# Patient Record
Sex: Female | Born: 1990
Health system: Southern US, Community
[De-identification: ages and names within clinical notes are randomized; demographics above are authoritative.]

## PROBLEM LIST (undated history)

## (undated) DIAGNOSIS — I300 Acute nonspecific idiopathic pericarditis: Secondary | ICD-10-CM

## (undated) DIAGNOSIS — I38 Endocarditis, valve unspecified: Secondary | ICD-10-CM

## (undated) DIAGNOSIS — M329 Systemic lupus erythematosus, unspecified: Secondary | ICD-10-CM

## (undated) DIAGNOSIS — J90 Pleural effusion, not elsewhere classified: Secondary | ICD-10-CM

## (undated) DIAGNOSIS — I3139 Other pericardial effusion (noninflammatory): Secondary | ICD-10-CM

## (undated) HISTORY — DX: Pleural effusion, not elsewhere classified: J90

## (undated) HISTORY — DX: Other pericardial effusion (noninflammatory): I31.39

## (undated) HISTORY — DX: Acute nonspecific idiopathic pericarditis: I30.0

## (undated) HISTORY — DX: Systemic lupus erythematosus, unspecified: M32.9

## (undated) HISTORY — DX: Endocarditis, valve unspecified: I38

---
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
## 2009-05-22 ENCOUNTER — Inpatient Hospital Stay (HOSPITAL_COMMUNITY): Admission: AD | Admit: 2009-05-22 | Discharge: 2009-05-23 | Payer: Self-pay | Admitting: Obstetrics and Gynecology

## 2009-06-21 ENCOUNTER — Inpatient Hospital Stay (HOSPITAL_COMMUNITY): Admission: AD | Admit: 2009-06-21 | Discharge: 2009-06-23 | Payer: Self-pay | Admitting: Obstetrics and Gynecology

## 2010-05-28 LAB — CBC
HCT: 34.8 % — ABNORMAL LOW (ref 36.0–46.0)
Hemoglobin: 11.8 g/dL — ABNORMAL LOW (ref 12.0–15.0)
MCHC: 33.9 g/dL (ref 30.0–36.0)
MCV: 82.4 fL (ref 78.0–100.0)
RBC: 4.22 MIL/uL (ref 3.87–5.11)

## 2010-05-29 LAB — CBC
MCHC: 32.9 g/dL (ref 30.0–36.0)
MCV: 81.8 fL (ref 78.0–100.0)
Platelets: 215 10*3/uL (ref 150–400)
RBC: 4.84 MIL/uL (ref 3.87–5.11)

## 2010-05-29 LAB — RPR: RPR Ser Ql: NONREACTIVE

## 2010-06-03 LAB — URINALYSIS, ROUTINE W REFLEX MICROSCOPIC
Bilirubin Urine: NEGATIVE
Ketones, ur: NEGATIVE mg/dL
Nitrite: NEGATIVE
pH: 7 (ref 5.0–8.0)

## 2010-06-03 LAB — URINE CULTURE: Colony Count: 8000

## 2010-06-03 LAB — URINE MICROSCOPIC-ADD ON

## 2010-06-14 LAB — POCT PREGNANCY, URINE: Preg Test, Ur: POSITIVE

## 2011-02-12 ENCOUNTER — Emergency Department (HOSPITAL_COMMUNITY)
Admission: EM | Admit: 2011-02-12 | Discharge: 2011-02-12 | Discharge status: Against Medical Advice | Payer: Medicaid Other | Attending: Emergency Medicine | Admitting: Emergency Medicine

## 2011-02-12 ENCOUNTER — Encounter: Payer: Self-pay | Admitting: Emergency Medicine

## 2011-02-12 DIAGNOSIS — M25559 Pain in unspecified hip: Secondary | ICD-10-CM | POA: Insufficient documentation

## 2011-02-12 NOTE — ED Notes (Addendum)
Pt reports that she purposefully sat on left foot yesterday and heard her left hip make a "pop" noise. Pt reports she has a limp today and 8/10 pain in left anterior inguinal area. Pt is ambulatory WNL. No apparent distress.

## 2011-02-14 NOTE — ED Provider Notes (Signed)
History     CSN: [id number] Arrival date & time: 02/12/2011  3:50 PM   First MD Initiated Contact with Patient 02/12/11 1823      Chief Complaint  Patient presents with  . Hip Pain    (Consider location/radiation/quality/duration/timing/severity/associated sxs/prior treatment) HPI  History reviewed. No pertinent past medical history.  History reviewed. No pertinent past surgical history.  No family history on file.  History  Substance Use Topics  . Smoking status: Never Smoker   . Smokeless tobacco: Not on file  . Alcohol Use: No    OB History    Grav Para Term Preterm Abortions TAB SAB Ect Mult Living                  Review of Systems  Allergies  Review of patient's allergies indicates no known allergies.  Home Medications   Current Outpatient Rx  Name Route Sig Dispense Refill  . ACETAMINOPHEN 325 MG PO TABS Oral Take 650 mg by mouth every 6 (six) hours as needed. For pain.     . NORELGESTROMIN-ETH ESTRADIOL 150-20 MCG/24HR TD PTWK Transdermal Place 1 patch onto the skin once a week.        BP 111/65  Pulse 81  Temp(Src) 98.3 F (36.8 C) (Oral)  Resp 17  SpO2 99%  LMP 01/25/2011  Physical Exam  ED Course  Procedures (including critical care time)  Labs Reviewed - No data to display No results found.   1. Hip pain       MDM  Left before seen         Arman Filter, NP 02/14/11 4098  Arman Filter, NP 02/14/11 [phone]

## 2011-02-17 NOTE — ED Provider Notes (Signed)
Medical screening examination/treatment/procedure(s) were performed by non-physician practitioner and as supervising physician I was immediately available for consultation/collaboration.  Olivia Mackie, MD 02/17/11 [phone]

## 2012-07-22 ENCOUNTER — Encounter (HOSPITAL_BASED_OUTPATIENT_CLINIC_OR_DEPARTMENT_OTHER): Payer: Self-pay

## 2012-07-22 ENCOUNTER — Emergency Department (HOSPITAL_BASED_OUTPATIENT_CLINIC_OR_DEPARTMENT_OTHER)
Admission: EM | Admit: 2012-07-22 | Discharge: 2012-07-22 | Disposition: A | Discharge status: Home / Self Care | Payer: Medicaid Other | Attending: Emergency Medicine | Admitting: Emergency Medicine

## 2012-07-22 DIAGNOSIS — Z3202 Encounter for pregnancy test, result negative: Secondary | ICD-10-CM | POA: Insufficient documentation

## 2012-07-22 DIAGNOSIS — N390 Urinary tract infection, site not specified: Secondary | ICD-10-CM | POA: Insufficient documentation

## 2012-07-22 LAB — URINE MICROSCOPIC-ADD ON

## 2012-07-22 LAB — URINALYSIS, ROUTINE W REFLEX MICROSCOPIC
Bilirubin Urine: NEGATIVE
Glucose, UA: NEGATIVE mg/dL
Hgb urine dipstick: NEGATIVE
Ketones, ur: NEGATIVE mg/dL
pH: 6 (ref 5.0–8.0)

## 2012-07-22 MED ORDER — CEPHALEXIN 500 MG PO CAPS: 500.0000 mg | ORAL_CAPSULE | Freq: Four times a day (QID) | ORAL | Status: DC

## 2012-07-22 NOTE — ED Provider Notes (Signed)
Medical screening examination/treatment/procedure(s) were performed by non-physician practitioner and as supervising physician I was immediately available for consultation/collaboration.   Gwyneth Sprout, MD 07/22/12 1929

## 2012-07-22 NOTE — ED Provider Notes (Signed)
History     CSN: [id number]  Arrival date & time 07/22/12  1634   First MD Initiated Contact with Patient 07/22/12 1638      Chief Complaint  Patient presents with  . Abdominal Pain    (Consider location/radiation/quality/duration/timing/severity/associated sxs/prior treatment) HPI Comments: Urinary frequency  Patient is a 22 y.o. female presenting with abdominal pain. The history is provided by the patient. No language interpreter was used.  Abdominal Pain Pain location:  Suprapubic Pain quality: aching   Pain radiates to:  Does not radiate Pain severity:  Mild Onset quality:  Gradual Duration:  2 days Timing:  Constant Progression:  Unchanged Chronicity:  New Relieved by:  Nothing Worsened by:  Nothing tried Associated symptoms: no hematuria, no vaginal bleeding and no vaginal discharge     History reviewed. No pertinent past medical history.  History reviewed. No pertinent past surgical history.  No family history on file.  History  Substance Use Topics  . Smoking status: Never Smoker   . Smokeless tobacco: Not on file  . Alcohol Use: No    OB History   Grav Para Term Preterm Abortions TAB SAB Ect Mult Living                  Review of Systems  Constitutional: Negative.   Respiratory: Negative.   Cardiovascular: Negative.   Gastrointestinal: Positive for abdominal pain.  Genitourinary: Negative for hematuria, vaginal bleeding and vaginal discharge.    Allergies  Review of patient's allergies indicates no known allergies.  Home Medications   Current Outpatient Rx  Name  Route  Sig  Dispense  Refill  . acetaminophen (TYLENOL) 325 MG tablet   Oral   Take 650 mg by mouth every 6 (six) hours as needed. For pain.          Marland Kitchen norelgestromin-ethinyl estradiol (ORTHO EVRA) 150-20 MCG/24HR transdermal patch   Transdermal   Place 1 patch onto the skin once a week.             BP 131/70  Pulse 74  Temp(Src) 99 F (37.2 C) (Oral)  Resp 16  Ht  5\' 6"  (1.676 m)  Wt 155 lb (70.308 kg)  BMI 25.03 kg/m2  SpO2 100%  LMP 07/04/2012  Physical Exam  Nursing note and vitals reviewed. Constitutional: She is oriented to person, place, and time. She appears well-developed and well-nourished.  Cardiovascular: Normal rate and regular rhythm.   Pulmonary/Chest: Effort normal and breath sounds normal.  Abdominal: Soft. Bowel sounds are normal. There is tenderness in the suprapubic area.  Musculoskeletal: Normal range of motion.  Neurological: She is alert and oriented to person, place, and time.  Skin: Skin is dry.    ED Course  Procedures (including critical care time)  Labs Reviewed  URINALYSIS, ROUTINE W REFLEX MICROSCOPIC - Abnormal; Notable for the following:    APPearance CLOUDY (*)    Leukocytes, UA SMALL (*)    All other components within normal limits  URINE MICROSCOPIC-ADD ON - Abnormal; Notable for the following:    Squamous Epithelial / LPF MANY (*)    Bacteria, UA MANY (*)    All other components within normal limits  PREGNANCY, URINE   No results found.   1. UTI (lower urinary tract infection)       MDM  Will treat for a simple uti        Teressa Lower, NP 07/22/12 1722

## 2012-07-22 NOTE — ED Notes (Signed)
C/o lower abd pain x 2 days-urinary freq

## 2012-07-23 LAB — URINE CULTURE

## 2013-08-21 ENCOUNTER — Encounter (HOSPITAL_BASED_OUTPATIENT_CLINIC_OR_DEPARTMENT_OTHER): Payer: Self-pay | Admitting: Emergency Medicine

## 2013-08-21 ENCOUNTER — Emergency Department (HOSPITAL_BASED_OUTPATIENT_CLINIC_OR_DEPARTMENT_OTHER)
Admission: EM | Admit: 2013-08-21 | Discharge: 2013-08-21 | Disposition: A | Discharge status: Home / Self Care | Payer: Medicaid Other | Attending: Emergency Medicine | Admitting: Emergency Medicine

## 2013-08-21 DIAGNOSIS — Z792 Long term (current) use of antibiotics: Secondary | ICD-10-CM | POA: Insufficient documentation

## 2013-08-21 DIAGNOSIS — M545 Low back pain, unspecified: Secondary | ICD-10-CM | POA: Insufficient documentation

## 2013-08-21 MED ORDER — MELOXICAM 7.5 MG PO TABS: 7.5000 mg | ORAL_TABLET | Freq: Every day | ORAL | Status: DC

## 2013-08-21 MED ORDER — IBUPROFEN 200 MG PO TABS
600.0000 mg | ORAL_TABLET | Freq: Once | ORAL | Status: AC
  Administered 2013-08-21: 600 mg via ORAL
  Filled 2013-08-21 (×2): qty 1

## 2013-08-21 MED ORDER — CYCLOBENZAPRINE HCL 5 MG PO TABS: 5.0000 mg | ORAL_TABLET | Freq: Three times a day (TID) | ORAL | Status: DC | PRN

## 2013-08-21 NOTE — ED Notes (Signed)
Pt awoke this AM with back pain denies event or injury

## 2013-08-21 NOTE — Discharge Instructions (Signed)
Take the medication as directed. Do not take the muscle relaxant if you are driving or working as it will make you sleepy.

## 2013-08-21 NOTE — ED Provider Notes (Signed)
CSN: [id number]     Arrival date & time 08/21/13  2034 History   First MD Initiated Contact with Patient 08/21/13 2219     Chief Complaint  Patient presents with  . Back Pain     (Consider location/radiation/quality/duration/timing/severity/associated sxs/prior Treatment) Patient is a 23 y.o. female presenting with back pain. The history is provided by the patient.  Back Pain Location:  Lumbar spine Quality:  Aching Pain severity:  Moderate Pain is:  Same all the time Onset quality:  Gradual Duration:  1 day Timing:  Constant Progression:  Worsening Chronicity:  New Relieved by:  Being still and OTC medications Worsened by:  Ambulation, movement and bending Associated symptoms: no bladder incontinence and no bowel incontinence    Tiffany Herrera is a 23 y.o. female who presents to the ED with low back pain that she first noticed last night. Today the pain has increased. She states that since she was pregnant and had an epidural she has had some pain off and on. She usually just takes tylenol and the pain goes away. This time she took tylenol and the pain got better for a while but then came back.   History reviewed. No pertinent past medical history. History reviewed. No pertinent past surgical history. History reviewed. No pertinent family history. History  Substance Use Topics  . Smoking status: Never Smoker   . Smokeless tobacco: Not on file  . Alcohol Use: No   OB History   Grav Para Term Preterm Abortions TAB SAB Ect Mult Living                 Review of Systems  Gastrointestinal: Negative for bowel incontinence.  Genitourinary: Negative for bladder incontinence.  Musculoskeletal: Positive for back pain.  All other systems negative    Allergies  Review of patient's allergies indicates no known allergies.  Home Medications   Prior to Admission medications   Medication Sig Start Date End Date Taking? Authorizing Provider  acetaminophen (TYLENOL) 325 MG  tablet Take 650 mg by mouth every 6 (six) hours as needed. For pain.     Historical Provider, MD  cephALEXin (KEFLEX) 500 MG capsule Take 1 capsule (500 mg total) by mouth 4 (four) times daily. 07/22/12   Teressa LowerVrinda Pickering, NP  norelgestromin-ethinyl estradiol (ORTHO EVRA) 150-20 MCG/24HR transdermal patch Place 1 patch onto the skin once a week.      Historical Provider, MD   BP 104/69  Pulse 62  Temp(Src) 98 F (36.7 C) (Oral)  Resp 18  SpO2 95%  LMP 08/18/2013 Physical Exam  Nursing note and vitals reviewed. Constitutional: She is oriented to person, place, and time. She appears well-developed and well-nourished. No distress.  HENT:  Head: Normocephalic and atraumatic.  Right Ear: Tympanic membrane normal.  Left Ear: Tympanic membrane normal.  Nose: Nose normal.  Mouth/Throat: Uvula is midline, oropharynx is clear and moist and mucous membranes are normal.  Eyes: EOM are normal.  Neck: Normal range of motion. Neck supple.  Cardiovascular: Normal rate and regular rhythm.   Pulmonary/Chest: Effort normal. She has no wheezes. She has no rales.  Abdominal: Soft. Bowel sounds are normal. There is no tenderness.  Musculoskeletal: Normal range of motion.       Lumbar back: She exhibits tenderness (mild) and spasm. She exhibits normal range of motion and normal pulse.  Pedal pulses strong and equal bilateral. Full range of motion of back without difficulty. Ambulatory with steady gait, no foot drag.   Neurological: She is  alert and oriented to person, place, and time. She has normal strength. No cranial nerve deficit or sensory deficit. Gait normal.  Reflex Scores:      Bicep reflexes are 2+ on the right side and 2+ on the left side.      Brachioradialis reflexes are 2+ on the right side and 2+ on the left side.      Patellar reflexes are 2+ on the right side and 2+ on the left side.      Achilles reflexes are 2+ on the right side and 2+ on the left side. Skin: Skin is warm and dry.   Psychiatric: She has a normal mood and affect. Her behavior is normal.    ED Course  Procedures   MDM  23 y.o. female with low back pain that started last night. Stable for discharge without neuro deficits. Work note given for work missed today. She will follow up with ortho is symptoms worsen.  Discussed with the patient and all questioned fully answered. She voices understanding.    Medication List    TAKE these medications       cyclobenzaprine 5 MG tablet  Commonly known as:  FLEXERIL  Take 1 tablet (5 mg total) by mouth 3 (three) times daily as needed for muscle spasms.     meloxicam 7.5 MG tablet  Commonly known as:  MOBIC  Take 1 tablet (7.5 mg total) by mouth daily.      ASK your doctor about these medications       acetaminophen 325 MG tablet  Commonly known as:  TYLENOL  Take 650 mg by mouth every 6 (six) hours as needed. For pain.     cephALEXin 500 MG capsule  Commonly known as:  KEFLEX  Take 1 capsule (500 mg total) by mouth 4 (four) times daily.     norelgestromin-ethinyl estradiol 150-35 MCG/24HR transdermal patch  Commonly known as:  ORTHO EVRA  Place 1 patch onto the skin once a week.         Janne NapoleonHope M Rhetta Cleek, [place]NP 08/21/13 2241

## 2013-08-22 NOTE — ED Provider Notes (Signed)
Medical screening examination/treatment/procedure(s) were performed by non-physician practitioner and as supervising physician I was immediately available for consultation/collaboration.   EKG Interpretation None        Tiffany Herrera S Sidhant Helderman, MD 08/22/13 1522 

## 2014-07-12 ENCOUNTER — Encounter (HOSPITAL_BASED_OUTPATIENT_CLINIC_OR_DEPARTMENT_OTHER): Payer: Self-pay | Admitting: *Deleted

## 2014-07-12 ENCOUNTER — Emergency Department (HOSPITAL_BASED_OUTPATIENT_CLINIC_OR_DEPARTMENT_OTHER)
Admission: EM | Admit: 2014-07-12 | Discharge: 2014-07-13 | Disposition: A | Discharge status: Home / Self Care | Payer: Medicaid Other | Attending: Emergency Medicine | Admitting: Emergency Medicine

## 2014-07-12 DIAGNOSIS — M545 Low back pain, unspecified: Secondary | ICD-10-CM

## 2014-07-12 DIAGNOSIS — Y92410 Unspecified street and highway as the place of occurrence of the external cause: Secondary | ICD-10-CM | POA: Insufficient documentation

## 2014-07-12 DIAGNOSIS — Y9389 Activity, other specified: Secondary | ICD-10-CM | POA: Insufficient documentation

## 2014-07-12 DIAGNOSIS — Y998 Other external cause status: Secondary | ICD-10-CM | POA: Insufficient documentation

## 2014-07-12 DIAGNOSIS — S3992XA Unspecified injury of lower back, initial encounter: Secondary | ICD-10-CM | POA: Insufficient documentation
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
## 2014-07-13 MED ORDER — IBUPROFEN 600 MG PO TABS: 600.0000 mg | ORAL_TABLET | Freq: Four times a day (QID) | ORAL | Status: DC | PRN

## 2014-07-13 MED ORDER — HYDROCODONE-ACETAMINOPHEN 5-325 MG PO TABS
1.0000 | ORAL_TABLET | Freq: Once | ORAL | Status: AC
Start: 2014-07-13 — End: 2014-07-13
  Administered 2014-07-13: 1 via ORAL
  Filled 2014-07-13: qty 1

## 2014-07-13 MED ORDER — HYDROCODONE-ACETAMINOPHEN 5-325 MG PO TABS: 1.0000 | ORAL_TABLET | Freq: Four times a day (QID) | ORAL | Status: DC | PRN

## 2014-07-13 NOTE — Discharge Instructions (Signed)

## 2014-07-26 ENCOUNTER — Encounter (HOSPITAL_BASED_OUTPATIENT_CLINIC_OR_DEPARTMENT_OTHER): Payer: Self-pay

## 2014-07-26 ENCOUNTER — Emergency Department (HOSPITAL_BASED_OUTPATIENT_CLINIC_OR_DEPARTMENT_OTHER)
Admission: EM | Admit: 2014-07-26 | Discharge: 2014-07-26 | Disposition: A | Discharge status: Home / Self Care | Payer: Medicaid Other | Attending: Emergency Medicine | Admitting: Emergency Medicine

## 2014-07-26 DIAGNOSIS — L089 Local infection of the skin and subcutaneous tissue, unspecified: Secondary | ICD-10-CM | POA: Insufficient documentation

## 2014-07-26 MED ORDER — CEPHALEXIN 500 MG PO CAPS: 500.0000 mg | ORAL_CAPSULE | Freq: Four times a day (QID) | ORAL | Status: DC

## 2014-07-26 NOTE — Discharge Instructions (Signed)
Rash A rash is a change in the color or feel of your skin. There are many different types of rashes. You may have other problems along with your rash. HOME CARE  Avoid the thing that caused your rash.  Do not scratch your rash.  You may take cools baths to help stop itching.  Only take medicines as told by your doctor.  Keep all doctor visits as told. GET HELP RIGHT AWAY IF:   Your pain, puffiness (swelling), or redness gets worse.  You have a fever.  You have new or severe problems.  You have body aches, watery poop (diarrhea), or you throw up (vomit).  Your rash is not better after 3 days. MAKE SURE YOU:   Understand these instructions.  Will watch your condition.  Will get help right away if you are not doing well or get worse. Document Released: 08/13/2007 Document Revised: 05/19/2011 Document Reviewed: 12/09/2010 Exit[employer] Patient Information 2015 [employer]. This information is not intended to replace advice given to you by your health care provider. Make sure you discuss any questions you have with your health care provider.  

## 2014-07-26 NOTE — ED Provider Notes (Signed)
CSN: [id number]     Arrival date & time 07/26/14  1717 History   First MD Initiated Contact with Patient 07/26/14 1721     Chief Complaint  Patient presents with  . Rash     (Consider location/radiation/quality/duration/timing/severity/associated sxs/prior Treatment) HPI Comments: Pt complaining of rash to the back of legs bilaterally. Started several day ago and the area on the left leg is starting to get red a hot although the left leg seems to be getting better. No drainage. No fever.   The history is provided by the patient. No language interpreter was used.    History reviewed. No pertinent past medical history. History reviewed. No pertinent past surgical history. No family history on file. History  Substance Use Topics  . Smoking status: Never Smoker   . Smokeless tobacco: Not on file  . Alcohol Use: No   OB History    No data available     Review of Systems  All other systems reviewed and are negative.     Allergies  Review of patient's allergies indicates no known allergies.  Home Medications   Prior to Admission medications   Medication Sig Start Date End Date Taking? Authorizing Provider  acetaminophen (TYLENOL) 325 MG tablet Take 650 mg by mouth every 6 (six) hours as needed. For pain.     Historical Provider, MD  cephALEXin (KEFLEX) 500 MG capsule Take 1 capsule (500 mg total) by mouth 4 (four) times daily. 07/26/14   Teressa LowerVrinda Nikko Goldwire, NP  HYDROcodone-acetaminophen (NORCO/VICODIN) 5-325 MG per tablet Take 1 tablet by mouth every 6 (six) hours as needed for moderate pain. 07/13/14   [name] Horton, MD  ibuprofen (ADVIL,MOTRIN) 600 MG tablet Take 1 tablet (600 mg total) by mouth every 6 (six) hours as needed. 07/13/14   [name] Horton, MD   BP 113/70 mmHg  Pulse 63  Temp(Src) 98.2 F (36.8 C) (Oral)  Resp 14  Ht 5\' 6"  (1.676 m)  Wt 186 lb (84.369 kg)  BMI 30.04 kg/m2  SpO2 100%  LMP 07/19/2014 Physical Exam  Constitutional: She is oriented to  person, place, and time. She appears well-developed and well-nourished.  Cardiovascular: Normal rate and regular rhythm.   Pulmonary/Chest: Effort normal and breath sounds normal.  Abdominal: Soft. Bowel sounds are normal. There is no tenderness.  Musculoskeletal: Normal range of motion.  Neurological: She is alert and oriented to person, place, and time.  Skin:  Area of redness and warmth noted to the right upper lower leg. One spot noted on the left leg. No fluctuance of firmness.  Psychiatric: She has a normal mood and affect.  Nursing note and vitals reviewed.   ED Course  Procedures (including critical care time) Labs Review Labs Reviewed - No data to display  Imaging Review No results found.   EKG Interpretation None      MDM   Final diagnoses:  Skin infection    Pt instructed on use of benadryl. Given keflex for possible early infection. No definite abscess noted    Teressa LowerVrinda Loui [place], NP 07/26/14 1749  Richardean Canalavid H Yao, MD 07/26/14 [phone]

## 2014-07-26 NOTE — ED Notes (Signed)
Reports rash to back of bilateral legs

## 2014-09-19 ENCOUNTER — Encounter (HOSPITAL_BASED_OUTPATIENT_CLINIC_OR_DEPARTMENT_OTHER): Payer: Self-pay | Admitting: Emergency Medicine

## 2014-09-19 ENCOUNTER — Emergency Department (HOSPITAL_BASED_OUTPATIENT_CLINIC_OR_DEPARTMENT_OTHER)
Admission: EM | Admit: 2014-09-19 | Discharge: 2014-09-19 | Disposition: A | Discharge status: Home / Self Care | Payer: Medicaid Other | Attending: Emergency Medicine | Admitting: Emergency Medicine

## 2014-09-19 DIAGNOSIS — Z792 Long term (current) use of antibiotics: Secondary | ICD-10-CM | POA: Insufficient documentation

## 2014-09-19 DIAGNOSIS — L509 Urticaria, unspecified: Secondary | ICD-10-CM | POA: Insufficient documentation

## 2014-09-19 MED ORDER — PREDNISONE 10 MG PO TABS: 20.0000 mg | ORAL_TABLET | Freq: Two times a day (BID) | ORAL | Status: DC

## 2014-09-19 NOTE — ED Notes (Signed)
Patient states that she has had an outbreak of a rash in different areas since Thursday. Reports that it goes away intermittently and then pops up again in another spot on her body. Today she is concerned because it is on her lip

## 2014-09-19 NOTE — ED Provider Notes (Signed)
CSN: [id number]     Arrival date & time 09/19/14  1823 History  This chart was scribed for Geoffery Lyons, MD by Abel Presto, ED Scribe. This patient was seen in room MH12/MH12 and the patient's care was started at 6:56 PM.    Chief Complaint  Patient presents with  . Rash     The history is provided by the patient. No language interpreter was used.   HPI Comments: Tiffany Herrera is a 24 y.o. female who presents to the Emergency Department complaining of intermittent pruritic urticaria-like rash with first onset 5 days ago. She states rash occurs in different places each time. She reports mild globus sensation with PO intake. Pt has tried Benadryl with brief relief. She denies suspicious food intake, changes in detergents or soaps, and new medication. Pt denies SOB and difficulty swallowing.   History reviewed. No pertinent past medical history. History reviewed. No pertinent past surgical history. History reviewed. No pertinent family history. History  Substance Use Topics  . Smoking status: Never Smoker   . Smokeless tobacco: Not on file  . Alcohol Use: No   OB History    No data available     Review of Systems  Skin: Positive for rash.  A complete 10 system review of systems was obtained and all systems are negative except as noted in the HPI and PMH.      Allergies  Review of patient's allergies indicates no known allergies.  Home Medications   Prior to Admission medications   Medication Sig Start Date End Date Taking? Authorizing Provider  acetaminophen (TYLENOL) 325 MG tablet Take 650 mg by mouth every 6 (six) hours as needed. For pain.     Historical Provider, MD  cephALEXin (KEFLEX) 500 MG capsule Take 1 capsule (500 mg total) by mouth 4 (four) times daily. 07/26/14   Teressa Lower, NP  HYDROcodone-acetaminophen (NORCO/VICODIN) 5-325 MG per tablet Take 1 tablet by mouth every 6 (six) hours as needed for moderate pain. 07/13/14   Shon Baton, MD  ibuprofen  (ADVIL,MOTRIN) 600 MG tablet Take 1 tablet (600 mg total) by mouth every 6 (six) hours as needed. 07/13/14   Shon Baton, MD   BP 124/77 mmHg  Pulse 78  Temp(Src) 98.9 F (37.2 C) (Oral)  Resp 16  Ht  (1.676 m)  Wt 188 lb (85.276 kg)  BMI 30.36 kg/m2  SpO2 100%  LMP 09/10/2014 Physical Exam  Constitutional: She is oriented to person, place, and time. She appears well-developed and well-nourished. No distress.  HENT:  Head: Normocephalic and atraumatic.  Eyes: EOM are normal.  Neck: Normal range of motion.  Cardiovascular: Normal rate, regular rhythm and normal heart sounds.   Pulmonary/Chest: Effort normal and breath sounds normal. No respiratory distress. She has no wheezes.  Abdominal: Soft. She exhibits no distension. There is no tenderness.  Musculoskeletal: Normal range of motion.  Neurological: She is alert and oriented to person, place, and time.  Skin: Skin is warm and dry.  There are multiple urticarial lesions to the arms, legs, and torso  Psychiatric: She has a normal mood and affect. Judgment normal.  Nursing note and vitals reviewed.   ED Course  Procedures (including critical care time) DIAGNOSTIC STUDIES: Oxygen Saturation is 100% on room air, normal by my interpretation.    COORDINATION OF CARE: 7:03 PM Discussed treatment plan with patient at beside, the patient agrees with the plan and has no further questions at this time.   Labs Review  Labs Reviewed - No data to display  Imaging Review No results found.   EKG Interpretation None      MDM   Final diagnoses:  Urticaria    Urticaria. We'll treat with prednisone and Benadryl. To return as needed.   I personally performed the services described in this documentation, which was scribed in my presence. The recorded information has been reviewed and is accurate.      Geoffery Lyonsouglas Alizee Maple, MD 09/19/14 [phone]

## 2014-09-19 NOTE — Discharge Instructions (Signed)
°  Prednisone as prescribed.  Benadryl 25 mg every 6 hours for the next 3 days.  Return to the emergency department if he developed difficulty breathing, swallowing, or other new and concerning symptoms.   Hives Hives are itchy, red, swollen areas of the skin. They can vary in size and location on your body. Hives can come and go for hours or several days (acute hives) or for several weeks (chronic hives). Hives do not spread from person to person (noncontagious). They may get worse with scratching, exercise, and emotional stress. CAUSES   Allergic reaction to food, additives, or drugs.  Infections, including the common cold.  Illness, such as vasculitis, lupus, or thyroid disease.  Exposure to sunlight, heat, or cold.  Exercise.  Stress.  Contact with chemicals. SYMPTOMS   Red or white swollen patches on the skin. The patches may change size, shape, and location quickly and repeatedly.  Itching.  Swelling of the hands, feet, and face. This may occur if hives develop deeper in the skin. DIAGNOSIS  Your caregiver can usually tell what is wrong by performing a physical exam. Skin or blood tests may also be done to determine the cause of your hives. In some cases, the cause cannot be determined. TREATMENT  Mild cases usually get better with medicines such as antihistamines. Severe cases may require an emergency epinephrine injection. If the cause of your hives is known, treatment includes avoiding that trigger.  HOME CARE INSTRUCTIONS   Avoid causes that trigger your hives.  Take antihistamines as directed by your caregiver to reduce the severity of your hives. Non-sedating or low-sedating antihistamines are usually recommended. Do not drive while taking an antihistamine.  Take any other medicines prescribed for itching as directed by your caregiver.  Wear loose-fitting clothing.  Keep all follow-up appointments as directed by your caregiver. SEEK MEDICAL CARE IF:   You  have persistent or severe itching that is not relieved with medicine.  You have painful or swollen joints. SEEK IMMEDIATE MEDICAL CARE IF:   You have a fever.  Your tongue or lips are swollen.  You have trouble breathing or swallowing.  You feel tightness in the throat or chest.  You have abdominal pain. These problems may be the first sign of a life-threatening allergic reaction. Call your local emergency services (911 in [place] MAKE SURE YOU:   Understand these instructions.  Will watch your condition.  Will get help right away if you are not doing well or get worse. Document Released: 02/24/2005 Document Revised: 03/01/2013 Document Reviewed: 05/20/2011 [facility] Patient Information 2015 [place]Care, [place]. This information is not intended to replace advice given to you by your health care provider. Make sure you discuss any questions you have with your health care provider.

## 2014-10-02 ENCOUNTER — Emergency Department (HOSPITAL_COMMUNITY)
Admission: EM | Admit: 2014-10-02 | Discharge: 2014-10-02 | Disposition: A | Discharge status: Home / Self Care | Payer: Medicaid Other | Attending: Emergency Medicine | Admitting: Emergency Medicine

## 2014-10-02 ENCOUNTER — Encounter (HOSPITAL_COMMUNITY): Payer: Self-pay | Admitting: Emergency Medicine

## 2014-10-02 DIAGNOSIS — Z7952 Long term (current) use of systemic steroids: Secondary | ICD-10-CM | POA: Insufficient documentation

## 2014-10-02 DIAGNOSIS — R21 Rash and other nonspecific skin eruption: Secondary | ICD-10-CM

## 2014-10-02 DIAGNOSIS — Z79899 Other long term (current) drug therapy: Secondary | ICD-10-CM | POA: Insufficient documentation

## 2014-10-02 DIAGNOSIS — Z792 Long term (current) use of antibiotics: Secondary | ICD-10-CM | POA: Insufficient documentation

## 2014-10-02 MED ORDER — CETIRIZINE HCL 10 MG PO TABS: 10.0000 mg | ORAL_TABLET | Freq: Every day | ORAL | Status: DC

## 2014-10-02 NOTE — ED Provider Notes (Signed)
CSN: [id number]     Arrival date & time 10/02/14  1149 History   This chart was scribed for non-physician practitioner, Everlene Farrier, PA-C, working with Linwood Dibbles, MD by Charline Bills, ED Scribe. This patient was seen in room WTR9/WTR9 and the patient's care was started at 1:28 PM.  Chief Complaint  Patient presents with  . Rash   The history is provided by the patient. No language interpreter was used.   HPI Comments: Tiffany Herrera is a 24 y.o. female who presents to the Emergency Department complaining of intermittent pruritic rash for the past 3 weeks. Pt states that urticaria returned last night to bilateral hands and abdomen that resolved with Benadryl around 330 am.  She denies diaphoresis and feeling hot when she develops the rash. Pt was seen in the ED 13 days ago for same; discharged with Prednisone and Benadryl. Pt denies current rash, itching, SOB, coughing, wheezing, lip swelling, tongue swelling, difficulty swallowing, sore throat, abdominal pain, nausea, vomiting, rhinorrhea, nasal congestion, fevers, chills, wounds. She also denies new soaps, lotions, perfumes, detergents, bedding, medication changes, exposure to new foods, exposure to plants or animals.   History reviewed. No pertinent past medical history. History reviewed. No pertinent past surgical history. No family history on file. History  Substance Use Topics  . Smoking status: Never Smoker   . Smokeless tobacco: Not on file  . Alcohol Use: No   OB History    No data available     Review of Systems  Constitutional: Negative for fever and chills.  HENT: Negative for ear discharge, facial swelling, rhinorrhea, sneezing, sore throat and trouble swallowing.   Eyes: Negative for pain.  Respiratory: Negative for cough, shortness of breath and wheezing.   Gastrointestinal: Negative for nausea, vomiting and abdominal pain.  Musculoskeletal: Negative for myalgias.  Skin: Positive for rash. Negative for wound.    Allergies  Review of patient's allergies indicates no known allergies.  Home Medications   Prior to Admission medications   Medication Sig Start Date End Date Taking? Authorizing Provider  acetaminophen (TYLENOL) 325 MG tablet Take 650 mg by mouth every 6 (six) hours as needed. For pain.     Historical Provider, MD  cephALEXin (KEFLEX) 500 MG capsule Take 1 capsule (500 mg total) by mouth 4 (four) times daily. 07/26/14   Teressa Lower, NP  cetirizine (ZYRTEC ALLERGY) 10 MG tablet Take 1 tablet (10 mg total) by mouth daily. 10/02/14   Everlene Farrier, PA-C  HYDROcodone-acetaminophen (NORCO/VICODIN) 5-325 MG per tablet Take 1 tablet by mouth every 6 (six) hours as needed for moderate pain. 07/13/14   Shon Baton, MD  ibuprofen (ADVIL,MOTRIN) 600 MG tablet Take 1 tablet (600 mg total) by mouth every 6 (six) hours as needed. 07/13/14   Shon Baton, MD  predniSONE (DELTASONE) 10 MG tablet Take 2 tablets (20 mg total) by mouth 2 (two) times daily. 09/19/14   Geoffery Lyons, MD   BP 127/78 mmHg  Pulse 97  Temp(Src) 98.9 F (37.2 C) (Oral)  Resp 18  SpO2 100%  LMP 09/10/2014 Physical Exam  Constitutional: She appears well-developed and well-nourished. No distress.  nontoxic appearing.  HENT:  Head: Normocephalic and atraumatic.  Right Ear: External ear normal.  Left Ear: External ear normal.  Mouth/Throat: Oropharynx is clear and moist. No oropharyngeal exudate.  No posterior oropharyngeal erythema or edema. No tonsillar hypertrophy or exudates. No lip swelling.  Eyes: Conjunctivae are normal. Pupils are equal, round, and reactive to light. Right eye  exhibits no discharge. Left eye exhibits no discharge.  Neck: Neck supple.  Cardiovascular: Normal rate, regular rhythm, normal heart sounds and intact distal pulses.   Pulmonary/Chest: Effort normal and breath sounds normal. No respiratory distress. She has no wheezes. She has no rales.  Lungs are clear to auscultation bilaterally.   Abdominal: Soft. There is no tenderness. There is no guarding.  Musculoskeletal: She exhibits no edema or tenderness.  No lower extremity edema or tenderness.  Lymphadenopathy:    She has no cervical adenopathy.  Neurological: She is alert. Coordination normal.  Skin: Skin is warm and dry. No rash noted. She is not diaphoretic. No erythema. No pallor.  No evidence of any rash,wounds or hives on the patient's body.   Psychiatric: She has a normal mood and affect. Her behavior is normal.  Nursing note and vitals reviewed.  ED Course  Procedures (including critical care time) DIAGNOSTIC STUDIES: Oxygen Saturation is 100% on RA, normal by my interpretation.    COORDINATION OF CARE: 1:35 PM-Discussed treatment plan which includes Zyrtec with pt at bedside and pt agreed to plan.   Labs Review Labs Reviewed - No data to display  Imaging Review No results found.   EKG Interpretation None      Filed Vitals:   10/02/14 1154  BP: 127/78  Pulse: 97  Temp: 98.9 F (37.2 C)  TempSrc: Oral  Resp: 18  SpO2: 100%     MDM   Meds given in ED:  Medications - No data to display  Discharge Medication List as of 10/02/2014  1:36 PM    START taking these medications   Details  cetirizine (ZYRTEC ALLERGY) 10 MG tablet Take 1 tablet (10 mg total) by mouth daily., Starting 10/02/2014, Until Discontinued, Print        Final diagnoses:  Rash and nonspecific skin eruption   This is a 24 year old female who presents to the emergency department complaining of an intermittent generalized rash with hives ongoing for the past 3 weeks. Patient denies any current rash or itching. Patient reports she last had symptoms at 3:30 this morning. She reports taking Benadryl which relieved her symptoms. On exam patient has no current rash or hives.Patient denies any tongue swelling, lip swelling, difficulty breathing, shortness of breath, cough, wheezing, abdominal pain, nausea or vomiting. Patient is  unable to identify any aggravating factors. I advised the patient to keep a journal of when she has rash symptoms. We'll start her on Zyrtec and have her follow-up with her primary care provider. I advised the patient to follow-up with their primary care provider this week. I advised the patient to return to the emergency department with new or worsening symptoms or new concerns. The patient verbalized understanding and agreement with plan.     I personally performed the services described in this documentation, which was scribed in my presence. The recorded information has been reviewed and is accurate.     Everlene Farrier, PA-C 10/02/14 1355  Linwood Dibbles, MD 10/02/14 [phone]

## 2014-10-02 NOTE — ED Notes (Signed)
Pt c/o generalized rash last night at 0330 and took benadryl. At triage no rash seen, no point of entry. Denies SOB or encounters with insects.

## 2014-10-02 NOTE — Discharge Instructions (Signed)
Rash A rash is a change in the color or texture of your skin. There are many different types of rashes. You may have other problems that accompany your rash. CAUSES   Infections.  Allergic reactions. This can include allergies to pets or foods.  Certain medicines.  Exposure to certain chemicals, soaps, or cosmetics.  Heat.  Exposure to poisonous plants.  Tumors, both cancerous and noncancerous. SYMPTOMS   Redness.  Scaly skin.  Itchy skin.  Dry or cracked skin.  Bumps.  Blisters.  Pain. DIAGNOSIS  Your caregiver may do a physical exam to determine what type of rash you have. A skin sample (biopsy) may be taken and examined under a microscope. TREATMENT  Treatment depends on the type of rash you have. Your caregiver may prescribe certain medicines. For serious conditions, you may need to see a skin doctor (dermatologist). HOME CARE INSTRUCTIONS   Avoid the substance that caused your rash.  Do not scratch your rash. This can cause infection.  You may take cool baths to help stop itching.  Only take over-the-counter or prescription medicines as directed by your caregiver.  Keep all follow-up appointments as directed by your caregiver. SEEK IMMEDIATE MEDICAL CARE IF:  You have increasing pain, swelling, or redness.  You have a fever.  You have new or severe symptoms.  You have body aches, diarrhea, or vomiting.  Your rash is not better after 3 days. MAKE SURE YOU:  Understand these instructions.  Will watch your condition.  Will get help right away if you are not doing well or get worse. Document Released: 02/14/2002 Document Revised: 05/19/2011 Document Reviewed: 12/09/2010 [employer] Patient Information 2015 [employer]. This information is not intended to replace advice given to you by your health care provider. Make sure you discuss any questions you have with your health care provider.  Hives Hives are itchy, red, swollen areas of the skin. They  can vary in size and location on your body. Hives can come and go for hours or several days (acute hives) or for several weeks (chronic hives). Hives do not spread from person to person (noncontagious). They may get worse with scratching, exercise, and emotional stress. CAUSES   Allergic reaction to food, additives, or drugs.  Infections, including the common cold.  Illness, such as vasculitis, lupus, or thyroid disease.  Exposure to sunlight, heat, or cold.  Exercise.  Stress.  Contact with chemicals. SYMPTOMS   Red or white swollen patches on the skin. The patches may change size, shape, and location quickly and repeatedly.  Itching.  Swelling of the hands, feet, and face. This may occur if hives develop deeper in the skin. DIAGNOSIS  Your caregiver can usually tell what is wrong by performing a physical exam. Skin or blood tests may also be done to determine the cause of your hives. In some cases, the cause cannot be determined. TREATMENT  Mild cases usually get better with medicines such as antihistamines. Severe cases may require an emergency epinephrine injection. If the cause of your hives is known, treatment includes avoiding that trigger.  HOME CARE INSTRUCTIONS   Avoid causes that trigger your hives.  Take antihistamines as directed by your caregiver to reduce the severity of your hives. Non-sedating or low-sedating antihistamines are usually recommended. Do not drive while taking an antihistamine.  Take any other medicines prescribed for itching as directed by your caregiver.  Wear loose-fitting clothing.  Keep all follow-up appointments as directed by your caregiver. SEEK MEDICAL CARE IF:     You have persistent or severe itching that is not relieved with medicine.  You have painful or swollen joints. SEEK IMMEDIATE MEDICAL CARE IF:   You have a fever.  Your tongue or lips are swollen.  You have trouble breathing or swallowing.  You feel tightness in the  throat or chest.  You have abdominal pain. These problems may be the first sign of a life-threatening allergic reaction. Call your local emergency services (911 in [place] MAKE SURE YOU:   Understand these instructions.  Will watch your condition.  Will get help right away if you are not doing well or get worse. Document Released: 02/24/2005 Document Revised: 03/01/2013 Document Reviewed: 05/20/2011 Exit[employer] Patient Information 2015 [employer]. This information is not intended to replace advice given to you by your health care provider. Make sure you discuss any questions you have with your health care provider.  

## 2014-11-15 ENCOUNTER — Emergency Department (HOSPITAL_BASED_OUTPATIENT_CLINIC_OR_DEPARTMENT_OTHER)
Admission: EM | Admit: 2014-11-15 | Discharge: 2014-11-15 | Disposition: A | Discharge status: Home / Self Care | Payer: Medicaid Other | Attending: Emergency Medicine | Admitting: Emergency Medicine

## 2014-11-15 ENCOUNTER — Encounter (HOSPITAL_BASED_OUTPATIENT_CLINIC_OR_DEPARTMENT_OTHER): Payer: Self-pay | Admitting: *Deleted

## 2014-11-15 DIAGNOSIS — T7840XA Allergy, unspecified, initial encounter: Secondary | ICD-10-CM | POA: Insufficient documentation

## 2014-11-15 DIAGNOSIS — Y9389 Activity, other specified: Secondary | ICD-10-CM | POA: Insufficient documentation

## 2014-11-15 DIAGNOSIS — Z79899 Other long term (current) drug therapy: Secondary | ICD-10-CM | POA: Insufficient documentation

## 2014-11-15 DIAGNOSIS — Y9289 Other specified places as the place of occurrence of the external cause: Secondary | ICD-10-CM | POA: Insufficient documentation

## 2014-11-15 DIAGNOSIS — X58XXXA Exposure to other specified factors, initial encounter: Secondary | ICD-10-CM | POA: Insufficient documentation

## 2014-11-15 DIAGNOSIS — Y998 Other external cause status: Secondary | ICD-10-CM | POA: Insufficient documentation

## 2014-11-15 MED ORDER — FAMOTIDINE 20 MG PO TABS: 20.0000 mg | ORAL_TABLET | Freq: Two times a day (BID) | ORAL | Status: DC

## 2014-11-15 MED ORDER — FAMOTIDINE 20 MG PO TABS
20.0000 mg | ORAL_TABLET | Freq: Once | ORAL | Status: AC
  Administered 2014-11-15: 20 mg via ORAL
  Filled 2014-11-15: qty 1

## 2014-11-15 NOTE — ED Notes (Signed)
C/o rash and swelling to lower lip. Onset yesterday am. Took benadryl and got better and now is back. States she ate veggie chips which she had not eaten before. No other sx.

## 2014-11-15 NOTE — ED Provider Notes (Addendum)
CSN: [id number]     Arrival date & time 11/15/14  0911 History   First MD Initiated Contact with Patient 11/15/14 [phone]     No chief complaint on file.    (Consider location/radiation/quality/duration/timing/severity/associated sxs/prior Treatment) The history is provided by the patient.   patient with onset of lower lip swelling yesterday morning after eating some veggie chips. Thinks she may have had a reaction. Patient took some Benadryl with improvement. Patient denies any tongue swelling upper lip swelling trouble swallowing no wheezing no trouble breathing no skin rash. The lower lip is not sore and has no skin lesions.  History reviewed. No pertinent past medical history. History reviewed. No pertinent past surgical history. No family history on file. Social History  Substance Use Topics  . Smoking status: Never Smoker   . Smokeless tobacco: None  . Alcohol Use: No   OB History    No data available     Review of Systems  Constitutional: Negative for fever.  HENT: Positive for facial swelling. Negative for congestion, mouth sores, sore throat and trouble swallowing.   Respiratory: Negative for shortness of breath and wheezing.   Cardiovascular: Negative for chest pain.  Gastrointestinal: Negative for abdominal pain.  Musculoskeletal: Negative for joint swelling.  Skin: Negative for rash.  Neurological: Negative for headaches.  Hematological: Does not bruise/bleed easily.  Psychiatric/Behavioral: Negative for confusion.      Allergies  Review of patient's allergies indicates no known allergies.  Home Medications   Prior to Admission medications   Medication Sig Start Date End Date Taking? Authorizing Provider  cetirizine (ZYRTEC ALLERGY) 10 MG tablet Take 1 tablet (10 mg total) by mouth daily. 10/02/14  Yes Everlene Farrier, PA-C  famotidine (PEPCID) 20 MG tablet Take 1 tablet (20 mg total) by mouth 2 (two) times daily. 11/15/14   Vanetta Mulders, MD   BP 102/65 mmHg   Pulse 62  Temp(Src) 98.4 F (36.9 C) (Oral)  Resp 18  Ht 5\' 6"  (1.676 m)  Wt 190 lb (86.183 kg)  BMI 30.68 kg/m2  SpO2 87%  LMP 11/01/2014 Physical Exam  Constitutional: She is oriented to person, place, and time. She appears well-developed and well-nourished. No distress.  HENT:  Head: Normocephalic and atraumatic.  Mouth/Throat: Oropharynx is clear and moist.  Lower lip swelling. No swelling of the tongue no swelling of the soft palate uvula midline.  Eyes: Conjunctivae and EOM are normal. Pupils are equal, round, and reactive to light.  Neck: Normal range of motion. Neck supple. No tracheal deviation present.  Cardiovascular: Normal rate, regular rhythm and normal heart sounds.   No murmur heard. Pulmonary/Chest: Effort normal and breath sounds normal. No respiratory distress.  Abdominal: Soft. Bowel sounds are normal. There is no tenderness.  Musculoskeletal: Normal range of motion.  Neurological: She is alert and oriented to person, place, and time. No cranial nerve deficit. She exhibits normal muscle tone. Coordination normal.  Skin: Skin is warm.  Nursing note and vitals reviewed.   ED Course  Procedures (including critical care time) Labs Review Labs Reviewed - No data to display  Imaging Review No results found. I have personally reviewed and evaluated these images and lab results as part of my medical decision-making.   EKG Interpretation None      MDM   Final diagnoses:  Allergic reaction, initial encounter    Patient with lower lip swelling no swelling to the tongue or lip swelling no wheezing no respiratory compromise. No rash. May very well be  a food allergy. Patient already taking Zyrtec will have patient add Pepcid to the regimen.  She will return for any new or worse symptoms. Patient nontoxic no acute distress.    Vanetta Mulders, MD 11/15/14 1610  Vanetta Mulders, MD 12/07/14 [phone]

## 2014-11-15 NOTE — Discharge Instructions (Signed)
Continue the anti-histamines Zytec, take the Pepcid twice a day for the next 7 days. Return for any tongue swelling trouble breathing or worsening of the lip swelling.

## 2015-06-04 ENCOUNTER — Encounter: Payer: Self-pay | Admitting: Obstetrics and Gynecology

## 2015-10-17 ENCOUNTER — Emergency Department (HOSPITAL_BASED_OUTPATIENT_CLINIC_OR_DEPARTMENT_OTHER): Payer: Medicaid Other

## 2015-10-17 ENCOUNTER — Emergency Department (HOSPITAL_BASED_OUTPATIENT_CLINIC_OR_DEPARTMENT_OTHER)
Admission: EM | Admit: 2015-10-17 | Discharge: 2015-10-17 | Disposition: A | Discharge status: Home / Self Care | Payer: Medicaid Other | Attending: Emergency Medicine | Admitting: Emergency Medicine

## 2015-10-17 ENCOUNTER — Encounter (HOSPITAL_BASED_OUTPATIENT_CLINIC_OR_DEPARTMENT_OTHER): Payer: Self-pay

## 2015-10-17 DIAGNOSIS — R05 Cough: Secondary | ICD-10-CM | POA: Diagnosis present

## 2015-10-17 DIAGNOSIS — J069 Acute upper respiratory infection, unspecified: Secondary | ICD-10-CM | POA: Diagnosis not present

## 2015-10-17 LAB — CBC WITH DIFFERENTIAL/PLATELET
BASOS ABS: 0 10*3/uL (ref 0.0–0.1)
Basophils Relative: 0 %
EOS PCT: 0 %
Eosinophils Absolute: 0 10*3/uL (ref 0.0–0.7)
HEMATOCRIT: 37.1 % (ref 36.0–46.0)
HEMOGLOBIN: 12.7 g/dL (ref 12.0–15.0)
LYMPHS PCT: 35 %
Lymphs Abs: 1.5 10*3/uL (ref 0.7–4.0)
MCH: 27 pg (ref 26.0–34.0)
MCHC: 34.2 g/dL (ref 30.0–36.0)
MCV: 78.8 fL (ref 78.0–100.0)
Monocytes Absolute: 0.8 10*3/uL (ref 0.1–1.0)
Monocytes Relative: 19 %
NEUTROS ABS: 2 10*3/uL (ref 1.7–7.7)
NEUTROS PCT: 46 %
Platelets: 234 10*3/uL (ref 150–400)
RBC: 4.71 MIL/uL (ref 3.87–5.11)
RDW: 12.7 % (ref 11.5–15.5)
WBC: 4.3 10*3/uL (ref 4.0–10.5)

## 2015-10-17 LAB — URINALYSIS, ROUTINE W REFLEX MICROSCOPIC
BILIRUBIN URINE: NEGATIVE
GLUCOSE, UA: NEGATIVE mg/dL
Ketones, ur: NEGATIVE mg/dL
Nitrite: NEGATIVE
PH: 6.5 (ref 5.0–8.0)
Protein, ur: NEGATIVE mg/dL
SPECIFIC GRAVITY, URINE: 1.004 — AB (ref 1.005–1.030)

## 2015-10-17 LAB — PREGNANCY, URINE: Preg Test, Ur: NEGATIVE

## 2015-10-17 LAB — COMPREHENSIVE METABOLIC PANEL
ALK PHOS: 42 U/L (ref 38–126)
ALT: 15 U/L (ref 14–54)
AST: 21 U/L (ref 15–41)
Albumin: 3.2 g/dL — ABNORMAL LOW (ref 3.5–5.0)
Anion gap: 9 (ref 5–15)
BILIRUBIN TOTAL: 0.7 mg/dL (ref 0.3–1.2)
BUN: 8 mg/dL (ref 6–20)
CALCIUM: 8.8 mg/dL — AB (ref 8.9–10.3)
CO2: 24 mmol/L (ref 22–32)
CREATININE: 0.89 mg/dL (ref 0.44–1.00)
Chloride: 100 mmol/L — ABNORMAL LOW (ref 101–111)
GFR calc Af Amer: >60 mL/min (ref >60)
Glucose, Bld: 107 mg/dL — ABNORMAL HIGH (ref 65–99)
POTASSIUM: 3.6 mmol/L (ref 3.5–5.1)
Sodium: 133 mmol/L — ABNORMAL LOW (ref 135–145)
TOTAL PROTEIN: 7.8 g/dL (ref 6.5–8.1)

## 2015-10-17 LAB — URINE MICROSCOPIC-ADD ON

## 2015-10-17 LAB — LIPASE, BLOOD: LIPASE: 46 U/L (ref 11–51)

## 2015-10-17 MED ORDER — SODIUM CHLORIDE 0.9 % IV BOLUS (SEPSIS)
1000.0000 mL | Freq: Once | INTRAVENOUS | Status: AC
  Administered 2015-10-17: 1000 mL via INTRAVENOUS

## 2015-10-17 MED ORDER — HYDROCODONE-HOMATROPINE 5-1.5 MG/5ML PO SYRP: 5.0000 mL | ORAL_SOLUTION | Freq: Four times a day (QID) | ORAL | 0 refills | Status: DC | PRN

## 2015-10-17 MED ORDER — ACETAMINOPHEN 325 MG PO TABS
650.0000 mg | ORAL_TABLET | Freq: Once | ORAL | Status: AC
  Administered 2015-10-17: 650 mg via ORAL
  Filled 2015-10-17: qty 2

## 2015-10-17 NOTE — ED Provider Notes (Signed)
[facility]   CSN: [id number] Arrival date & time: 10/17/15  2048  First Provider Contact:  None    By signing my name below, I, Emmanuella Mensah, attest that this documentation has been prepared under the direction and in the presence of Roxy Horseman, PA-C. Electronically Signed: Angelene Giovanni, ED Scribe. 10/17/15. 9:41 PM.    History   Chief Complaint Chief Complaint  Patient presents with  . Abdominal Pain    HPI Comments: Tiffany Herrera is a 25 y.o. female who presents to the Emergency Department complaining of ongoing moderate right-sided chest pain onset 5 days ago. She reports associated persistent non-productive cough and decreased appetite. She adds that she has associated generalized abdominal tightness s/p coughing. No alleviating factors noted. Pt has not tried any medications PTA. No sick contacts noted. She denies any recent long car/plane trips. She denies any n/v, shortness of breath, leg swelling, vaginal bleeding, vaginal discharge, or dysuria. Pt is currently febrile but reports that she was unaware of fever PTA.   The history is provided by the patient. No language interpreter was used.    History reviewed. No pertinent past medical history.  There are no active problems to display for this patient.   History reviewed. No pertinent surgical history.  OB History    No data available       Home Medications    Prior to Admission medications   Not on File    Family History No family history on file.  Social History Social History  Substance Use Topics  . Smoking status: Never Smoker  . Smokeless tobacco: Never Used  . Alcohol use No     Allergies   Review of patient's allergies indicates no known allergies.   Review of Systems Review of Systems  Respiratory: Positive for cough. Negative for shortness of breath.   Cardiovascular: Positive for chest pain. Negative for leg swelling.  Genitourinary:  Negative for vaginal bleeding and vaginal discharge.     Physical Exam Updated Vital Signs BP 112/73 (BP Location: Left Arm)   Pulse 102   Temp 102 F (38.9 C) (Oral)   Resp 18   Ht  (1.676 m)   Wt 189 lb (85.7 kg)   LMP 10/10/2015   SpO2 98%   BMI 30.51 kg/m   Physical Exam Physical Exam  Constitutional: Pt  is oriented to person, place, and time. Appears well-developed and well-nourished. No distress.  HENT:  Head: Normocephalic and atraumatic.  Right Ear: Tympanic membrane, external ear and ear canal normal.  Left Ear: Tympanic membrane, external ear and ear canal normal.  Nose: Mucosal edema and mild rhinorrhea present. No epistaxis. Right sinus exhibits no maxillary sinus tenderness and no frontal sinus tenderness. Left sinus exhibits no maxillary sinus tenderness and no frontal sinus tenderness.  Mouth/Throat: Uvula is midline and mucous membranes are normal. Mucous membranes are not pale and not cyanotic. No oropharyngeal exudate, posterior oropharyngeal edema, posterior oropharyngeal erythema or tonsillar abscesses.  Eyes: Conjunctivae are normal. Pupils are equal, round, and reactive to light.  Neck: Normal range of motion and full passive range of motion without pain.  Cardiovascular: Normal rate and intact distal pulses.   Pulmonary/Chest: Effort normal and breath sounds normal. No stridor.  Clear and equal breath sounds without focal wheezes, rhonchi, rales  Abdominal: Soft. Bowel sounds are normal. No focal abdominal tenderness, no RLQ tenderness or pain at McBurney's point, no RUQ tenderness or Murphy's sign, no left-sided abdominal tenderness, no  fluid wave, or signs of peritonitis  Musculoskeletal: Normal range of motion.  Lymphadenopathy:    Pthas no cervical adenopathy.  Neurological: Pt is alert and oriented to person, place, and time.  Skin: Skin is warm and dry. No rash noted. Pt is not diaphoretic.  Psychiatric: Normal mood and affect.  Nursing note  and vitals reviewed.    ED Treatments / Results  DIAGNOSTIC STUDIES: Oxygen Saturation is 98% on RA, normal by my interpretation.    COORDINATION OF CARE: 9:39 PM- Pt advised of plan for treatment and pt agrees. She will receive lab work and chest x-ray for further evaluation.    Labs (all labs ordered are listed, but only abnormal results are displayed) Labs Reviewed  URINALYSIS, ROUTINE W REFLEX MICROSCOPIC (NOT [facility]) - Abnormal; Notable for the following:       Result Value   Specific Gravity, Urine 1.004 (*)    Hgb urine dipstick SMALL (*)    Leukocytes, UA TRACE (*)    All other components within normal limits  URINE MICROSCOPIC-ADD ON - Abnormal; Notable for the following:    Squamous Epithelial / LPF 0-5 (*)    Bacteria, UA FEW (*)    All other components within normal limits  PREGNANCY, URINE    EKG  EKG Interpretation None       Radiology Dg Chest 2 View  Result Date: 10/17/2015 CLINICAL DATA:  Right anterior chest pain for 5 days.  Fever, cough. EXAM: CHEST  2 VIEW COMPARISON:  None. FINDINGS: The heart size and mediastinal contours are within normal limits. Both lungs are clear. The visualized skeletal structures are unremarkable. IMPRESSION: No active cardiopulmonary disease. Electronically Signed   By: Norva PavlovElizabeth  Brown M.D.   On: 10/17/2015 22:23    Procedures Procedures (including critical care time)  Medications Ordered in ED Medications  acetaminophen (TYLENOL) tablet 650 mg (650 mg Oral Given 10/17/15 2100)     Initial Impression / Assessment and Plan / ED Course  Roxy Horsemanobert Urho Rio, PA-C has reviewed the triage vital signs and the nursing notes.  Pertinent labs & imaging results that were available during my care of the patient were reviewed by me and considered in my medical decision making (see chart for details).  Clinical Course   Patient presents to the emergency department with chief complaint of cough. She states that the cough causes her  to have chest pain and abdominal pain. She denies any nausea, vomiting, or diarrhea. Denies any dysuria or vaginal discharge. She denies sore throat. She states that she has had a cough for the past few days.  Chest x-ray is clear. Laboratory workup is unremarkable. Patient is feeling better after Tylenol and fluids. She has no focal tenderness on abdominal exam. Her lungs are clear to auscultation. She is well-appearing. Suspect viral syndrome, will discharge to home with primary care follow-up. Return precautions given. Patient understands and agrees the plan. She is stable and ready for discharge.  Final Clinical Impressions(s) / ED Diagnoses   Final diagnoses:  URI (upper respiratory infection)   I personally performed the services described in this documentation, which was scribed in my presence. The recorded information has been reviewed and is accurate.     New Prescriptions New Prescriptions   No medications on file     Roxy HorsemanRobert Valree Feild, PA-C 10/17/15 2246    Arby BarretteMarcy Pfeiffer, MD 10/17/15 [phone]

## 2015-10-17 NOTE — ED Triage Notes (Signed)
C/o abd pain, loss of appetite, weakness since last Friday-NAD-steady gait

## 2016-01-14 ENCOUNTER — Ambulatory Visit (HOSPITAL_COMMUNITY)
Admission: EM | Admit: 2016-01-14 | Discharge: 2016-01-14 | Disposition: A | Discharge status: Home / Self Care | Payer: Medicaid Other | Attending: Family Medicine | Admitting: Family Medicine

## 2016-01-14 ENCOUNTER — Encounter (HOSPITAL_COMMUNITY): Payer: Self-pay | Admitting: Emergency Medicine

## 2016-01-14 DIAGNOSIS — R0789 Other chest pain: Secondary | ICD-10-CM

## 2016-01-14 MED ORDER — NAPROXEN 500 MG PO TABS: 500.0000 mg | ORAL_TABLET | Freq: Two times a day (BID) | ORAL | 0 refills | Status: DC

## 2016-01-14 MED ORDER — KETOROLAC TROMETHAMINE 60 MG/2ML IM SOLN
60.0000 mg | Freq: Once | INTRAMUSCULAR | Status: AC
  Administered 2016-01-14: 60 mg via INTRAMUSCULAR

## 2016-01-14 MED ORDER — KETOROLAC TROMETHAMINE 60 MG/2ML IM SOLN
INTRAMUSCULAR | Status: AC
  Filled 2016-01-14: qty 2

## 2016-01-14 NOTE — ED Provider Notes (Signed)
CSN: [id number]     Arrival date & time 01/14/16  1811 History   None    Chief Complaint  Patient presents with  . Shortness of Breath   (Consider location/radiation/quality/duration/timing/severity/associated sxs/prior Treatment) Patient has left anterior chest wall pain and is having difficulty taking deep breath.     The history is provided by the patient.  Shortness of Breath  Severity:  Moderate Onset quality:  Sudden Duration:  1 day Timing:  Constant Progression:  Worsening Chronicity:  New Context: activity   Relieved by:  Nothing Worsened by:  Nothing Ineffective treatments:  None tried Associated symptoms: chest pain     History reviewed. No pertinent past medical history. History reviewed. No pertinent surgical history. History reviewed. No pertinent family history. Social History  Substance Use Topics  . Smoking status: Never Smoker  . Smokeless tobacco: Never Used  . Alcohol use No   OB History    No data available     Review of Systems  Constitutional: Negative.   HENT: Negative.   Eyes: Negative.   Respiratory: Positive for shortness of breath.   Cardiovascular: Positive for chest pain.  Gastrointestinal: Negative.   Endocrine: Negative.   Genitourinary: Negative.   Musculoskeletal: Negative.   Skin: Negative.   Allergic/Immunologic: Negative.   Neurological: Negative.   Hematological: Negative.   Psychiatric/Behavioral: Negative.     Allergies  Patient has no known allergies.  Home Medications   Prior to Admission medications   Medication Sig Start Date End Date Taking? Authorizing Provider  HYDROcodone-homatropine (HYCODAN) 5-1.5 MG/5ML syrup Take 5 mLs by mouth every 6 (six) hours as needed for cough. Patient not taking: Reported on 01/14/2016 10/17/15   Roxy Horsemanobert Browning, PA-C  naproxen (NAPROSYN) 500 MG tablet Take 1 tablet (500 mg total) by mouth 2 (two) times daily with a meal. 01/14/16   Deatra CanterWilliam J Brydan Downard, FNP   Meds Ordered and  Administered this Visit   Medications  ketorolac (TORADOL) injection 60 mg (60 mg Intramuscular Given 01/14/16 1907)    BP 117/72 (BP Location: Left Arm)   Pulse 65   Temp 98.4 F (36.9 C) (Oral)   Resp 14   SpO2 98%  No data found.   Physical Exam  Constitutional: She appears well-developed and well-nourished.  HENT:  Head: Normocephalic and atraumatic.  Right Ear: External ear normal.  Left Ear: External ear normal.  Mouth/Throat: Oropharynx is clear and moist.  Eyes: Conjunctivae and EOM are normal. Pupils are equal, round, and reactive to light.  Neck: Normal range of motion. Neck supple.  Cardiovascular: Normal rate, regular rhythm and normal heart sounds.   Pulmonary/Chest: Effort normal and breath sounds normal.  Musculoskeletal: She exhibits tenderness.  Left anterior costal and intercostal tenderness.  Nursing note and vitals reviewed.   Urgent Care Course   Clinical Course     Procedures (including critical care time)  Labs Review Labs Reviewed - No data to display  Imaging Review No results found.   Visual Acuity Review  Right Eye Distance:   Left Eye Distance:   Bilateral Distance:    Right Eye Near:   Left Eye Near:    Bilateral Near:         MDM   1. Chest wall pain    Toradol 60mg  IM Naprosyn 500mg  one po bid x 7 days #14    Deatra CanterWilliam J Deisha Stull, FNP 01/14/16 1926

## 2016-01-14 NOTE — ED Triage Notes (Signed)
Onset of symptoms one week ago.  Left chest pain has been intermittent, now pain is increasing and getting sharper.  Movement hurts, laughing makes it "hurt real bad".  Denies chest congestion.  Patient had a runny nose on Saturday.  Patient reports new workout in gym, working with weights approximately one week prior to this pain .  BSC, decreased, limited effort by patient

## 2016-01-27 ENCOUNTER — Emergency Department (HOSPITAL_BASED_OUTPATIENT_CLINIC_OR_DEPARTMENT_OTHER)
Admission: EM | Admit: 2016-01-27 | Discharge: 2016-01-27 | Disposition: A | Discharge status: Home / Self Care | Payer: Medicaid Other | Attending: Emergency Medicine | Admitting: Emergency Medicine

## 2016-01-27 ENCOUNTER — Encounter (HOSPITAL_BASED_OUTPATIENT_CLINIC_OR_DEPARTMENT_OTHER): Payer: Self-pay | Admitting: Emergency Medicine

## 2016-01-27 DIAGNOSIS — M545 Low back pain: Secondary | ICD-10-CM | POA: Diagnosis not present

## 2016-01-27 DIAGNOSIS — Y9389 Activity, other specified: Secondary | ICD-10-CM | POA: Insufficient documentation

## 2016-01-27 DIAGNOSIS — R0789 Other chest pain: Secondary | ICD-10-CM | POA: Insufficient documentation

## 2016-01-27 DIAGNOSIS — Y999 Unspecified external cause status: Secondary | ICD-10-CM | POA: Diagnosis not present

## 2016-01-27 DIAGNOSIS — M25512 Pain in left shoulder: Secondary | ICD-10-CM | POA: Insufficient documentation

## 2016-01-27 DIAGNOSIS — Y92410 Unspecified street and highway as the place of occurrence of the external cause: Secondary | ICD-10-CM | POA: Diagnosis not present

## 2016-01-27 DIAGNOSIS — S299XXA Unspecified injury of thorax, initial encounter: Secondary | ICD-10-CM | POA: Diagnosis present

## 2016-01-27 MED ORDER — CYCLOBENZAPRINE HCL 10 MG PO TABS: 10.0000 mg | ORAL_TABLET | Freq: Two times a day (BID) | ORAL | 0 refills | Status: DC | PRN

## 2016-01-27 MED ORDER — NAPROXEN 500 MG PO TABS: 500.0000 mg | ORAL_TABLET | Freq: Two times a day (BID) | ORAL | 0 refills | Status: DC

## 2016-01-27 NOTE — ED Notes (Signed)
DISREGARD HEENT assessment; charted in error on this pt
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
## 2018-05-14 ENCOUNTER — Emergency Department (HOSPITAL_BASED_OUTPATIENT_CLINIC_OR_DEPARTMENT_OTHER)
Admission: EM | Admit: 2018-05-14 | Discharge: 2018-05-14 | Disposition: A | Discharge status: Home / Self Care | Payer: Medicaid Other | Attending: Emergency Medicine | Admitting: Emergency Medicine

## 2018-05-14 ENCOUNTER — Encounter (HOSPITAL_BASED_OUTPATIENT_CLINIC_OR_DEPARTMENT_OTHER): Payer: Self-pay | Admitting: *Deleted

## 2018-05-14 ENCOUNTER — Other Ambulatory Visit: Payer: Self-pay

## 2018-05-14 DIAGNOSIS — R05 Cough: Secondary | ICD-10-CM | POA: Diagnosis present

## 2018-05-14 DIAGNOSIS — B9789 Other viral agents as the cause of diseases classified elsewhere: Secondary | ICD-10-CM | POA: Insufficient documentation

## 2018-05-14 DIAGNOSIS — J069 Acute upper respiratory infection, unspecified: Secondary | ICD-10-CM | POA: Insufficient documentation

## 2018-05-14 DIAGNOSIS — Z79899 Other long term (current) drug therapy: Secondary | ICD-10-CM | POA: Diagnosis not present

## 2018-05-14 MED ORDER — BENZONATATE 100 MG PO CAPS: 100.0000 mg | ORAL_CAPSULE | Freq: Three times a day (TID) | ORAL | 0 refills | Status: DC

## 2018-05-14 MED ORDER — ALBUTEROL SULFATE HFA 108 (90 BASE) MCG/ACT IN AERS: 2.0000 | INHALATION_SPRAY | RESPIRATORY_TRACT | 0 refills | Status: DC | PRN

## 2018-05-14 MED FILL — BENZONATATE 100 MG CAP: 100 | 7 days supply | Qty: 21 | Fill #0

## 2018-05-14 MED FILL — PROVENTIL HFA 108 (90 BASE): 108 (90 BAS | 17 days supply | Qty: 7 | Fill #0

## 2018-05-14 NOTE — ED Triage Notes (Signed)
Pt reports non-productive cough since Monday. Denies fever

## 2018-05-14 NOTE — ED Provider Notes (Signed)
[facility] EMERGENCY DEPARTMENT Provider Note   CSN: [id number] Arrival date & time: 05/14/18  1240    History   Chief Complaint Chief Complaint  Patient presents with  . Cough    HPI Tiffany Herrera is a 28 y.o. female.     HPI Patient is a 28 year old female presents to the emergency department with complaints of cough without shortness of breath.  Recent sick contact with cough.  Patient denies fever or shortness of breath.  No orthopnea.  No unilateral leg swelling.  Cough is nonproductive.  Symptoms are persistent.   History reviewed. No pertinent past medical history.  There are no active problems to display for this patient.   History reviewed. No pertinent surgical history.   OB History   No obstetric history on file.      Home Medications    Prior to Admission medications   Medication Sig Start Date End Date Taking? Authorizing Provider  albuterol (PROVENTIL HFA;VENTOLIN HFA) 108 (90 Base) MCG/ACT inhaler Inhale 2 puffs into the lungs every 4 (four) hours as needed for shortness of breath (cough). 05/14/18   Azalia Bilis, MD  benzonatate (TESSALON) 100 MG capsule Take 1 capsule (100 mg total) by mouth every 8 (eight) hours. 05/14/18   Azalia Bilis, MD  cyclobenzaprine (FLEXERIL) 10 MG tablet Take 1 tablet (10 mg total) by mouth 2 (two) times daily as needed for muscle spasms. 01/27/16   Fayrene Helper, PA-C  HYDROcodone-homatropine [facility]) 5-1.5 MG/5ML syrup Take 5 mLs by mouth every 6 (six) hours as needed for cough. Patient not taking: Reported on 01/14/2016 10/17/15   Roxy Horseman, PA-C  naproxen (NAPROSYN) 500 MG tablet Take 1 tablet (500 mg total) by mouth 2 (two) times daily with a meal. 01/27/16   Fayrene Helper, PA-C    Family History No family history on file.  Social History Social History   Tobacco Use  . Smoking status: Never Smoker  . Smokeless tobacco: Never Used  Substance Use Topics  . Alcohol use: No  . Drug use: No      Allergies   Patient has no known allergies.   Review of Systems Review of Systems  All other systems reviewed and are negative.    Physical Exam Updated Vital Signs BP 112/72 (BP Location: Right Arm)   Pulse 82   Temp 98.3 F (36.8 C) (Oral)   Resp 16   Ht 5\' 6"  (1.676 m)   Wt 84.4 kg   LMP 05/14/2018   SpO2 99%   BMI 30.02 kg/m   Physical Exam Vitals signs and nursing note reviewed.  Constitutional:      General: She is not in acute distress.    Appearance: She is well-developed.  HENT:     Head: Normocephalic and atraumatic.  Neck:     Musculoskeletal: Normal range of motion.  Cardiovascular:     Rate and Rhythm: Normal rate and regular rhythm.     Heart sounds: Normal heart sounds.  Pulmonary:     Effort: Pulmonary effort is normal.     Breath sounds: Normal breath sounds.  Abdominal:     General: There is no distension.     Palpations: Abdomen is soft.     Tenderness: There is no abdominal tenderness.  Musculoskeletal: Normal range of motion.  Skin:    General: Skin is warm and dry.  Neurological:     Mental Status: She is alert and oriented to person, place, and time.  Psychiatric:  Judgment: Judgment normal.      ED Treatments / Results  Labs (all labs ordered are listed, but only abnormal results are displayed) Labs Reviewed - No data to display  EKG None  Radiology No results found.  Procedures Procedures (including critical care time)  Medications Ordered in ED Medications - No data to display   Initial Impression / Assessment and Plan / ED Course  I have reviewed the triage vital signs and the nursing notes.  Pertinent labs & imaging results that were available during my care of the patient were reviewed by me and considered in my medical decision making (see chart for details).        Likely viral upper respiratory tract infections.  The patient is well-appearing.  She is nontoxic.  No hypoxia on exam.  Lung  exam is clear.  Normal work of breathing.  No indication for chest x-ray.  Close followup with PCP   Final Clinical Impressions(s) / ED Diagnoses   Final diagnoses:  Viral URI with cough    ED Discharge Orders         Ordered    benzonatate (TESSALON) 100 MG capsule  Every 8 hours     05/14/18 1259    albuterol (PROVENTIL HFA;VENTOLIN HFA) 108 (90 Base) MCG/ACT inhaler  Every 4 hours PRN     05/14/18 1259           Azalia Bilis, MD 05/14/18 1301

## 2018-05-28 ENCOUNTER — Other Ambulatory Visit: Payer: Self-pay

## 2018-05-28 ENCOUNTER — Encounter (HOSPITAL_COMMUNITY): Payer: Self-pay | Admitting: Emergency Medicine

## 2018-05-28 ENCOUNTER — Ambulatory Visit (HOSPITAL_COMMUNITY)
Admission: EM | Admit: 2018-05-28 | Discharge: 2018-05-28 | Disposition: A | Discharge status: Home / Self Care | Payer: Medicaid Other | Attending: Family Medicine | Admitting: Family Medicine

## 2018-05-28 DIAGNOSIS — J22 Unspecified acute lower respiratory infection: Secondary | ICD-10-CM | POA: Diagnosis not present

## 2018-05-28 MED ORDER — AMOXICILLIN 875 MG PO TABS: 875.0000 mg | ORAL_TABLET | Freq: Two times a day (BID) | ORAL | 0 refills | Status: DC

## 2018-05-28 MED ORDER — HYDROCODONE-HOMATROPINE 5-1.5 MG/5ML PO SYRP: 5.0000 mL | ORAL_SOLUTION | Freq: Four times a day (QID) | ORAL | 0 refills | Status: DC | PRN

## 2018-05-28 NOTE — ED Provider Notes (Signed)
[facility]    CSN: [id number] Arrival date & time: 05/28/18  7829     History   Chief Complaint Chief Complaint  Patient presents with  . Cough    HPI Tiffany Herrera is a 28 y.o. female.   HPI  Patient was seen in the emergency department on 05/14/2018 for upper respiratory infection with cough.  She was diagnosed with a virus.  She was given an albuterol inhaler for wheezing and Tessalon for the cough.  Given advice on symptomatic relief.  She states she is here with continued symptoms.  She still has some head congestion.  Minor.  No fever or chills.  Continues to have cough.  Has coughing spells that make her chest hurt.  Not producing very much sputum but feels like there is congestion in her chest.  Feels more tired.  No sweats no chills no fever.  No travel.  No known exposure to coronavirus  History reviewed. No pertinent past medical history.  There are no active problems to display for this patient.   History reviewed. No pertinent surgical history.  OB History   No obstetric history on file.      Home Medications    Prior to Admission medications   Medication Sig Start Date End Date Taking? Authorizing Provider  albuterol (PROVENTIL HFA;VENTOLIN HFA) 108 (90 Base) MCG/ACT inhaler Inhale 2 puffs into the lungs every 4 (four) hours as needed for shortness of breath (cough). 05/14/18  Yes Azalia Bilis, MD  amoxicillin (AMOXIL) 875 MG tablet Take 1 tablet (875 mg total) by mouth 2 (two) times daily. 05/28/18   Eustace Moore, MD  HYDROcodone-homatropine [facility]) 5-1.5 MG/5ML syrup Take 5 mLs by mouth every 6 (six) hours as needed for cough. 05/28/18   Eustace Moore, MD    Family History Family History  Problem Relation Age of Onset  . Healthy Mother   . Healthy Father     Social History Social History   Tobacco Use  . Smoking status: Never Smoker  . Smokeless tobacco: Never Used  Substance Use Topics  . Alcohol use: No  . Drug use:  No     Allergies   Patient has no known allergies.   Review of Systems Review of Systems  Constitutional: Negative for chills and fever.  HENT: Positive for congestion and rhinorrhea. Negative for ear pain and sore throat.   Eyes: Negative for pain and visual disturbance.  Respiratory: Positive for cough and chest tightness. Negative for shortness of breath.   Cardiovascular: Negative for chest pain and palpitations.  Gastrointestinal: Negative for abdominal pain and vomiting.  Genitourinary: Negative for dysuria and hematuria.  Musculoskeletal: Negative for arthralgias and back pain.  Skin: Negative for color change and rash.  Neurological: Negative for seizures and syncope.  All other systems reviewed and are negative.    Physical Exam Triage Vital Signs ED Triage Vitals  Enc Vitals Group     BP 05/28/18 0958 (!) 101/44     Pulse Rate 05/28/18 0958 78     Resp 05/28/18 0958 16     Temp 05/28/18 0958 98.3 F (36.8 C)     Temp Source 05/28/18 0958 Oral     SpO2 05/28/18 0958 100 %     Weight --      Height --      Head Circumference --      Peak Flow --      Pain Score 05/28/18 0959 6  Pain Loc --      Pain Edu? --      Excl. in GC? --    No data found.  Updated Vital Signs BP (!) 101/44 (BP Location: Left Arm)   Pulse 78   Temp 98.3 F (36.8 C) (Oral)   Resp 16   LMP 05/14/2018   SpO2 100%       Physical Exam Constitutional:      General: She is not in acute distress.    Appearance: She is well-developed.  HENT:     Head: Normocephalic and atraumatic.     Right Ear: Tympanic membrane, ear canal and external ear normal.     Left Ear: Tympanic membrane, ear canal and external ear normal.     Nose: Congestion present.     Mouth/Throat:     Pharynx: Posterior oropharyngeal erythema present.  Eyes:     Conjunctiva/sclera: Conjunctivae normal.     Pupils: Pupils are equal, round, and reactive to light.  Neck:     Musculoskeletal: Normal range of  motion.  Cardiovascular:     Rate and Rhythm: Normal rate and regular rhythm.     Heart sounds: Normal heart sounds.  Pulmonary:     Effort: Pulmonary effort is normal. No respiratory distress.     Breath sounds: Rhonchi present.  Abdominal:     General: There is no distension.     Palpations: Abdomen is soft.  Musculoskeletal: Normal range of motion.  Lymphadenopathy:     Cervical: No cervical adenopathy.  Skin:    General: Skin is warm and dry.  Neurological:     General: No focal deficit present.     Mental Status: She is alert.  Psychiatric:        Mood and Affect: Mood normal.        Behavior: Behavior normal.      UC Treatments / Results  Labs (all labs ordered are listed, but only abnormal results are displayed) Labs Reviewed - No data to display  EKG None  Radiology No results found.  Procedures Procedures (including critical care time)  Medications Ordered in UC Medications - No data to display  Initial Impression / Assessment and Plan / UC Course  I have reviewed the triage vital signs and the nursing notes.  Pertinent labs & imaging results that were available during my care of the patient were reviewed by me and considered in my medical decision making (see chart for details).    Patient has had 2-1/2-week of symptoms.  She feels like she has more chest congestion coughing than previously.  I am going to cover her with an antibiotic at this time.  Push fluids.  Rest.  Return if not better in a week Final Clinical Impressions(s) / UC Diagnoses   Final diagnoses:  LRTI (lower respiratory tract infection)     Discharge Instructions     Drink plenty of fluids Use a humidifier if you have one Take cough medicine as needed.  The strong cough medicine is useful at night Take antibiotic 2 times a day until prescription is completed Return if not better in a week   ED Prescriptions    Medication Sig Dispense Auth. Provider    HYDROcodone-homatropine (HYCODAN) 5-1.5 MG/5ML syrup Take 5 mLs by mouth every 6 (six) hours as needed for cough. 120 mL Eustace Moore, MD   amoxicillin (AMOXIL) 875 MG tablet Take 1 tablet (875 mg total) by mouth 2 (two) times daily. 14 tablet Delton See,  Letta Pate, MD     Controlled Substance Prescriptions [place] Controlled Substance Registry consulted? Not Applicable   Eustace Moore, MD 05/28/18 [phone]

## 2018-05-28 NOTE — ED Notes (Signed)
Patient able to ambulate independently  

## 2018-05-28 NOTE — Discharge Instructions (Signed)
Drink plenty of fluids Use a humidifier if you have one Take cough medicine as needed.  The strong cough medicine is useful at night Take antibiotic 2 times a day until prescription is completed Return if not better in a week

## 2018-05-28 NOTE — ED Triage Notes (Signed)
Pt presents to [facility] for assessment of non-productive cough x 2 weeks.  Seen in the ER and given an inhaler and tessalon pearls for her cough which are not helping.  C/o some throat soreness now.

## 2018-05-29 ENCOUNTER — Telehealth (HOSPITAL_COMMUNITY): Payer: Self-pay

## 2018-05-29 MED ORDER — HYDROCODONE-HOMATROPINE 5-1.5 MG/5ML PO SYRP: 5.0000 mL | ORAL_SOLUTION | Freq: Four times a day (QID) | ORAL | 0 refills | Status: DC | PRN

## 2018-05-29 NOTE — Telephone Encounter (Signed)
Sending cough medication to different pharmacy.

## 2018-09-21 ENCOUNTER — Other Ambulatory Visit: Payer: Self-pay

## 2018-09-21 ENCOUNTER — Emergency Department (HOSPITAL_BASED_OUTPATIENT_CLINIC_OR_DEPARTMENT_OTHER)
Admission: EM | Admit: 2018-09-21 | Discharge: 2018-09-21 | Disposition: A | Discharge status: Home / Self Care | Payer: Medicaid Other | Attending: Emergency Medicine | Admitting: Emergency Medicine

## 2018-09-21 ENCOUNTER — Encounter (HOSPITAL_BASED_OUTPATIENT_CLINIC_OR_DEPARTMENT_OTHER): Payer: Self-pay | Admitting: Emergency Medicine

## 2018-09-21 DIAGNOSIS — N390 Urinary tract infection, site not specified: Secondary | ICD-10-CM | POA: Insufficient documentation

## 2018-09-21 DIAGNOSIS — Z79899 Other long term (current) drug therapy: Secondary | ICD-10-CM | POA: Insufficient documentation

## 2018-09-21 DIAGNOSIS — R300 Dysuria: Secondary | ICD-10-CM | POA: Diagnosis present

## 2018-09-21 LAB — URINALYSIS, ROUTINE W REFLEX MICROSCOPIC
Bilirubin Urine: NEGATIVE
Glucose, UA: NEGATIVE mg/dL
Ketones, ur: NEGATIVE mg/dL
Nitrite: POSITIVE — AB
Protein, ur: 100 mg/dL — AB
Specific Gravity, Urine: 1.025 (ref 1.005–1.030)
pH: 7 (ref 5.0–8.0)

## 2018-09-21 LAB — URINALYSIS, MICROSCOPIC (REFLEX)
RBC / HPF: >50 RBC/hpf (ref 0–5)
WBC, UA: >50 WBC/hpf (ref 0–5)

## 2018-09-21 LAB — PREGNANCY, URINE: Preg Test, Ur: NEGATIVE

## 2018-09-21 MED ORDER — CEPHALEXIN 250 MG PO CAPS
500.0000 mg | ORAL_CAPSULE | Freq: Once | ORAL | Status: AC
  Administered 2018-09-21: 500 mg via ORAL
  Filled 2018-09-21: qty 2

## 2018-09-21 MED ORDER — PHENAZOPYRIDINE HCL 100 MG PO TABS
95.0000 mg | ORAL_TABLET | Freq: Once | ORAL | Status: AC
  Administered 2018-09-21: 100 mg via ORAL
  Filled 2018-09-21: qty 1

## 2018-09-21 MED ORDER — CEPHALEXIN 500 MG PO CAPS: 500.0000 mg | ORAL_CAPSULE | Freq: Two times a day (BID) | ORAL | 0 refills | Status: DC

## 2018-09-21 MED ORDER — FLUCONAZOLE 150 MG PO TABS: ORAL_TABLET | ORAL | 0 refills | Status: DC

## 2018-09-21 MED ORDER — PHENAZOPYRIDINE HCL 200 MG PO TABS: 200.0000 mg | ORAL_TABLET | Freq: Three times a day (TID) | ORAL | 0 refills | Status: DC

## 2018-09-21 NOTE — ED Triage Notes (Signed)
Patient complains of urinary frequency and urgency and dysuria; onset yesterday.

## 2018-09-21 NOTE — ED Provider Notes (Signed)
[facility] Provider Note: Lowella DellJ. Lane Kinnick Maus, MD, FACEP  CSN: [id number] MRN: [id number] ARRIVAL: 09/21/18 at 0452 ROOM: MH09/MH09   CHIEF COMPLAINT  Dysuria   HISTORY OF PRESENT ILLNESS  09/21/18 5:11 AM Copper Laural BenesJohnson is a 28 y.o. female with urinary frequency, urgency, and voiding small amounts since yesterday.  She also has bladder pressure which she describes as an 8 out of 10.  She denies fever, chills, nausea, vomiting, back pain or vaginal discharge.  She is currently on her menses.   History reviewed. No pertinent past medical history.  History reviewed. No pertinent surgical history.  Family History  Problem Relation Age of Onset  . Healthy Mother   . Healthy Father     Social History   Tobacco Use  . Smoking status: Never Smoker  . Smokeless tobacco: Never Used  Substance Use Topics  . Alcohol use: No  . Drug use: No    Prior to Admission medications   Medication Sig Start Date End Date Taking? Authorizing Provider  albuterol (PROVENTIL HFA;VENTOLIN HFA) 108 (90 Base) MCG/ACT inhaler Inhale 2 puffs into the lungs every 4 (four) hours as needed for shortness of breath (cough). 05/14/18   Azalia Bilisampos, Kevin, MD  amoxicillin (AMOXIL) 875 MG tablet Take 1 tablet (875 mg total) by mouth 2 (two) times daily. 05/28/18   Eustace MooreNelson, Yvonne Sue, MD  HYDROcodone-homatropine [facility](HYCODAN) 5-1.5 MG/5ML syrup Take 5 mLs by mouth every 6 (six) hours as needed for cough. 05/29/18   Mardella LaymanHagler, Brian, MD  [facility]r MedicoXULANE 150-35 MCG/24HR transdermal patch APPLY 1 PATCH TOPICALLY ONCE A WEEK 08/31/18   [provider]    Allergies Patient has no known allergies.   REVIEW OF SYSTEMS  Negative except as noted here or in the History of Present Illness.   PHYSICAL EXAMINATION  Initial Vital Signs Blood pressure 106/68, pulse 60, temperature 98.5 F (36.9 C), temperature source Oral, resp. rate 18, height 5\' 6"  (1.676 m), weight 85 kg, last menstrual period 09/21/2018, SpO2 99 %.   Examination General: Well-developed, well-nourished female in no acute distress; appearance consistent with age of record HENT: normocephalic; atraumatic Eyes: pupils equal, round and reactive to light; extraocular muscles intact Neck: supple Heart: regular rate and rhythm Lungs: clear to auscultation bilaterally Abdomen: soft; nondistended; suprapubic tenderness; bowel sounds present Extremities: No deformity; full range of motion; pulses normal Neurologic: Awake, alert and oriented; motor function intact in all extremities and symmetric; no facial droop Skin: Warm and dry Psychiatric: Normal mood and affect   RESULTS  Summary of this visit's results, reviewed by myself:   EKG Interpretation  Date/Time:    Ventricular Rate:    PR Interval:    QRS Duration:   QT Interval:    QTC Calculation:   R Axis:     Text Interpretation:        Laboratory Studies: Results for orders placed or performed during the hospital encounter of 09/21/18 (from the past 24 hour(s))  Urinalysis, Routine w reflex microscopic     Status: Abnormal   Collection Time: 09/21/18  5:04 AM  Result Value Ref Range   Color, Urine RED (A) YELLOW   APPearance CLOUDY (A) CLEAR   Specific Gravity, Urine 1.025 1.005 - 1.030   pH 7.0 5.0 - 8.0   Glucose, UA NEGATIVE NEGATIVE mg/dL   Hgb urine dipstick LARGE (A) NEGATIVE   Bilirubin Urine NEGATIVE NEGATIVE   Ketones, ur NEGATIVE NEGATIVE mg/dL   Protein, ur 782100 (A) NEGATIVE mg/dL  Nitrite POSITIVE (A) NEGATIVE   Leukocytes,Ua SMALL (A) NEGATIVE  Pregnancy, urine     Status: None   Collection Time: 09/21/18  5:04 AM  Result Value Ref Range   Preg Test, Ur NEGATIVE NEGATIVE  Urinalysis, Microscopic (reflex)     Status: Abnormal   Collection Time: 09/21/18  5:04 AM  Result Value Ref Range   RBC / HPF >50 0 - 5 RBC/hpf   WBC, UA >50 0 - 5 WBC/hpf   Bacteria, UA MANY (A) NONE SEEN   Squamous Epithelial / LPF 6-10 0 - 5   Imaging Studies: No results  found.  ED COURSE and MDM  Nursing notes and initial vitals signs, including pulse oximetry, reviewed.  Vitals:   09/21/18 0503 09/21/18 0509  BP:  106/68  Pulse:  60  Resp:  18  Temp:  98.5 F (36.9 C)  TempSrc:  Oral  SpO2:  99%  Weight: 85 kg   Height: 5\' 6"  (1.676 m)    Clinical presentation and urinalysis consistent with bladder infection.  PROCEDURES    ED DIAGNOSES     ICD-10-CM   1. Lower urinary tract infectious disease  N39.0        Samhita Kretsch, MD 09/21/18 [phone]

## 2018-09-23 LAB — URINE CULTURE: Culture: >=100000 — AB

## 2018-09-24 ENCOUNTER — Telehealth: Payer: Self-pay | Admitting: Emergency Medicine

## 2018-09-24 NOTE — Telephone Encounter (Signed)
Post ED Visit - Positive Culture Follow-up  Culture report reviewed by antimicrobial stewardship pharmacist: [place] Team []  Elenor Quinones, Pharm.D. []  Heide Guile, Pharm.D., BCPS AQ-ID []  Parks Neptune, Pharm.D., BCPS []  Alycia Rossetti, Pharm.D., BCPS []  [place], [place].D., BCPS, AAHIVP []  Legrand Como, Pharm.D., BCPS, AAHIVP [x]  Salome Arnt, PharmD, BCPS []  Johnnette Gourd, PharmD, BCPS []  Hughes Better, PharmD, BCPS []  Leeroy Cha, PharmD []  Laqueta Linden, PharmD, BCPS []  Albertina Parr, PharmD  [place] Team []  Leodis Sias, PharmD []  Lindell Spar, PharmD []  Royetta Asal, PharmD []  Graylin Shiver, Rph []  Rema Fendt) Glennon Mac, PharmD []  Arlyn Dunning, PharmD []  Netta Cedars, PharmD []  Dia Sitter, PharmD []  Leone Haven, PharmD []  Gretta Arab, PharmD []  Theodis Shove, PharmD []  Peggyann Juba, PharmD []  Reuel Boom, PharmD   Positive urine culture Treated with Cephalexin, organism sensitive to the same and no further patient follow-up is required at this time.  Sandi Raveling Dametrius Sanjuan 09/24/2018, 9:59 AM

## 2019-04-20 ENCOUNTER — Encounter (HOSPITAL_BASED_OUTPATIENT_CLINIC_OR_DEPARTMENT_OTHER): Payer: Self-pay | Admitting: Emergency Medicine

## 2019-04-20 ENCOUNTER — Other Ambulatory Visit: Payer: Self-pay

## 2019-04-20 ENCOUNTER — Emergency Department (HOSPITAL_BASED_OUTPATIENT_CLINIC_OR_DEPARTMENT_OTHER)
Admission: EM | Admit: 2019-04-20 | Discharge: 2019-04-20 | Disposition: A | Discharge status: Home / Self Care | Payer: Medicaid Other | Attending: Emergency Medicine | Admitting: Emergency Medicine

## 2019-04-20 DIAGNOSIS — R350 Frequency of micturition: Secondary | ICD-10-CM | POA: Diagnosis present

## 2019-04-20 DIAGNOSIS — N390 Urinary tract infection, site not specified: Secondary | ICD-10-CM | POA: Insufficient documentation

## 2019-04-20 LAB — URINALYSIS, ROUTINE W REFLEX MICROSCOPIC
Bilirubin Urine: NEGATIVE
Glucose, UA: NEGATIVE mg/dL
Ketones, ur: NEGATIVE mg/dL
Nitrite: NEGATIVE
Protein, ur: 30 mg/dL — AB
Specific Gravity, Urine: 1.025 (ref 1.005–1.030)
pH: 6 (ref 5.0–8.0)

## 2019-04-20 LAB — PREGNANCY, URINE: Preg Test, Ur: NEGATIVE

## 2019-04-20 LAB — URINALYSIS, MICROSCOPIC (REFLEX): WBC, UA: >50 WBC/hpf (ref 0–5)

## 2019-04-20 MED ORDER — CEPHALEXIN 250 MG PO CAPS
500.0000 mg | ORAL_CAPSULE | Freq: Once | ORAL | Status: AC
  Administered 2019-04-20: 500 mg via ORAL
  Filled 2019-04-20: qty 2

## 2019-04-20 MED ORDER — CEPHALEXIN 500 MG PO CAPS: 500.0000 mg | ORAL_CAPSULE | Freq: Two times a day (BID) | ORAL | 0 refills | Status: AC

## 2019-04-20 MED FILL — CEPHALEXIN 500 MG CAPSULE: 500 | 5 days supply | Qty: 10 | Fill #0

## 2019-04-20 NOTE — Discharge Instructions (Signed)
Please read instructions below. Take your antibiotic, cephalexin/keflex, as directed until it is gone. Drink plenty of water. Schedule an appointment with your primary care provider to follow up in 1 week. Return to the ER if you develop a fever, vomiting, or new or concerning symptoms.

## 2019-04-20 NOTE — ED Provider Notes (Signed)
[facility] Provider Note   CSN: [id number] Arrival date & time: 04/20/19  6063     History Chief Complaint  Patient presents with  . Urinary Frequency    Tiffany Herrera is a 29 y.o. female without significant past medical history, presenting to the emergency department with complaint of urinary urgency and frequency for about 4 to 5 days.  She states she has been using the restroom more frequently, has significant urgency though little urine output when she does use the restroom.  She reports a pressure sensation in the suprapubic region when she urinates though no significant dysuria.  Denies hematuria or malodorous urine.  No abdominal pain, flank pain, nausea, vomiting, fevers or chills.  She does not have frequent UTIs.  No pelvic complaints.  The history is provided by the patient.       History reviewed. No pertinent past medical history.  There are no problems to display for this patient.   History reviewed. No pertinent surgical history.   OB History   No obstetric history on file.     Family History  Problem Relation Age of Onset  . Healthy Mother   . Healthy Father     Social History   Tobacco Use  . Smoking status: Never Smoker  . Smokeless tobacco: Never Used  Substance Use Topics  . Alcohol use: No  . Drug use: No    Home Medications Prior to Admission medications   Medication Sig Start Date End Date Taking? Authorizing Provider  cephALEXin (KEFLEX) 500 MG capsule Take 1 capsule (500 mg total) by mouth 2 (two) times daily for 5 days. 04/20/19 04/25/19  Ethanjames Fontenot, [place] N, PA-C  fluconazole (DIFLUCAN) 150 MG tablet Take 1 tablet if needed for vaginal yeast infection.  May repeat in 3 days if symptoms persist. 09/21/18   Molpus, Jenny Reichmann, MD  phenazopyridine (PYRIDIUM) 200 MG tablet Take 1 tablet (200 mg total) by mouth 3 (three) times daily. 09/21/18   Molpus, Jenny Reichmann, MD  Marilu Favre 150-35 MCG/24HR transdermal patch APPLY 1 PATCH  TOPICALLY ONCE A WEEK 08/31/18   [provider]  albuterol (PROVENTIL HFA;VENTOLIN HFA) 108 (90 Base) MCG/ACT inhaler Inhale 2 puffs into the lungs every 4 (four) hours as needed for shortness of breath (cough). 05/14/18 09/21/18  Jola Schmidt, MD    Allergies    Patient has no known allergies.  Review of Systems   Review of Systems  All other systems reviewed and are negative.   Physical Exam Updated Vital Signs BP 122/66 (BP Location: Right Arm)   Pulse 65   Temp 98.6 F (37 C) (Oral)   Resp 16   Ht 5\' 6"  (1.676 m)   Wt 89 kg   LMP 04/06/2019   SpO2 99%   BMI 31.68 kg/m   Physical Exam Vitals and nursing note reviewed.  Constitutional:      General: She is not in acute distress.    Appearance: She is well-developed.  HENT:     Head: Normocephalic and atraumatic.  Eyes:     Conjunctiva/sclera: Conjunctivae normal.  Cardiovascular:     Rate and Rhythm: Normal rate.  Pulmonary:     Effort: Pulmonary effort is normal.  Abdominal:     General: Bowel sounds are normal.     Palpations: Abdomen is soft.     Tenderness: There is no abdominal tenderness. There is no guarding or rebound.     Comments: "pressure" sensation with palpation of suprapubic region  Skin:  General: Skin is warm.  Neurological:     Mental Status: She is alert.  Psychiatric:        Behavior: Behavior normal.     ED Results / Procedures / Treatments   Labs (all labs ordered are listed, but only abnormal results are displayed) Labs Reviewed  URINALYSIS, ROUTINE W REFLEX MICROSCOPIC - Abnormal; Notable for the following components:      Result Value   APPearance CLOUDY (*)    Hgb urine dipstick MODERATE (*)    Protein, ur 30 (*)    Leukocytes,Ua LARGE (*)    All other components within normal limits  URINALYSIS, MICROSCOPIC (REFLEX) - Abnormal; Notable for the following components:   Bacteria, UA FEW (*)    All other components within normal limits  URINE CULTURE  PREGNANCY,  URINE    EKG None  Radiology No results found.  Procedures Procedures (including critical care time)  Medications Ordered in ED Medications  cephALEXin (KEFLEX) capsule 500 mg (500 mg Oral Given 04/20/19 1023)    ED Course  I have reviewed the triage vital signs and the nursing notes.  Pertinent labs & imaging results that were available during my care of the patient were reviewed by me and considered in my medical decision making (see chart for details).    MDM Rules/Calculators/A&P                      Pt has been diagnosed with a UTI. Presents with urinary urgency/frequency as well as suprapubic pressure.  Pt is afebrile, no CVA tenderness, normotensive, and denies N/V. Pt to be dc home with antibiotics and instructions to follow up with PCP if symptoms persist.  Discussed results, findings, treatment and follow up. Patient advised of return precautions. Patient verbalized understanding and agreed with plan. Final Clinical Impression(s) / ED Diagnoses Final diagnoses:  Acute lower UTI    Rx / DC Orders ED Discharge Orders         Ordered    cephALEXin (KEFLEX) 500 MG capsule  2 times daily     04/20/19 1017           Dnyla Antonetti, [place] N, [place] 04/20/19 1023    Pricilla Loveless, MD 04/20/19 1435

## 2019-04-20 NOTE — ED Triage Notes (Signed)
Urinary frequency and urgency x1 week.

## 2019-04-22 LAB — URINE CULTURE: Culture: 50000 — AB

## 2019-04-24 ENCOUNTER — Telehealth: Payer: Self-pay | Admitting: Emergency Medicine

## 2019-04-24 NOTE — Telephone Encounter (Signed)
Post ED Visit - Positive Culture Follow-up  Culture report reviewed by antimicrobial stewardship pharmacist: [facility] []  , Pharm.D. []  Enzo Bi, Pharm.D., BCPS AQ-ID []  , Pharm.D., BCPS []  Celedonio Miyamoto, Pharm.D., BCPS []  [place], Garvin Fila.D., BCPS, AAHIVP []  , Pharm.D., BCPS, AAHIVP []  Georgina Pillion, PharmD, BCPS []  , PharmD, BCPS []  [place], PharmD, BCPS [x]  [address], PharmD []  , PharmD, BCPS []  Estella Husk, PharmD  Pharmacy Team []  Lysle Pearl, PharmD []  , PharmD []  Phillips Climes, PharmD []  , Rph []  Agapito Games) , PharmD []  Joaquim Lai, PharmD []  , PharmD []  Mervyn Gay, PharmD []  , PharmD []  Vinnie Level, PharmD []  [facility], PharmD []  , PharmD []  Len Childs, PharmD   Positive urine culture Treated with Cephalexin, organism sensitive to the same and no further patient follow-up is required at this time.  Brinna Divelbiss 04/24/2019, 10:24 AM

## 2019-05-15 ENCOUNTER — Encounter (HOSPITAL_BASED_OUTPATIENT_CLINIC_OR_DEPARTMENT_OTHER): Payer: Self-pay | Admitting: Emergency Medicine

## 2019-05-15 ENCOUNTER — Other Ambulatory Visit: Payer: Self-pay

## 2019-05-15 ENCOUNTER — Emergency Department (HOSPITAL_BASED_OUTPATIENT_CLINIC_OR_DEPARTMENT_OTHER)
Admission: EM | Admit: 2019-05-15 | Discharge: 2019-05-15 | Disposition: A | Discharge status: Home / Self Care | Payer: No Typology Code available for payment source | Attending: Emergency Medicine | Admitting: Emergency Medicine

## 2019-05-15 ENCOUNTER — Emergency Department (HOSPITAL_BASED_OUTPATIENT_CLINIC_OR_DEPARTMENT_OTHER): Payer: No Typology Code available for payment source

## 2019-05-15 DIAGNOSIS — R519 Headache, unspecified: Secondary | ICD-10-CM

## 2019-05-15 DIAGNOSIS — M7918 Myalgia, other site: Secondary | ICD-10-CM

## 2019-05-15 DIAGNOSIS — G44209 Tension-type headache, unspecified, not intractable: Secondary | ICD-10-CM | POA: Insufficient documentation

## 2019-05-15 DIAGNOSIS — R1030 Lower abdominal pain, unspecified: Secondary | ICD-10-CM | POA: Insufficient documentation

## 2019-05-15 LAB — URINALYSIS, MICROSCOPIC (REFLEX)

## 2019-05-15 LAB — BASIC METABOLIC PANEL
Anion gap: 7 (ref 5–15)
BUN: 13 mg/dL (ref 6–20)
CO2: 25 mmol/L (ref 22–32)
Calcium: 9 mg/dL (ref 8.9–10.3)
Chloride: 103 mmol/L (ref 98–111)
Creatinine, Ser: 0.96 mg/dL (ref 0.44–1.00)
GFR calc Af Amer: >60 mL/min (ref >60)
GFR calc non Af Amer: >60 mL/min (ref >60)
Glucose, Bld: 104 mg/dL — ABNORMAL HIGH (ref 70–99)
Potassium: 3.9 mmol/L (ref 3.5–5.1)
Sodium: 135 mmol/L (ref 135–145)

## 2019-05-15 LAB — CBC
HCT: 41.1 % (ref 36.0–46.0)
Hemoglobin: 13.5 g/dL (ref 12.0–15.0)
MCH: 27.6 pg (ref 26.0–34.0)
MCHC: 32.8 g/dL (ref 30.0–36.0)
MCV: 84 fL (ref 80.0–100.0)
Platelets: 244 10*3/uL (ref 150–400)
RBC: 4.89 MIL/uL (ref 3.87–5.11)
RDW: 13.1 % (ref 11.5–15.5)
WBC: 4.9 10*3/uL (ref 4.0–10.5)
nRBC: 0 % (ref 0.0–0.2)

## 2019-05-15 LAB — URINALYSIS, ROUTINE W REFLEX MICROSCOPIC
Bilirubin Urine: NEGATIVE
Glucose, UA: NEGATIVE mg/dL
Hgb urine dipstick: NEGATIVE
Ketones, ur: NEGATIVE mg/dL
Nitrite: NEGATIVE
Protein, ur: NEGATIVE mg/dL
Specific Gravity, Urine: 1.025 (ref 1.005–1.030)
pH: 7 (ref 5.0–8.0)

## 2019-05-15 LAB — PREGNANCY, URINE: Preg Test, Ur: NEGATIVE

## 2019-05-15 MED ORDER — IOHEXOL 300 MG/ML  SOLN
100.0000 mL | Freq: Once | INTRAMUSCULAR | Status: AC | PRN
  Administered 2019-05-15: 100 mL via INTRAVENOUS

## 2019-05-15 MED ORDER — METHOCARBAMOL 500 MG PO TABS: 1000.0000 mg | ORAL_TABLET | Freq: Four times a day (QID) | ORAL | 0 refills | Status: DC

## 2019-05-15 MED ORDER — NAPROXEN 500 MG PO TABS: 500.0000 mg | ORAL_TABLET | Freq: Two times a day (BID) | ORAL | 0 refills | Status: DC
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
## 2019-06-08 ENCOUNTER — Other Ambulatory Visit: Payer: Self-pay

## 2019-06-08 ENCOUNTER — Encounter (HOSPITAL_BASED_OUTPATIENT_CLINIC_OR_DEPARTMENT_OTHER): Payer: Self-pay | Admitting: *Deleted

## 2019-06-08 ENCOUNTER — Emergency Department (HOSPITAL_BASED_OUTPATIENT_CLINIC_OR_DEPARTMENT_OTHER)
Admission: EM | Admit: 2019-06-08 | Discharge: 2019-06-08 | Disposition: A | Discharge status: Home / Self Care | Payer: Medicaid Other | Attending: Emergency Medicine | Admitting: Emergency Medicine

## 2019-06-08 DIAGNOSIS — R3915 Urgency of urination: Secondary | ICD-10-CM | POA: Insufficient documentation

## 2019-06-08 DIAGNOSIS — N3001 Acute cystitis with hematuria: Secondary | ICD-10-CM

## 2019-06-08 DIAGNOSIS — R300 Dysuria: Secondary | ICD-10-CM | POA: Diagnosis present

## 2019-06-08 DIAGNOSIS — R350 Frequency of micturition: Secondary | ICD-10-CM | POA: Insufficient documentation

## 2019-06-08 LAB — URINALYSIS, MICROSCOPIC (REFLEX)
RBC / HPF: >50 RBC/hpf (ref 0–5)
WBC, UA: >50 WBC/hpf (ref 0–5)

## 2019-06-08 LAB — URINALYSIS, ROUTINE W REFLEX MICROSCOPIC
Bilirubin Urine: NEGATIVE
Glucose, UA: NEGATIVE mg/dL
Ketones, ur: NEGATIVE mg/dL
Nitrite: POSITIVE — AB
Protein, ur: 100 mg/dL — AB
Specific Gravity, Urine: >1.03 — ABNORMAL HIGH (ref 1.005–1.030)
pH: 6 (ref 5.0–8.0)

## 2019-06-08 LAB — PREGNANCY, URINE: Preg Test, Ur: NEGATIVE

## 2019-06-08 MED ORDER — CEPHALEXIN 500 MG PO CAPS: 500.0000 mg | ORAL_CAPSULE | Freq: Three times a day (TID) | ORAL | 0 refills | Status: DC

## 2019-06-08 MED ORDER — PHENAZOPYRIDINE HCL 200 MG PO TABS: 200.0000 mg | ORAL_TABLET | Freq: Three times a day (TID) | ORAL | 0 refills | Status: DC | PRN

## 2019-06-08 NOTE — ED Triage Notes (Signed)
Dysuria x several days. Hematuria tonight.

## 2019-06-08 NOTE — ED Provider Notes (Signed)
[facility] EMERGENCY DEPARTMENT Provider Note   CSN: [id number] Arrival date & time: 06/08/19  2149     History Chief Complaint  Patient presents with  . Dysuria    Tiffany Herrera is a 29 y.o. female without significant past medical hx who presents to the ED with complaints of urinary sxs that began this AM. Patient reports dysuria, urgency, frequency, and 1 episode of questionable hematuria with associated suprapubic abdominal discomfort that goes into the lower back at times discomfort described as pressure that becomes severe with urination, no flank pain. No other alleviating/aggravating factors. No intervention PTA. Feels similar to prior UTI. Denies fever, chills, nausea, vomiting, unilateral flank pain, vaginal bleeding, or vaginal discharge. Currently sexually active in a monogamous relationship without concern for STI.   HPI     History reviewed. No pertinent past medical history.  There are no problems to display for this patient.   History reviewed. No pertinent surgical history.   OB History   No obstetric history on file.     Family History  Problem Relation Age of Onset  . Healthy Mother   . Healthy Father     Social History   Tobacco Use  . Smoking status: Never Smoker  . Smokeless tobacco: Never Used  Substance Use Topics  . Alcohol use: No  . Drug use: No    Home Medications Prior to Admission medications   Medication Sig Start Date End Date Taking? Authorizing Provider  fluconazole (DIFLUCAN) 150 MG tablet Take 1 tablet if needed for vaginal yeast infection.  May repeat in 3 days if symptoms persist. 09/21/18   Molpus, Jonny Ruiz, MD  methocarbamol (ROBAXIN) 500 MG tablet Take 2 tablets (1,000 mg total) by mouth 4 (four) times daily. 05/15/19   Renne Crigler, PA-C  naproxen (NAPROSYN) 500 MG tablet Take 1 tablet (500 mg total) by mouth 2 (two) times daily. 05/15/19   Renne Crigler, PA-C  phenazopyridine (PYRIDIUM) 200 MG tablet Take 1 tablet  (200 mg total) by mouth 3 (three) times daily. 09/21/18   Molpus, Jonny Ruiz, MD  Burr Medico 150-35 MCG/24HR transdermal patch APPLY 1 PATCH TOPICALLY ONCE A WEEK 08/31/18   [provider]  albuterol (PROVENTIL HFA;VENTOLIN HFA) 108 (90 Base) MCG/ACT inhaler Inhale 2 puffs into the lungs every 4 (four) hours as needed for shortness of breath (cough). 05/14/18 09/21/18  Azalia Bilis, MD    Allergies    Patient has no known allergies.  Review of Systems   Review of Systems  Constitutional: Negative for chills and fever.  Respiratory: Negative for shortness of breath.   Cardiovascular: Negative for chest pain.  Gastrointestinal: Positive for abdominal pain. Negative for blood in stool, constipation, diarrhea, nausea and vomiting.  Genitourinary: Positive for dysuria, frequency, hematuria and urgency. Negative for flank pain, vaginal bleeding and vaginal discharge.  Musculoskeletal: Positive for back pain.  Neurological: Negative for syncope.  All other systems reviewed and are negative.   Physical Exam Updated Vital Signs BP 111/65 (BP Location: Left Arm)   Pulse 63   Temp 98.4 F (36.9 C) (Oral)   Resp 18   Ht 5\' 6"  (1.676 m)   Wt 83.9 kg   LMP 05/25/2019   BMI 29.86 kg/m   Physical Exam Vitals and nursing note reviewed.  Constitutional:      General: She is not in acute distress.    Appearance: She is well-developed. She is not toxic-appearing.  HENT:     Head: Normocephalic and atraumatic.  Eyes:  General:        Right eye: No discharge.        Left eye: No discharge.     Conjunctiva/sclera: Conjunctivae normal.  Cardiovascular:     Rate and Rhythm: Normal rate and regular rhythm.  Pulmonary:     Effort: Pulmonary effort is normal. No respiratory distress.     Breath sounds: Normal breath sounds. No wheezing, rhonchi or rales.  Abdominal:     General: There is no distension.     Palpations: Abdomen is soft.     Tenderness: There is abdominal tenderness (very mild  suprapubic, patient states feels more like pressure). There is no right CVA tenderness, left CVA tenderness, guarding or rebound.  Musculoskeletal:     Cervical back: Neck supple.  Skin:    General: Skin is warm and dry.     Findings: No rash.  Neurological:     Mental Status: She is alert.     Comments: Clear speech.   Psychiatric:        Behavior: Behavior normal.     ED Results / Procedures / Treatments   Labs (all labs ordered are listed, but only abnormal results are displayed) Labs Reviewed  URINALYSIS, ROUTINE W REFLEX MICROSCOPIC - Abnormal; Notable for the following components:      Result Value   APPearance CLOUDY (*)    Specific Gravity, Urine >1.030 (*)    Hgb urine dipstick LARGE (*)    Protein, ur 100 (*)    Nitrite POSITIVE (*)    Leukocytes,Ua MODERATE (*)    All other components within normal limits  URINALYSIS, MICROSCOPIC (REFLEX) - Abnormal; Notable for the following components:   Bacteria, UA MANY (*)    All other components within normal limits  PREGNANCY, URINE    EKG None  Radiology No results found.  Procedures Procedures (including critical care time)  Medications Ordered in ED Medications - No data to display  ED Course  I have reviewed the triage vital signs and the nursing notes.  Pertinent labs & imaging results that were available during my care of the patient were reviewed by me and considered in my medical decision making (see chart for details).    MDM Rules/Calculators/A&P                      Patient presents to the ED with complaints of urinary sxs with suprapubic abdominal discomfort. Nontoxic, resting comfortably, vitals WNL. Exam with very minimal suprapubic tenderness, no peritoneal signs. Offered pelvic exam- shared decision making with patient- ultimately deferred. UA consistent with infection. Preg test negative. Afebrile, no N/V, some lower back pressure but no flank pain, no CVA tenderness, does not appear consistent  w/ pyeloneprhitis. Patient does not appear septic. Appears appropriate for outpatient abx and pyridium. I discussed results, treatment plan, need for follow-up, and return precautions with the patient. Provided opportunity for questions, patient confirmed understanding and is in agreement with plan.   Final Clinical Impression(s) / ED Diagnoses Final diagnoses:  Acute cystitis with hematuria    Rx / DC Orders ED Discharge Orders         Ordered    cephALEXin (KEFLEX) 500 MG capsule  3 times daily     06/08/19 2233    phenazopyridine (PYRIDIUM) 200 MG tablet  3 times daily PRN     06/08/19 2233           Amaryllis Dyke, PA-C 06/08/19 2234    Kohut,  Jeannett Senior, MD 06/09/19 9532

## 2019-06-08 NOTE — Discharge Instructions (Addendum)
You were seen in the ER today for urinary symptoms and diagnosed with a UTI. Your pregnancy test was negative.   We are sending you home with keflex, an antibiotic, to treat this infection as well as pyridium- a medicine to help with your discomfort. Please take each of these medications as prescribed.   We have prescribed you new medication(s) today. Discuss the medications prescribed today with your pharmacist as they can have adverse effects and interactions with your other medicines including over the counter and prescribed medications. Seek medical evaluation if you start to experience new or abnormal symptoms after taking one of these medicines, seek care immediately if you start to experience difficulty breathing, feeling of your throat closing, facial swelling, or rash as these could be indications of a more serious allergic reaction  Please follow up with your primary care provider within 1 week for re-evaluation. Return to the ED for new or worsening symptoms including but not limited to increased pain, fever, chills, vomiting, pain to one side of your back, or any other concerns.

## 2020-03-15 ENCOUNTER — Emergency Department (HOSPITAL_BASED_OUTPATIENT_CLINIC_OR_DEPARTMENT_OTHER)
Admission: EM | Admit: 2020-03-15 | Discharge: 2020-03-15 | Disposition: A | Discharge status: Home / Self Care | Payer: BLUE CROSS/BLUE SHIELD | Attending: Emergency Medicine | Admitting: Emergency Medicine

## 2020-03-15 ENCOUNTER — Encounter (HOSPITAL_BASED_OUTPATIENT_CLINIC_OR_DEPARTMENT_OTHER): Payer: Self-pay | Admitting: *Deleted

## 2020-03-15 ENCOUNTER — Other Ambulatory Visit: Payer: Self-pay

## 2020-03-15 DIAGNOSIS — R3915 Urgency of urination: Secondary | ICD-10-CM | POA: Insufficient documentation

## 2020-03-15 DIAGNOSIS — M545 Low back pain, unspecified: Secondary | ICD-10-CM | POA: Insufficient documentation

## 2020-03-15 LAB — URINALYSIS, ROUTINE W REFLEX MICROSCOPIC
Bilirubin Urine: NEGATIVE
Glucose, UA: NEGATIVE mg/dL
Hgb urine dipstick: NEGATIVE
Ketones, ur: NEGATIVE mg/dL
Leukocytes,Ua: NEGATIVE
Nitrite: NEGATIVE
Protein, ur: NEGATIVE mg/dL
Specific Gravity, Urine: 1.02 (ref 1.005–1.030)
pH: 6 (ref 5.0–8.0)

## 2020-03-15 LAB — PREGNANCY, URINE: Preg Test, Ur: NEGATIVE

## 2020-03-15 MED ORDER — KETOROLAC TROMETHAMINE 60 MG/2ML IM SOLN
30.0000 mg | Freq: Once | INTRAMUSCULAR | Status: DC
  Filled 2020-03-15: qty 2

## 2020-03-15 MED ORDER — PHENAZOPYRIDINE HCL 100 MG PO TABS
95.0000 mg | ORAL_TABLET | Freq: Once | ORAL | Status: AC
  Administered 2020-03-15: 100 mg via ORAL
  Filled 2020-03-15: qty 1

## 2020-03-15 MED ORDER — PHENAZOPYRIDINE HCL 200 MG PO TABS: 200.0000 mg | ORAL_TABLET | Freq: Three times a day (TID) | ORAL | 0 refills | Status: DC | PRN

## 2020-03-15 NOTE — ED Triage Notes (Signed)
Pt dx and treated for UTI last week. Complete macrobid today, continues to have urinary urgency and back pain. Denies vaginal discharge. Denies fever. Denies dysuria

## 2020-03-15 NOTE — ED Provider Notes (Signed)
[facility] EMERGENCY DEPARTMENT Provider Note   CSN: [id number] Arrival date & time: 03/15/20  0108     History Chief Complaint  Patient presents with  . Urinary Tract Infection    Tiffany Herrera is a 30 y.o. female.  Patient has a history of urinary tract infections.  She recently finished treatment for 1.  She is on Macrobid for a week.  She states that she still has urinary urgency and some mild lower back pain.  She also has decreased urine output.  She does not have any fevers, nausea, vomiting, diarrhea or abdominal pain.  Patient does not have any vaginal discharge.  No other associated symptoms.    Urinary Tract Infection      History reviewed. No pertinent past medical history.  There are no problems to display for this patient.   History reviewed. No pertinent surgical history.   OB History   No obstetric history on file.     Family History  Problem Relation Age of Onset  . Healthy Mother   . Healthy Father     Social History   Tobacco Use  . Smoking status: Never Smoker  . Smokeless tobacco: Never Used  Vaping Use  . Vaping Use: Never used  Substance Use Topics  . Alcohol use: No  . Drug use: No    Home Medications Prior to Admission medications   Medication Sig Start Date End Date Taking? Authorizing Provider  cephALEXin (KEFLEX) 500 MG capsule Take 1 capsule (500 mg total) by mouth 3 (three) times daily. 06/08/19   Petrucelli, Samantha R, PA-C  fluconazole (DIFLUCAN) 150 MG tablet Take 1 tablet if needed for vaginal yeast infection.  May repeat in 3 days if symptoms persist. 09/21/18   Molpus, Jonny Ruiz, MD  methocarbamol (ROBAXIN) 500 MG tablet Take 2 tablets (1,000 mg total) by mouth 4 (four) times daily. 05/15/19   Renne Crigler, PA-C  naproxen (NAPROSYN) 500 MG tablet Take 1 tablet (500 mg total) by mouth 2 (two) times daily. 05/15/19   Renne Crigler, PA-C  phenazopyridine (PYRIDIUM) 200 MG tablet Take 1 tablet (200 mg total) by mouth 3  (three) times daily as needed for pain. 06/08/19   Petrucelli, Pleas Koch, PA-C  XULANE 150-35 MCG/24HR transdermal patch APPLY 1 PATCH TOPICALLY ONCE A WEEK 08/31/18   [provider]  albuterol (PROVENTIL HFA;VENTOLIN HFA) 108 (90 Base) MCG/ACT inhaler Inhale 2 puffs into the lungs every 4 (four) hours as needed for shortness of breath (cough). 05/14/18 09/21/18  Azalia Bilis, MD    Allergies    Patient has no known allergies.  Review of Systems   Review of Systems  All other systems reviewed and are negative.   Physical Exam Updated Vital Signs BP 106/73 (BP Location: Left Arm)   Pulse 68   Temp 98.1 F (36.7 C) (Oral)   Resp 18   Ht 5\' 6"  (1.676 m)   Wt 86.2 kg   LMP 03/01/2020   SpO2 100%   BMI 30.67 kg/m   Physical Exam Vitals and nursing note reviewed.  Constitutional:      Appearance: She is well-developed and well-nourished.  HENT:     Head: Normocephalic and atraumatic.     Nose: No congestion or rhinorrhea.     Mouth/Throat:     Mouth: Mucous membranes are moist.     Pharynx: Oropharynx is clear.  Eyes:     Pupils: Pupils are equal, round, and reactive to light.  Cardiovascular:  Rate and Rhythm: Normal rate and regular rhythm.  Pulmonary:     Effort: No respiratory distress.     Breath sounds: No stridor.  Abdominal:     General: Abdomen is flat. There is no distension.  Musculoskeletal:        General: No swelling or tenderness (including no back/cva). Normal range of motion.     Cervical back: Normal range of motion.  Skin:    General: Skin is warm and dry.  Neurological:     General: No focal deficit present.     Mental Status: She is alert.     ED Results / Procedures / Treatments   Labs (all labs ordered are listed, but only abnormal results are displayed) Labs Reviewed  URINALYSIS, ROUTINE W REFLEX MICROSCOPIC  PREGNANCY, URINE    EKG None  Radiology No results found.  Procedures Procedures (including critical care  time)  Medications Ordered in ED Medications  ketorolac (TORADOL) injection 30 mg (30 mg Intramuscular Not Given 03/15/20 0255)  phenazopyridine (PYRIDIUM) tablet 100 mg (100 mg Oral Given 03/15/20 0252)    ED Course  I have reviewed the triage vital signs and the nursing notes.  Pertinent labs & imaging results that were available during my care of the patient were reviewed by me and considered in my medical decision making (see chart for details).    MDM Rules/Calculators/A&P                          Urine normal. Appears well. Exam reassuring. Unclear etiology. Will try pyridium and urology follow up if not improving.   Final Clinical Impression(s) / ED Diagnoses Final diagnoses:  None    Rx / DC Orders ED Discharge Orders    None       Nashonda Limberg, Barbara Cower, MD 03/15/20 [phone]

## 2020-11-01 ENCOUNTER — Ambulatory Visit
Admission: EM | Admit: 2020-11-01 | Discharge: 2020-11-01 | Disposition: A | Discharge status: Home / Self Care | Payer: Medicaid Other | Attending: Family Medicine | Admitting: Family Medicine

## 2020-11-01 ENCOUNTER — Ambulatory Visit (INDEPENDENT_AMBULATORY_CARE_PROVIDER_SITE_OTHER): Payer: Medicaid Other

## 2020-11-01 ENCOUNTER — Other Ambulatory Visit: Payer: Self-pay

## 2020-11-01 DIAGNOSIS — S63501A Unspecified sprain of right wrist, initial encounter: Secondary | ICD-10-CM | POA: Diagnosis not present

## 2020-11-01 DIAGNOSIS — M25531 Pain in right wrist: Secondary | ICD-10-CM

## 2020-11-01 DIAGNOSIS — W19XXXA Unspecified fall, initial encounter: Secondary | ICD-10-CM

## 2020-11-01 MED ORDER — NAPROXEN 500 MG PO TABS: 500.0000 mg | ORAL_TABLET | Freq: Two times a day (BID) | ORAL | 0 refills | Status: DC | PRN

## 2020-11-01 NOTE — ED Triage Notes (Signed)
6 days ago, while playing kickball, Pt fell on her right wrist causing a sudden onset of pain and swelling. No LOC. Has been taking tylenol without relief.

## 2020-11-01 NOTE — ED Provider Notes (Signed)
[name]    CSN: [id number] Arrival date & time: 11/01/20  1822      History   Chief Complaint Chief Complaint  Patient presents with   Wrist Pain    right    HPI Tiffany Herrera is a 30 y.o. female.   Patient presenting today with right wrist pain for 1 week after falling onto that side of her body while playing kickball.  She states the area has been mildly swollen and it is painful to move her thumb and thumb side of wrist.  She denies numbness, tingling, weakness discoloration or abrasion.  Tried some Tylenol with no relief.   History reviewed. No pertinent past medical history.  There are no problems to display for this patient.   History reviewed. No pertinent surgical history.  OB History   No obstetric history on file.      Home Medications    Prior to Admission medications   Medication Sig Start Date End Date Taking? Authorizing Provider  naproxen (NAPROSYN) 500 MG tablet Take 1 tablet (500 mg total) by mouth 2 (two) times daily as needed. 11/01/20   Particia Nearing, PA-C  phenazopyridine (PYRIDIUM) 200 MG tablet Take 1 tablet (200 mg total) by mouth 3 (three) times daily as needed for pain. 03/15/20   Mesner, Barbara Cower, MD  [employer] 150-35 MCG/24HR transdermal patch APPLY 1 PATCH TOPICALLY ONCE A WEEK 08/31/18   [provider]  albuterol (PROVENTIL HFA;VENTOLIN HFA) 108 (90 Base) MCG/ACT inhaler Inhale 2 puffs into the lungs every 4 (four) hours as needed for shortness of breath (cough). 05/14/18 09/21/18  Azalia Bilis, MD    Family History Family History  Problem Relation Age of Onset   Healthy Mother    Healthy Father     Social History Social History   Tobacco Use   Smoking status: Never   Smokeless tobacco: Never  Vaping Use   Vaping Use: Never used  Substance Use Topics   Alcohol use: No   Drug use: No     Allergies   Patient has no known allergies.   Review of Systems Review of Systems Per HPI  Physical  Exam Triage Vital Signs ED Triage Vitals  Enc Vitals Group     BP 11/01/20 1859 113/73     Pulse Rate 11/01/20 1859 68     Resp 11/01/20 1859 18     Temp 11/01/20 1859 99 F (37.2 C)     Temp Source 11/01/20 1859 Oral     SpO2 11/01/20 1859 98 %     Weight --      Height --      Head Circumference --      Peak Flow --      Pain Score 11/01/20 1900 9     Pain Loc --      Pain Edu? --      Excl. in GC? --    No data found.  Updated Vital Signs BP 113/73 (BP Location: Left Arm)   Pulse 68   Temp 99 F (37.2 C) (Oral)   Resp 18   SpO2 98%   Visual Acuity Right Eye Distance:   Left Eye Distance:   Bilateral Distance:    Right Eye Near:   Left Eye Near:    Bilateral Near:     Physical Exam Vitals and nursing note reviewed.  Constitutional:      Appearance: Normal appearance. She is not ill-appearing.  HENT:     Head:  Atraumatic.  Eyes:     Extraocular Movements: Extraocular movements intact.     Conjunctiva/sclera: Conjunctivae normal.  Cardiovascular:     Rate and Rhythm: Normal rate and regular rhythm.     Heart sounds: Normal heart sounds.  Pulmonary:     Effort: Pulmonary effort is normal.     Breath sounds: Normal breath sounds.  Musculoskeletal:        General: Tenderness and signs of injury present. No swelling or deformity. Normal range of motion.     Cervical back: Normal range of motion and neck supple.     Comments: Range of motion intact but painful to move around of the right hand.  No significant edema noted, no bony deformity palpable  Skin:    General: Skin is warm and dry.     Findings: No bruising, erythema or rash.  Neurological:     Mental Status: She is alert and oriented to person, place, and time.     Comments: Right upper extremity neurovascularly intact  Psychiatric:        Mood and Affect: Mood normal.        Thought Content: Thought content normal.        Judgment: Judgment normal.     UC Treatments / Results  Labs (all  labs ordered are listed, but only abnormal results are displayed) Labs Reviewed - No data to display  EKG   Radiology DG Wrist Complete Right  Result Date: 11/01/2020 CLINICAL DATA:  Larey Seat EXAM: RIGHT WRIST - COMPLETE 3+ VIEW COMPARISON:  None. FINDINGS: Frontal, oblique, lateral, and ulnar deviated views of the right wrist are obtained. No acute fracture, subluxation, or dislocation. There is mild osteoarthritis at the first metacarpophalangeal joint. Remaining joint spaces are well preserved. Soft tissues are unremarkable. IMPRESSION: 1. 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 2. No acute displaced fracture. Electronically Signed   By: Sharlet Salina M.D.   On: 11/01/2020 18:53    Procedures Procedures (including critical care time)  Medications Ordered in UC Medications - No data to display  Initial Impression / Assessment and Plan / UC Course  I have reviewed the triage vital signs and the nursing notes.  Pertinent labs & imaging results that were available during my care of the patient were reviewed by me and considered in my medical decision making (see chart for details).     Exam reassuring today, x-ray without bony injury or abnormality.  Suspect wrist sprain, will give wrist brace and discussed RICE protocol, naproxen and Tylenol as needed.  Return for worsening symptoms.  Final Clinical Impressions(s) / UC Diagnoses   Final diagnoses:  Sprain of right wrist, initial encounter   Discharge Instructions   None    ED Prescriptions     Medication Sig Dispense Auth. Provider   naproxen (NAPROSYN) 500 MG tablet Take 1 tablet (500 mg total) by mouth 2 (two) times daily as needed. 30 tablet Particia Nearing, [place]      PDMP not reviewed this encounter.   Particia Nearing, [place] 11/01/20 1933

## 2020-12-11 ENCOUNTER — Encounter: Payer: Self-pay | Admitting: Emergency Medicine

## 2020-12-11 ENCOUNTER — Ambulatory Visit (INDEPENDENT_AMBULATORY_CARE_PROVIDER_SITE_OTHER): Payer: Medicaid Other

## 2020-12-11 ENCOUNTER — Other Ambulatory Visit: Payer: Self-pay

## 2020-12-11 ENCOUNTER — Ambulatory Visit
Admission: EM | Admit: 2020-12-11 | Discharge: 2020-12-11 | Disposition: A | Discharge status: Home / Self Care | Payer: Medicaid Other | Attending: Internal Medicine | Admitting: Internal Medicine

## 2020-12-11 DIAGNOSIS — M25521 Pain in right elbow: Secondary | ICD-10-CM

## 2020-12-11 MED ORDER — IBUPROFEN 600 MG PO TABS: 600.0000 mg | ORAL_TABLET | Freq: Four times a day (QID) | ORAL | 0 refills | Status: DC | PRN

## 2020-12-11 NOTE — ED Triage Notes (Addendum)
Right elbow pain starting after beginning new workout routine of lifting weights last week, worsening into today. States the elbow was swollen last week. Pain with extension and flexion of elbow, no pain with rotation. Has been using tylenol, mildly helped with pain relief. Denies acute injury

## 2020-12-11 NOTE — Discharge Instructions (Addendum)
The x-ray of your right elbow was normal.  You have been prescribed ibuprofen to take to decrease inflammation and pain.  Please do not take any additional ibuprofen, Advil, Aleve, naproxen while taking this ibuprofen.  Also use ice application to elbow.  Follow-up with provided contact information for orthopedist if pain persists over the next 1.5 to 2 weeks.

## 2020-12-11 NOTE — ED Provider Notes (Signed)
[name]    CSN: [id number] Arrival date & time: 12/11/20  0834      History   Chief Complaint Chief Complaint  Patient presents with   Joint Swelling    HPI Tiffany Herrera is a 30 y.o. female.   Patient presents with right elbow pain that started approximately 3 days ago.  Patient denies any recent or past injury to the elbow.  Patient reports that she does exercise multiple times weekly but has not performed any new exercises that would elicit pain.  Patient has limited range of motion of elbow and is unable to fully extend elbow.  Denies any numbness or tingling to elbow.  Patient has taken Tylenol with minimal improvement in pain relief.    History reviewed. No pertinent past medical history.  There are no problems to display for this patient.   History reviewed. No pertinent surgical history.  OB History   No obstetric history on file.      Home Medications    Prior to Admission medications   Medication Sig Start Date End Date Taking? Authorizing Provider  ibuprofen (ADVIL) 600 MG tablet Take 1 tablet (600 mg total) by mouth every 6 (six) hours as needed for mild pain. 12/11/20  Yes Lance Muss, FNP  naproxen (NAPROSYN) 500 MG tablet Take 1 tablet (500 mg total) by mouth 2 (two) times daily as needed. 11/01/20   Particia Nearing, PA-C  phenazopyridine (PYRIDIUM) 200 MG tablet Take 1 tablet (200 mg total) by mouth 3 (three) times daily as needed for pain. 03/15/20   Mesner, Barbara Cower, MD  [employer] 150-35 MCG/24HR transdermal patch APPLY 1 PATCH TOPICALLY ONCE A WEEK 08/31/18   [provider]  albuterol (PROVENTIL HFA;VENTOLIN HFA) 108 (90 Base) MCG/ACT inhaler Inhale 2 puffs into the lungs every 4 (four) hours as needed for shortness of breath (cough). 05/14/18 09/21/18  Azalia Bilis, MD    Family History Family History  Problem Relation Age of Onset   Healthy Mother    Healthy Father     Social History Social History   Tobacco Use    Smoking status: Never   Smokeless tobacco: Never  Vaping Use   Vaping Use: Never used  Substance Use Topics   Alcohol use: No   Drug use: No     Allergies   Patient has no known allergies.   Review of Systems Review of Systems Per HPI  Physical Exam Triage Vital Signs ED Triage Vitals  Enc Vitals Group     BP 12/11/20 0855 112/70     Pulse Rate 12/11/20 0855 73     Resp 12/11/20 0855 14     Temp 12/11/20 0855 98.8 F (37.1 C)     Temp Source 12/11/20 0855 Oral     SpO2 12/11/20 0855 96 %     Weight --      Height --      Head Circumference --      Peak Flow --      Pain Score 12/11/20 0906 9     Pain Loc --      Pain Edu? --      Excl. in GC? --    No data found.  Updated Vital Signs BP 112/70 (BP Location: Left Arm)   Pulse 73   Temp 98.8 F (37.1 C) (Oral)   Resp 14   SpO2 96%   Visual Acuity Right Eye Distance:   Left Eye Distance:   Bilateral Distance:  Right Eye Near:   Left Eye Near:    Bilateral Near:     Physical Exam Constitutional:      General: She is not in acute distress.    Appearance: Normal appearance. She is not toxic-appearing or diaphoretic.  HENT:     Head: Normocephalic and atraumatic.  Eyes:     Extraocular Movements: Extraocular movements intact.     Conjunctiva/sclera: Conjunctivae normal.  Pulmonary:     Effort: Pulmonary effort is normal.  Musculoskeletal:     Right elbow: No swelling or deformity. Decreased range of motion. Tenderness present.     Left elbow: Normal.     Comments: No tenderness to palpation.  Patient reports the pain occurs with movement.  Has limited range of motion and is not able to fully extend elbow.  No swelling noted.  No erythema noted.  No warmth to touch.  Neurovascular intact.  Skin:    General: Skin is warm and dry.  Neurological:     General: No focal deficit present.     Mental Status: She is alert and oriented to person, place, and time. Mental status is at baseline.   Psychiatric:        Mood and Affect: Mood normal.        Behavior: Behavior normal.        Thought Content: Thought content normal.        Judgment: Judgment normal.     UC Treatments / Results  Labs (all labs ordered are listed, but only abnormal results are displayed) Labs Reviewed - No data to display  EKG   Radiology DG Elbow Complete Right  Result Date: 12/11/2020 CLINICAL DATA:  Right elbow pain without injury. EXAM: RIGHT ELBOW - COMPLETE 3+ VIEW COMPARISON:  None. FINDINGS: There is no evidence of fracture, dislocation, or joint effusion. There is no evidence of arthropathy or other focal bone abnormality. Soft tissues are unremarkable. IMPRESSION: Negative. Electronically Signed   By: Lupita Raider M.D.   On: 12/11/2020 10:00    Procedures Procedures (including critical care time)  Medications Ordered in UC Medications - No data to display  Initial Impression / Assessment and Plan / UC Course  I have reviewed the triage vital signs and the nursing notes.  Pertinent labs & imaging results that were available during my care of the patient were reviewed by me and considered in my medical decision making (see chart for details).     Right elbow x-ray was negative for any acute bony abnormality.  Differential diagnoses include epicondylitis, arthritis, gout.  Will treat with ibuprofen to decrease inflammation and pain.  Patient to apply ice.  Advised patient to follow-up with provided contact information for orthopedist if pain persists over the next 1.5 to 2 weeks.  No red flags on exam.Discussed strict return precautions. Patient verbalized understanding and is agreeable with plan.  Final Clinical Impressions(s) / UC Diagnoses   Final diagnoses:  Right elbow pain     Discharge Instructions      The x-ray of your right elbow was normal.  You have been prescribed ibuprofen to take to decrease inflammation and pain.  Please do not take any additional ibuprofen,  Advil, Aleve, naproxen while taking this ibuprofen.  Also use ice application to elbow.  Follow-up with provided contact information for orthopedist if pain persists over the next 1.5 to 2 weeks.     ED Prescriptions     Medication Sig Dispense Auth. Provider   ibuprofen (ADVIL)  600 MG tablet Take 1 tablet (600 mg total) by mouth every 6 (six) hours as needed for mild pain. 30 tablet Lance Muss, FNP      PDMP not reviewed this encounter.   Lance Muss, FNP 12/11/20 1022

## 2021-01-13 ENCOUNTER — Encounter (HOSPITAL_BASED_OUTPATIENT_CLINIC_OR_DEPARTMENT_OTHER): Payer: Self-pay | Admitting: Emergency Medicine

## 2021-01-13 ENCOUNTER — Emergency Department (HOSPITAL_BASED_OUTPATIENT_CLINIC_OR_DEPARTMENT_OTHER)
Admission: EM | Admit: 2021-01-13 | Discharge: 2021-01-13 | Disposition: A | Discharge status: Home / Self Care | Payer: Medicaid Other | Attending: Emergency Medicine | Admitting: Emergency Medicine

## 2021-01-13 ENCOUNTER — Other Ambulatory Visit: Payer: Self-pay

## 2021-01-13 ENCOUNTER — Emergency Department (HOSPITAL_BASED_OUTPATIENT_CLINIC_OR_DEPARTMENT_OTHER): Payer: Medicaid Other

## 2021-01-13 ENCOUNTER — Ambulatory Visit
Admission: EM | Admit: 2021-01-13 | Discharge: 2021-01-13 | Discharge status: Against Medical Advice | Payer: Medicaid Other

## 2021-01-13 DIAGNOSIS — K219 Gastro-esophageal reflux disease without esophagitis: Secondary | ICD-10-CM | POA: Insufficient documentation

## 2021-01-13 DIAGNOSIS — R079 Chest pain, unspecified: Secondary | ICD-10-CM | POA: Diagnosis present

## 2021-01-13 LAB — BASIC METABOLIC PANEL
Anion gap: 8 (ref 5–15)
BUN: 14 mg/dL (ref 6–20)
CO2: 26 mmol/L (ref 22–32)
Calcium: 9 mg/dL (ref 8.9–10.3)
Chloride: 99 mmol/L (ref 98–111)
Creatinine, Ser: 0.88 mg/dL (ref 0.44–1.00)
GFR, Estimated: >60 mL/min (ref >60)
Glucose, Bld: 96 mg/dL (ref 70–99)
Potassium: 3.8 mmol/L (ref 3.5–5.1)
Sodium: 133 mmol/L — ABNORMAL LOW (ref 135–145)

## 2021-01-13 LAB — CBC
HCT: 36.8 % (ref 36.0–46.0)
Hemoglobin: 12 g/dL (ref 12.0–15.0)
MCH: 27.8 pg (ref 26.0–34.0)
MCHC: 32.6 g/dL (ref 30.0–36.0)
MCV: 85.4 fL (ref 80.0–100.0)
Platelets: 262 10*3/uL (ref 150–400)
RBC: 4.31 MIL/uL (ref 3.87–5.11)
RDW: 13.2 % (ref 11.5–15.5)
WBC: 4.5 10*3/uL (ref 4.0–10.5)
nRBC: 0 % (ref 0.0–0.2)

## 2021-01-13 LAB — TROPONIN I (HIGH SENSITIVITY): Troponin I (High Sensitivity): 5 ng/L (ref <18)

## 2021-01-13 LAB — PREGNANCY, URINE: Preg Test, Ur: NEGATIVE

## 2021-01-13 MED ORDER — SUCRALFATE 1 GM/10ML PO SUSP: 1.0000 g | Freq: Three times a day (TID) | ORAL | 0 refills | Status: DC

## 2021-01-13 MED ORDER — OMEPRAZOLE 20 MG PO CPDR: 20.0000 mg | DELAYED_RELEASE_CAPSULE | Freq: Every day | ORAL | 0 refills | Status: DC

## 2021-01-13 MED ORDER — ALUM & MAG HYDROXIDE-SIMETH 200-200-20 MG/5ML PO SUSP
30.0000 mL | Freq: Once | ORAL | Status: AC
  Administered 2021-01-13: 30 mL via ORAL
  Filled 2021-01-13: qty 30

## 2021-01-13 NOTE — ED Provider Notes (Signed)
[facility] Provider Note   CSN: [id number] Arrival date & time: 01/13/21  1239     History Chief Complaint  Patient presents with   Chest Pain    Tiffany Herrera is a 30 y.o. female.  Patient with no pertinent past medical history presents today with chief complaint of chest pain.  She states that same began yesterday evening upon lying down following eating several hot wings.  Pain is located in the center of her chest, it is pleuritic in nature and reproducible on palpation.  She states that pain was initially 10/10, however has improved.  She attempted Tums without relief.  States that she was up most of the night due to pain and that lying down makes it worse.  Pain does not radiate and is burning in nature.  She denies any cardiac history, or family history of cardiac problems at a young age.  She does not smoke, no recreational drugs, social alcohol consumption only.  Denies any recent significant NSAID use.  Denies fevers, chills, shortness of breath, nausea, vomiting, diarrhea.  No history of similar episodes.  No recent illness.  The history is provided by the patient. No language interpreter was used.  Chest Pain Associated symptoms: no abdominal pain, no cough, no dizziness, no fever, no headache, no nausea, no numbness, no palpitations, no shortness of breath, no vomiting and no weakness       History reviewed. No pertinent past medical history.  There are no problems to display for this patient.   History reviewed. No pertinent surgical history.   OB History   No obstetric history on file.     Family History  Problem Relation Age of Onset   Healthy Mother    Healthy Father     Social History   Tobacco Use   Smoking status: Never   Smokeless tobacco: Never  Vaping Use   Vaping Use: Never used  Substance Use Topics   Alcohol use: No   Drug use: No    Home Medications Prior to Admission medications   Medication Sig  Start Date End Date Taking? Authorizing Provider  ibuprofen (ADVIL) 600 MG tablet Take 1 tablet (600 mg total) by mouth every 6 (six) hours as needed for mild pain. 12/11/20   Teodora Medici, FNP  naproxen (NAPROSYN) 500 MG tablet Take 1 tablet (500 mg total) by mouth 2 (two) times daily as needed. 11/01/20   Volney American, PA-C  phenazopyridine (PYRIDIUM) 200 MG tablet Take 1 tablet (200 mg total) by mouth 3 (three) times daily as needed for pain. 03/15/20   Mesner, Corene Cornea, MD  Marilu Favre 150-35 MCG/24HR transdermal patch APPLY 1 PATCH TOPICALLY ONCE A WEEK 08/31/18   [provider]  albuterol (PROVENTIL HFA;VENTOLIN HFA) 108 (90 Base) MCG/ACT inhaler Inhale 2 puffs into the lungs every 4 (four) hours as needed for shortness of breath (cough). 05/14/18 09/21/18  Jola Schmidt, MD    Allergies    Patient has no known allergies.  Review of Systems   Review of Systems  Constitutional:  Negative for chills and fever.  HENT:  Negative for congestion, postnasal drip and rhinorrhea.   Respiratory:  Negative for cough, shortness of breath, wheezing and stridor.   Cardiovascular:  Positive for chest pain. Negative for palpitations and leg swelling.  Gastrointestinal:  Negative for abdominal pain, anal bleeding, constipation, diarrhea, nausea and vomiting.  Skin:  Negative for rash.  Neurological:  Negative for dizziness, tremors, seizures, syncope, facial  asymmetry, speech difficulty, weakness, light-headedness, numbness and headaches.  Psychiatric/Behavioral:  Negative for decreased concentration and dysphoric mood.   All other systems reviewed and are negative.  Physical Exam Updated Vital Signs BP (!) 126/104   Pulse 87   Temp 98.3 F (36.8 C) (Oral)   Resp (!) 21   Ht 5\' 6"  (1.676 m)   Wt 85.3 kg   LMP 01/06/2021   SpO2 100%   BMI 30.34 kg/m   Physical Exam Vitals and nursing note reviewed.  Constitutional:      General: She is not in acute distress.    Appearance: She is  well-developed and normal weight. She is not ill-appearing, toxic-appearing or diaphoretic.     Comments: Found to be resting comfortably in bed watching football in no apparent distress.  HENT:     Head: Normocephalic and atraumatic.  Cardiovascular:     Rate and Rhythm: Normal rate and regular rhythm.     Pulses:          Radial pulses are 2+ on the right side and 2+ on the left side.     Heart sounds: Normal heart sounds.  Pulmonary:     Effort: Pulmonary effort is normal.     Breath sounds: Normal breath sounds.  Abdominal:     Palpations: Abdomen is soft.  Musculoskeletal:        General: Normal range of motion.     Cervical back: Normal range of motion and neck supple.     Right lower leg: No tenderness. No edema.     Left lower leg: No tenderness. No edema.  Skin:    General: Skin is warm and dry.  Neurological:     General: No focal deficit present.     Mental Status: She is alert.  Psychiatric:        Mood and Affect: Mood normal.        Behavior: Behavior normal.    ED Results / Procedures / Treatments   Labs (all labs ordered are listed, but only abnormal results are displayed) Labs Reviewed  BASIC METABOLIC PANEL - Abnormal; Notable for the following components:      Result Value   Sodium 133 (*)    All other components within normal limits  CBC  PREGNANCY, URINE  TROPONIN I (HIGH SENSITIVITY)    EKG None  Radiology DG Chest 2 View  Result Date: 01/13/2021 CLINICAL DATA:  Chest pain EXAM: CHEST - 2 VIEW COMPARISON:  12/11/2017 FINDINGS: Heart size upper normal. Normal vascularity. Lungs clear without infiltrate or effusion. No skeletal abnormality. IMPRESSION: No active cardiopulmonary disease. Electronically Signed   By: Franchot Gallo M.D.   On: 01/13/2021 13:50    Procedures Procedures   Medications Ordered in ED Medications  alum & mag hydroxide-simeth (MAALOX/MYLANTA) 200-200-20 MG/5ML suspension 30 mL (30 mLs Oral Given 01/13/21 1846)    ED  Course  I have reviewed the triage vital signs and the nursing notes.  Pertinent labs & imaging results that were available during my care of the patient were reviewed by me and considered in my medical decision making (see chart for details).    MDM Rules/Calculators/A&P                         Given the large differential diagnosis for [facility], the decision making in this case is of high complexity.  After evaluating all of the data points in this case, the presentation of  Tiffany Herrera is NOT consistent with Acute Coronary Syndrome (ACS) and/or myocardial ischemia, pulmonary embolism, aortic dissection; Tiffany Herrera, significant arrythmia, pneumothorax, cardiac tamponade, or other emergent cardiopulmonary condition.  Further, the presentation of Tiffany Herrera is NOT consistent with pericarditis, myocarditis, cholecystitis, pancreatitis, mediastinitis, endocarditis, new valvular disease.  Additionally, the presentation of Tiffany Herrera NOT consistent with flail chest, cardiac contusion, ARDS, or significant intra-thoracic or intra-abdominal bleeding.  Moreover, this presentation is NOT consistent with pneumonia, sepsis, or pyelonephritis.  The patient has   chest burning following lying down after ingestion of hot wings.  Negative cardiac work-up, troponin negative, EKG NSR, chest x-ray without abnormality.  GI cocktail provided relief, therefore suspect that patient's symptoms are due to GERD.  Will treat for same and recommend follow-up with PCP for further management.   Strict return and follow-up precautions have been given by me personally or by detailed written instruction given verbally by nursing staff using the teach back method to the patient/family/caregiver(s).  Data Reviewed/Counseling: I have reviewed the patient's vital signs, nursing notes, and other relevant tests/information. I had a detailed discussion regarding the historical points, exam findings, and  any diagnostic results supporting the discharge diagnosis. I also discussed the need for outpatient follow-up and the need to return to the ED if symptoms worsen or if there are any questions or concerns that arise at home.   Final Clinical Impression(s) / ED Diagnoses Final diagnoses:  Gastroesophageal reflux disease, unspecified whether esophagitis present    Rx / DC Orders ED Discharge Orders          Ordered    omeprazole (PRILOSEC) 20 MG capsule  Daily        01/13/21 1943    sucralfate (CARAFATE) 1 GM/10ML suspension  3 times daily with meals & bedtime        01/13/21 1943          An After Visit Summary was printed and given to the patient.    Vear Clock 01/13/21 1945    Vanetta Mulders, MD 01/25/21 [phone]

## 2021-01-13 NOTE — ED Triage Notes (Signed)
Pt arrives pov, ambulatory to triage with c/o non radiating mid-sternal CP that started last night when at rest.

## 2021-01-13 NOTE — Discharge Instructions (Addendum)
You were seen today for evaluation of your chest pain.  Work-up for this today was unremarkable for concerning findings.  Considering that your pain initiated following hot wings and upon lying down, suspect that your symptoms are due to GERD.  I have prescribed a medication for management of your reflux.  Please take this as prescribed.  Additionally please take your omeprazole 30 minutes prior to eating meals to increase effectiveness.  Follow-up with your primary care in the next few days for continued symptom management.  Return if development of new or worsening symptoms.

## 2021-05-01 ENCOUNTER — Ambulatory Visit
Admission: EM | Admit: 2021-05-01 | Discharge: 2021-05-01 | Disposition: A | Discharge status: Home / Self Care | Payer: Medicaid Other | Attending: Physician Assistant | Admitting: Physician Assistant

## 2021-05-01 DIAGNOSIS — H6121 Impacted cerumen, right ear: Secondary | ICD-10-CM

## 2021-05-01 DIAGNOSIS — M79641 Pain in right hand: Secondary | ICD-10-CM | POA: Diagnosis not present

## 2021-05-01 DIAGNOSIS — M79642 Pain in left hand: Secondary | ICD-10-CM

## 2021-05-01 MED ORDER — PREDNISONE 20 MG PO TABS: 40.0000 mg | ORAL_TABLET | Freq: Every day | ORAL | 0 refills | Status: AC

## 2021-05-01 NOTE — ED Provider Notes (Signed)
[name]    CSN: [id number] Arrival date & time: 05/01/21  1758      History   Chief Complaint Chief Complaint  Patient presents with   ear and hands    HPI Tiffany Herrera is a 31 y.o. female.   Patient here today for evaluation of right ear fullness that started recently.  She has not had fever.  She denies significant pain in her ear.  She denies any left ear issues.  She denies any nasal congestion or cough.  She does not report treatment for symptoms.  She also reports inflammation to bilateral hands.  She notes that its as if her hands are more swollen and she is having difficulty fully making a fist due to same.  This started a few days ago.  There is no reported injury.  The history is provided by the patient.   History reviewed. No pertinent past medical history.  There are no problems to display for this patient.   History reviewed. No pertinent surgical history.  OB History   No obstetric history on file.      Home Medications    Prior to Admission medications   Medication Sig Start Date End Date Taking? Authorizing Provider  predniSONE (DELTASONE) 20 MG tablet Take 2 tablets (40 mg total) by mouth daily with breakfast for 5 days. 05/01/21 05/06/21 Yes Tomi Bamberger, PA-C  ibuprofen (ADVIL) 600 MG tablet Take 1 tablet (600 mg total) by mouth every 6 (six) hours as needed for mild pain. 12/11/20   Gustavus Bryant, FNP  naproxen (NAPROSYN) 500 MG tablet Take 1 tablet (500 mg total) by mouth 2 (two) times daily as needed. 11/01/20   Particia Nearing, PA-C  omeprazole (PRILOSEC) 20 MG capsule Take 1 capsule (20 mg total) by mouth daily for 14 days. 01/13/21 01/27/21  Smoot, Shawn Route, PA-C  phenazopyridine (PYRIDIUM) 200 MG tablet Take 1 tablet (200 mg total) by mouth 3 (three) times daily as needed for pain. 03/15/20   Mesner, Barbara Cower, MD  sucralfate (CARAFATE) 1 GM/10ML suspension Take 10 mLs (1 g total) by mouth 4 (four) times daily -  with meals  and at bedtime. 01/13/21   Smoot, Shawn Route, PA-C  XULANE 150-35 MCG/24HR transdermal patch APPLY 1 PATCH TOPICALLY ONCE A WEEK 08/31/18   [provider]  albuterol (PROVENTIL HFA;VENTOLIN HFA) 108 (90 Base) MCG/ACT inhaler Inhale 2 puffs into the lungs every 4 (four) hours as needed for shortness of breath (cough). 05/14/18 09/21/18  Azalia Bilis, MD    Family History Family History  Problem Relation Age of Onset   Healthy Mother    Healthy Father     Social History Social History   Tobacco Use   Smoking status: Never   Smokeless tobacco: Never  Vaping Use   Vaping Use: Never used  Substance Use Topics   Alcohol use: No   Drug use: No     Allergies   Patient has no known allergies.   Review of Systems Review of Systems  Constitutional:  Negative for chills and fever.  HENT:  Positive for ear discharge. Negative for congestion, ear pain, rhinorrhea and sore throat.   Eyes:  Negative for discharge and redness.  Respiratory:  Negative for cough and shortness of breath.   Gastrointestinal:  Negative for abdominal pain, nausea and vomiting.  Musculoskeletal:  Positive for arthralgias.    Physical Exam Triage Vital Signs ED Triage Vitals  Enc Vitals Group  BP      Pulse      Resp      Temp      Temp src      SpO2      Weight      Height      Head Circumference      Peak Flow      Pain Score      Pain Loc      Pain Edu?      Excl. in GC?    No data found.  Updated Vital Signs BP 121/78 (BP Location: Left Arm)    Pulse 96    Temp 98.6 F (37 C) (Oral)    Resp 18    SpO2 97%      Physical Exam Vitals and nursing note reviewed.  Constitutional:      General: She is not in acute distress.    Appearance: Normal appearance. She is not ill-appearing.  HENT:     Head: Normocephalic and atraumatic.     Right Ear: There is impacted cerumen.     Left Ear: Tympanic membrane and ear canal normal.  Eyes:     Conjunctiva/sclera: Conjunctivae normal.   Cardiovascular:     Rate and Rhythm: Normal rate.  Pulmonary:     Effort: Pulmonary effort is normal.  Neurological:     Mental Status: She is alert.  Psychiatric:        Mood and Affect: Mood normal.        Behavior: Behavior normal.        Thought Content: Thought content normal.     UC Treatments / Results  Labs (all labs ordered are listed, but only abnormal results are displayed) Labs Reviewed - No data to display  EKG   Radiology No results found.  Procedures Procedures (including critical care time)  Medications Ordered in UC Medications - No data to display  Initial Impression / Assessment and Plan / UC Course  I have reviewed the triage vital signs and the nursing notes.  Pertinent labs & imaging results that were available during my care of the patient were reviewed by me and considered in my medical decision making (see chart for details).  Ear fullness resolved after irrigation of right ear.  Unknown etiology of pain in hands but will treat with steroids to hopefully decrease inflammation and improve symptoms.  Recommended follow-up with her PCP for further evaluation.  Final Clinical Impressions(s) / UC Diagnoses   Final diagnoses:  Impacted cerumen of right ear  Pain in both hands   Discharge Instructions   None    ED Prescriptions     Medication Sig Dispense Auth. Provider   predniSONE (DELTASONE) 20 MG tablet Take 2 tablets (40 mg total) by mouth daily with breakfast for 5 days. 10 tablet Tomi Bamberger, PA-C      PDMP not reviewed this encounter.   Tomi Bamberger, PA-C 05/02/21 [phone]

## 2021-05-01 NOTE — ED Triage Notes (Signed)
Pt c/o not being able to hear out of right ear after tx from a different UC.   Pt also c/o bilat hand discoloration onset last Friday.

## 2021-05-12 ENCOUNTER — Encounter (HOSPITAL_BASED_OUTPATIENT_CLINIC_OR_DEPARTMENT_OTHER): Payer: Self-pay | Admitting: Emergency Medicine

## 2021-05-12 ENCOUNTER — Emergency Department (HOSPITAL_BASED_OUTPATIENT_CLINIC_OR_DEPARTMENT_OTHER): Payer: Medicaid Other

## 2021-05-12 ENCOUNTER — Other Ambulatory Visit: Payer: Self-pay

## 2021-05-12 ENCOUNTER — Emergency Department (HOSPITAL_BASED_OUTPATIENT_CLINIC_OR_DEPARTMENT_OTHER)
Admission: EM | Admit: 2021-05-12 | Discharge: 2021-05-12 | Disposition: A | Discharge status: Home / Self Care | Payer: Medicaid Other | Attending: Emergency Medicine | Admitting: Emergency Medicine

## 2021-05-12 DIAGNOSIS — R509 Fever, unspecified: Secondary | ICD-10-CM | POA: Insufficient documentation

## 2021-05-12 DIAGNOSIS — R0789 Other chest pain: Secondary | ICD-10-CM | POA: Insufficient documentation

## 2021-05-12 DIAGNOSIS — R000 Tachycardia, unspecified: Secondary | ICD-10-CM | POA: Diagnosis not present

## 2021-05-12 DIAGNOSIS — R8271 Bacteriuria: Secondary | ICD-10-CM

## 2021-05-12 DIAGNOSIS — Z20822 Contact with and (suspected) exposure to covid-19: Secondary | ICD-10-CM | POA: Diagnosis not present

## 2021-05-12 DIAGNOSIS — R079 Chest pain, unspecified: Secondary | ICD-10-CM

## 2021-05-12 LAB — LACTIC ACID, PLASMA: Lactic Acid, Venous: 1.5 mmol/L (ref 0.5–1.9)

## 2021-05-12 LAB — URINALYSIS, ROUTINE W REFLEX MICROSCOPIC
Bilirubin Urine: NEGATIVE
Glucose, UA: NEGATIVE mg/dL
Ketones, ur: NEGATIVE mg/dL
Leukocytes,Ua: NEGATIVE
Nitrite: NEGATIVE
Protein, ur: NEGATIVE mg/dL
Specific Gravity, Urine: 1.01 (ref 1.005–1.030)
pH: 7 (ref 5.0–8.0)

## 2021-05-12 LAB — CBC WITH DIFFERENTIAL/PLATELET
Abs Immature Granulocytes: 0.05 10*3/uL (ref 0.00–0.07)
Basophils Absolute: 0 10*3/uL (ref 0.0–0.1)
Basophils Relative: 0 %
Eosinophils Absolute: 0 10*3/uL (ref 0.0–0.5)
Eosinophils Relative: 0 %
HCT: 36.7 % (ref 36.0–46.0)
Hemoglobin: 12.2 g/dL (ref 12.0–15.0)
Immature Granulocytes: 1 %
Lymphocytes Relative: 17 %
Lymphs Abs: 1.1 10*3/uL (ref 0.7–4.0)
MCH: 27.4 pg (ref 26.0–34.0)
MCHC: 33.2 g/dL (ref 30.0–36.0)
MCV: 82.3 fL (ref 80.0–100.0)
Monocytes Absolute: 1.2 10*3/uL — ABNORMAL HIGH (ref 0.1–1.0)
Monocytes Relative: 20 %
Neutro Abs: 3.8 10*3/uL (ref 1.7–7.7)
Neutrophils Relative %: 62 %
Platelets: 298 10*3/uL (ref 150–400)
RBC: 4.46 MIL/uL (ref 3.87–5.11)
RDW: 13.7 % (ref 11.5–15.5)
WBC: 6.1 10*3/uL (ref 4.0–10.5)
nRBC: 0 % (ref 0.0–0.2)

## 2021-05-12 LAB — COMPREHENSIVE METABOLIC PANEL
ALT: 15 U/L (ref 0–44)
AST: 22 U/L (ref 15–41)
Albumin: 3 g/dL — ABNORMAL LOW (ref 3.5–5.0)
Alkaline Phosphatase: 36 U/L — ABNORMAL LOW (ref 38–126)
Anion gap: 11 (ref 5–15)
BUN: 11 mg/dL (ref 6–20)
CO2: 23 mmol/L (ref 22–32)
Calcium: 8.9 mg/dL (ref 8.9–10.3)
Chloride: 97 mmol/L — ABNORMAL LOW (ref 98–111)
Creatinine, Ser: 0.91 mg/dL (ref 0.44–1.00)
GFR, Estimated: >60 mL/min (ref >60)
Glucose, Bld: 126 mg/dL — ABNORMAL HIGH (ref 70–99)
Potassium: 3.9 mmol/L (ref 3.5–5.1)
Sodium: 131 mmol/L — ABNORMAL LOW (ref 135–145)
Total Bilirubin: 0.6 mg/dL (ref 0.3–1.2)
Total Protein: 8.9 g/dL — ABNORMAL HIGH (ref 6.5–8.1)

## 2021-05-12 LAB — RESP PANEL BY RT-PCR (FLU A&B, COVID) ARPGX2
Influenza A by PCR: NEGATIVE
Influenza B by PCR: NEGATIVE
SARS Coronavirus 2 by RT PCR: NEGATIVE

## 2021-05-12 LAB — URINALYSIS, MICROSCOPIC (REFLEX)

## 2021-05-12 LAB — PREGNANCY, URINE: Preg Test, Ur: NEGATIVE

## 2021-05-12 LAB — TROPONIN I (HIGH SENSITIVITY)
Troponin I (High Sensitivity): 10 ng/L (ref <18)
Troponin I (High Sensitivity): 9 ng/L (ref <18)

## 2021-05-12 LAB — D-DIMER, QUANTITATIVE: D-Dimer, Quant: 1.03 ug/mL-FEU — ABNORMAL HIGH (ref 0.00–0.50)

## 2021-05-12 MED ORDER — SODIUM CHLORIDE 0.9 % IV BOLUS
1000.0000 mL | Freq: Once | INTRAVENOUS | Status: AC
  Administered 2021-05-12: 1000 mL via INTRAVENOUS

## 2021-05-12 MED ORDER — CEPHALEXIN 500 MG PO CAPS: 500.0000 mg | ORAL_CAPSULE | Freq: Three times a day (TID) | ORAL | 0 refills | Status: AC

## 2021-05-12 MED ORDER — ACETAMINOPHEN 325 MG PO TABS
650.0000 mg | ORAL_TABLET | Freq: Once | ORAL | Status: AC
  Administered 2021-05-12: 650 mg via ORAL
  Filled 2021-05-12: qty 2

## 2021-05-12 MED ORDER — IBUPROFEN 400 MG PO TABS
600.0000 mg | ORAL_TABLET | Freq: Once | ORAL | Status: AC
  Administered 2021-05-12: 600 mg via ORAL
  Filled 2021-05-12: qty 1

## 2021-05-12 MED ORDER — CEPHALEXIN 500 MG PO CAPS: 500.0000 mg | ORAL_CAPSULE | Freq: Three times a day (TID) | ORAL | 0 refills | Status: DC

## 2021-05-12 MED ORDER — IOHEXOL 350 MG/ML SOLN
100.0000 mL | Freq: Once | INTRAVENOUS | Status: AC | PRN
  Administered 2021-05-12: 100 mL via INTRAVENOUS

## 2021-05-12 NOTE — ED Notes (Signed)
Patient transported to CT 

## 2021-05-12 NOTE — ED Triage Notes (Signed)
CP and [facility] since Friday. Pt states she tried tums and motrin. Temp 102.4 on arrival. RR 36. Reports pain on inspiration, and orthopnea.  ?

## 2021-05-12 NOTE — ED Provider Notes (Signed)
[facility] EMERGENCY DEPARTMENT Provider Note   CSN: [id number] Arrival date & time: 05/12/21  0050     History  Chief Complaint  Patient presents with   Shortness of Breath    Tiffany Herrera is a 31 y.o. female.  Patient presents to ER chief complaint of chest tightness and fevers with shortness of breath.  Symptoms ongoing for 2 days now denies sore throat or runny nose.  Denies cough.  Denies vomiting or diarrhea.  Chest pains described as an ache in the mid chest nonradiating persistent nonexertional.      Home Medications Prior to Admission medications   Medication Sig Start Date End Date Taking? Authorizing Provider  cephALEXin (KEFLEX) 500 MG capsule Take 1 capsule (500 mg total) by mouth 3 (three) times daily for 7 days. 05/12/21 05/19/21 Yes Cheryll Cockayne, MD  ibuprofen (ADVIL) 600 MG tablet Take 1 tablet (600 mg total) by mouth every 6 (six) hours as needed for mild pain. 12/11/20   Gustavus Bryant, FNP  naproxen (NAPROSYN) 500 MG tablet Take 1 tablet (500 mg total) by mouth 2 (two) times daily as needed. 11/01/20   Particia Nearing, PA-C  omeprazole (PRILOSEC) 20 MG capsule Take 1 capsule (20 mg total) by mouth daily for 14 days. 01/13/21 01/27/21  Smoot, Shawn Route, PA-C  phenazopyridine (PYRIDIUM) 200 MG tablet Take 1 tablet (200 mg total) by mouth 3 (three) times daily as needed for pain. 03/15/20   Mesner, Barbara Cower, MD  sucralfate (CARAFATE) 1 GM/10ML suspension Take 10 mLs (1 g total) by mouth 4 (four) times daily -  with meals and at bedtime. 01/13/21   Smoot, Shawn Route, PA-C  XULANE 150-35 MCG/24HR transdermal patch APPLY 1 PATCH TOPICALLY ONCE A WEEK 08/31/18   [provider]  albuterol (PROVENTIL HFA;VENTOLIN HFA) 108 (90 Base) MCG/ACT inhaler Inhale 2 puffs into the lungs every 4 (four) hours as needed for shortness of breath (cough). 05/14/18 09/21/18  Azalia Bilis, MD      Allergies    Patient has no known allergies.    Review of Systems   Review  of Systems  Constitutional:  Positive for fever.  HENT:  Negative for ear pain.   Eyes:  Negative for pain.  Respiratory:  Negative for cough.   Cardiovascular:  Positive for chest pain.  Gastrointestinal:  Negative for abdominal pain.  Genitourinary:  Negative for flank pain.  Musculoskeletal:  Negative for back pain.  Skin:  Negative for rash.  Neurological:  Negative for headaches.   Physical Exam Updated Vital Signs BP 105/77   Pulse (!) 109   Temp 99.3 F (37.4 C) (Oral)   Resp (!) 21   Ht 5\' 6"  (1.676 m)   Wt 81.6 kg   LMP 04/28/2021   SpO2 97%   BMI 29.05 kg/m  Physical Exam Constitutional:      General: She is not in acute distress.    Appearance: Normal appearance.  HENT:     Head: Normocephalic.     Nose: Nose normal.  Eyes:     Extraocular Movements: Extraocular movements intact.  Cardiovascular:     Rate and Rhythm: Tachycardia present.  Pulmonary:     Effort: Pulmonary effort is normal.     Breath sounds: No decreased breath sounds, wheezing, rhonchi or rales.  Musculoskeletal:        General: Normal range of motion.     Cervical back: Normal range of motion.  Neurological:     General:  No focal deficit present.     Mental Status: She is alert. Mental status is at baseline.    ED Results / Procedures / Treatments   Labs (all labs ordered are listed, but only abnormal results are displayed) Labs Reviewed  COMPREHENSIVE METABOLIC PANEL - Abnormal; Notable for the following components:      Result Value   Sodium 131 (*)    Chloride 97 (*)    Glucose, Bld 126 (*)    Total Protein 8.9 (*)    Albumin 3.0 (*)    Alkaline Phosphatase 36 (*)    All other components within normal limits  CBC WITH DIFFERENTIAL/PLATELET - Abnormal; Notable for the following components:   Monocytes Absolute 1.2 (*)    All other components within normal limits  URINALYSIS, ROUTINE W REFLEX MICROSCOPIC - Abnormal; Notable for the following components:   APPearance HAZY  (*)    Hgb urine dipstick LARGE (*)    All other components within normal limits  D-DIMER, QUANTITATIVE - Abnormal; Notable for the following components:   D-Dimer, Quant 1.03 (*)    All other components within normal limits  URINALYSIS, MICROSCOPIC (REFLEX) - Abnormal; Notable for the following components:   Bacteria, UA MANY (*)    All other components within normal limits  RESP PANEL BY RT-PCR (FLU A&B, COVID) ARPGX2  LACTIC ACID, PLASMA  PREGNANCY, URINE  TROPONIN I (HIGH SENSITIVITY)  TROPONIN I (HIGH SENSITIVITY)    EKG EKG Interpretation  Date/Time:  Sunday May 12 2021 01:00:01 EST Ventricular Rate:  119 PR Interval:  183 QRS Duration: 74 QT Interval:  291 QTC Calculation: 410 R Axis:   92 Text Interpretation: Sinus tachycardia Right atrial enlargement Borderline right axis deviation Low voltage, precordial leads ST elevation, consider inferior injury Baseline wander in lead(s) II III aVL aVF Confirmed by Norman Clay ([id number]) on 05/12/2021 1:08:12 AM  Radiology DG Chest 2 View  Result Date: 05/12/2021 CLINICAL DATA:  Chest pain and shortness of breath. EXAM: CHEST - 2 VIEW COMPARISON:  01/13/2021 FINDINGS: Low lung volumes.The cardiomediastinal contours are normal. The lungs are clear. Pulmonary vasculature is normal. No consolidation, pleural effusion, or pneumothorax. No acute osseous abnormalities are seen. IMPRESSION: Low lung volumes without acute chest finding. Electronically Signed   By: Narda Rutherford M.D.   On: 05/12/2021 01:40   CT Angio Chest PE W and/or Wo Contrast  Result Date: 05/12/2021 CLINICAL DATA:  Chest pain and shortness of breath for 2 days, initial encounter EXAM: CT ANGIOGRAPHY CHEST WITH CONTRAST TECHNIQUE: Multidetector CT imaging of the chest was performed using the standard protocol during bolus administration of intravenous contrast. Multiplanar CT image reconstructions and MIPs were obtained to evaluate the vascular anatomy. RADIATION DOSE  REDUCTION: This exam was performed according to the departmental dose-optimization program which includes automated exposure control, adjustment of the mA and/or kV according to patient size and/or use of iterative reconstruction technique. CONTRAST:  OMNIPAQUE IOHEXOL 350 MG/ML SOLN COMPARISON:  Chest x-ray from earlier in the same day. FINDINGS: Cardiovascular: Thoracic aorta and its branches are well visualize without aneurysmal dilatation or dissection. No cardiac enlargement is noted. No coronary calcifications are seen. The pulmonary artery shows a normal branching pattern. No intraluminal filling defect to suggest pulmonary embolism is seen. Mediastinum/Nodes: Thoracic inlet is within normal limits. No sizable hilar or mediastinal adenopathy is noted. The esophagus as visualized is within normal limits. Symmetrical axillary lymph nodes are seen the majority of which have normal fatty hila. The symmetrical  nature suggestive benign etiology likely reactive in nature. Lungs/Pleura: Lungs are well aerated bilaterally. No focal infiltrate or sizable effusion is seen. Upper Abdomen: Visualized upper abdomen appears within normal limits. Musculoskeletal: No acute bony abnormality is noted. Review of the MIP images confirms the above findings. IMPRESSION: No evidence of pulmonary emboli. No focal infiltrate is seen. Symmetrical axillary lymph nodes which demonstrate normal fatty hila. There E fatty hila and symmetrical nature suggest a more reactive etiology. Electronically Signed   By: Alcide Clever M.D.   On: 05/12/2021 03:09    Procedures Procedures    Medications Ordered in ED Medications  acetaminophen (TYLENOL) tablet 650 mg (650 mg Oral Given 05/12/21 0113)  sodium chloride 0.9 % bolus 1,000 mL (0 mLs Intravenous Stopped 05/12/21 0315)  iohexol (OMNIPAQUE) 350 MG/ML injection 100 mL (100 mLs Intravenous Contrast Given 05/12/21 0236)  ibuprofen (ADVIL) tablet 600 mg (600 mg Oral Given 05/12/21 0450)     ED Course/ Medical Decision Making/ A&P                           Medical Decision Making Amount and/or Complexity of Data Reviewed Independent Historian: spouse External Data Reviewed: notes.    Details: Patient seen in urgent care 2 weeks ago for impacted cerumen. Labs: ordered.    Details: White count is normal troponin is negative and flat x2.  Initial D-dimer elevated. Radiology: ordered.    Details: Chest x-ray shows no evidence of pneumonia.  CT angiogram pursued given elevated D-dimer shows no evidence of pulmonary embolism or pneumonia. ECG/medicine tests: ordered and independent interpretation performed. Decision-making details documented in ED Course.  Risk OTC drugs. Prescription drug management. Risk Details: Patient presents with fever and tachycardia.  No evidence of pneumonia noted.  Consideration given for other viral causes of upper respiratory infection and fever.  Urinalysis positive for bacteria but has some squamous cell count.  Possibility of contaminated sample.  However given her fever and tachycardia we will treat with antibiotics.  Recommending outpatient follow-up with her doctor this week.  Recommending immediate return for worsening symptoms or any additional concerns.           Final Clinical Impression(s) / ED Diagnoses Final diagnoses:  Febrile illness  Chest pain, unspecified type  Bacteria in urine    Rx / DC Orders ED Discharge Orders          Ordered    cephALEXin (KEFLEX) 500 MG capsule  3 times daily        05/12/21 0457              Cheryll Cockayne, MD 05/12/21 [phone]

## 2021-05-12 NOTE — Discharge Instructions (Addendum)
Your urine test showed bacteria in your urine.  Take the antibiotics as written.  Follow-up with your doctor within 3 to 4 days. ? ?Return back to the ER if you have worsening symptoms increased difficulty breathing or any additional concerns. ?

## 2021-05-13 ENCOUNTER — Emergency Department (HOSPITAL_COMMUNITY)
Admission: EM | Admit: 2021-05-13 | Discharge: 2021-05-13 | Disposition: A | Discharge status: Home / Self Care | Payer: Medicaid Other | Attending: Emergency Medicine | Admitting: Emergency Medicine

## 2021-05-13 ENCOUNTER — Other Ambulatory Visit: Payer: Self-pay

## 2021-05-13 ENCOUNTER — Encounter (HOSPITAL_COMMUNITY): Payer: Self-pay

## 2021-05-13 DIAGNOSIS — R0602 Shortness of breath: Secondary | ICD-10-CM | POA: Diagnosis not present

## 2021-05-13 DIAGNOSIS — R5383 Other fatigue: Secondary | ICD-10-CM | POA: Diagnosis not present

## 2021-05-13 DIAGNOSIS — R079 Chest pain, unspecified: Secondary | ICD-10-CM | POA: Insufficient documentation

## 2021-05-13 DIAGNOSIS — R059 Cough, unspecified: Secondary | ICD-10-CM | POA: Insufficient documentation

## 2021-05-13 LAB — TROPONIN I (HIGH SENSITIVITY): Troponin I (High Sensitivity): 9 ng/L (ref <18)

## 2021-05-13 MED ORDER — IBUPROFEN 600 MG PO TABS: 600.0000 mg | ORAL_TABLET | Freq: Four times a day (QID) | ORAL | 0 refills | Status: DC | PRN

## 2021-05-13 MED ORDER — KETOROLAC TROMETHAMINE 30 MG/ML IJ SOLN
15.0000 mg | Freq: Once | INTRAMUSCULAR | Status: AC
  Administered 2021-05-13: 15 mg via INTRAMUSCULAR
  Filled 2021-05-13: qty 1

## 2021-05-13 NOTE — ED Notes (Signed)
Pt verbalized understanding of d/c instructions, meds and followup care. Denies questions. VSS, no distress noted. Steady gait to exit with all belongings.  ?

## 2021-05-13 NOTE — ED Triage Notes (Signed)
Pt arrives POV for eval of CP and SOB since Friday. Was seen at [facility] for same w/ neg w/u. Pt reports relief w/ antiinflammatory but "once it wore off the pain came back". Has not redosed antiinflammatory since initial dose ?

## 2021-05-13 NOTE — Discharge Instructions (Addendum)
the work-up was reassuring today.  Take the anti-inflammatory medicine to help.  If does not improve may need more follow-up and potentially look at other causes such as pericarditis. ?

## 2021-05-14 NOTE — ED Provider Notes (Signed)
?[name] ?Provider Note ? ? ?CSN: [id number] ?Arrival date & time: 05/13/21  0825 ? ?  ? ?History ? ?Chief Complaint  ?Patient presents with  ? Chest Pain  ? ? ?Tiffany Herrera is a 31 y.o. female. ? ? ?Chest Pain ?Associated symptoms: fatigue and shortness of breath   ?Associated symptoms: no abdominal pain   ?Patient presents with chest pain shortness of breath and cough.  Has had for the last few days.  Had been seen at [facility] today before with extensive work-up.  No medicine since then.  States pain is worse when she is lying back.  Occasional cough.  No swelling her legs. ?  ?History reviewed. No pertinent past medical history.  ?Home Medications ?Prior to Admission medications   ?Medication Sig Start Date End Date Taking? Authorizing Provider  ?cephALEXin (KEFLEX) 500 MG capsule Take 1 capsule (500 mg total) by mouth 3 (three) times daily for 7 days. 05/12/21 05/19/21  Cheryll Cockayne, MD  ?ibuprofen (ADVIL) 600 MG tablet Take 1 tablet (600 mg total) by mouth every 6 (six) hours as needed. 05/13/21   Benjiman Core, MD  ?omeprazole (PRILOSEC) 20 MG capsule Take 1 capsule (20 mg total) by mouth daily for 14 days. 01/13/21 01/27/21  Smoot, Shawn Route, PA-C  ?phenazopyridine (PYRIDIUM) 200 MG tablet Take 1 tablet (200 mg total) by mouth 3 (three) times daily as needed for pain. 03/15/20   Mesner, Barbara Cower, MD  ?sucralfate (CARAFATE) 1 GM/10ML suspension Take 10 mLs (1 g total) by mouth 4 (four) times daily -  with meals and at bedtime. 01/13/21   Smoot, Shawn Route, PA-C  ?[employer] 150-35 MCG/24HR transdermal patch APPLY 1 PATCH TOPICALLY ONCE A WEEK 08/31/18   [provider]  ?albuterol (PROVENTIL HFA;VENTOLIN HFA) 108 (90 Base) MCG/ACT inhaler Inhale 2 puffs into the lungs every 4 (four) hours as needed for shortness of breath (cough). 05/14/18 09/21/18  Azalia Bilis, MD  ?   ? ?Allergies    ?Patient has no known allergies.   ? ?Review of Systems   ?Review of Systems   ?Constitutional:  Positive for fatigue.  ?Respiratory:  Positive for shortness of breath.   ?Cardiovascular:  Positive for chest pain.  ?Gastrointestinal:  Negative for abdominal pain.  ?Genitourinary:  Negative for enuresis.  ? ?Physical Exam ?Updated Vital Signs ?BP 98/65   Pulse 93   Temp 99.4 ?F (37.4 ?C) (Oral)   Resp 16   Ht 5\' 6"  (1.676 m)   Wt 82 kg   LMP 04/28/2021   SpO2 94%   BMI 29.18 kg/m?  ?Physical Exam ? ?ED Results / Procedures / Treatments   ?Labs ?(all labs ordered are listed, but only abnormal results are displayed) ?Labs Reviewed  ?TROPONIN I (HIGH SENSITIVITY)  ? ? ?EKG ?EKG Interpretation ? ?Date/Time:  Monday May 13 2021 08:18:32 EST ?Ventricular Rate:  113 ?PR Interval:  174 ?QRS Duration: 68 ?QT Interval:  318 ?QTC Calculation: 436 ?R Axis:   91 ?Text Interpretation: Sinus tachycardia Right atrial enlargement Rightward axis Borderline ECG When compared with ECG of 12-May-2021 01:00, PREVIOUS ECG IS PRESENT Confirmed by 14-May-2021 [phone]) on 05/13/2021 8:41:50 AM ? ?Radiology ?No results found. ? ?Procedures ?Procedures  ? ? ?Medications Ordered in ED ?Medications  ?ketorolac (TORADOL) 30 MG/ML injection 15 mg (15 mg Intramuscular Given 05/13/21 0930)  ? ? ?ED Course/ Medical Decision Making/ A&P ?  ?                        ?  Medical Decision Making ?Risk ?Prescription drug management. ? ? ?Patient presents with shortness of breath and chest pain.  Recently seen for same.  Has had visit to [facility] ER which I reviewed.  CT angiography done at that time.  EKG reviewed today and independently interpreted showed some nonspecific PR changes troponin done and reviewed from yesterday and was negative.  Does not appear to have a clear pericarditis but will give anti-inflammatories.  Outpatient follow-up as needed.  Discharge home. ? ? ? ? ? ? ? ?Final Clinical Impression(s) / ED Diagnoses ?Final diagnoses:  ?Nonspecific chest pain  ? ? ?Rx / DC Orders ?ED Discharge Orders   ? ?       Ordered  ?  ibuprofen (ADVIL) 600 MG tablet  Every 6 hours PRN,   Status:  Discontinued       ? 05/13/21 1139  ?  ibuprofen (ADVIL) 600 MG tablet  Every 6 hours PRN       ? 05/13/21 1146  ? ?  ?  ? ?  ? ? ?  ?Benjiman Core, MD ?05/14/21 1244 ? ?

## 2021-05-23 ENCOUNTER — Encounter: Payer: Self-pay | Admitting: Nurse Practitioner

## 2021-05-23 ENCOUNTER — Other Ambulatory Visit: Payer: Self-pay

## 2021-05-23 ENCOUNTER — Ambulatory Visit: Payer: Medicaid Other | Admitting: Nurse Practitioner

## 2021-05-23 VITALS — BP 111/80 | HR 98 | Temp 98.3°F | Resp 18 | Ht 66.0 in | Wt 184.0 lb

## 2021-05-23 DIAGNOSIS — R079 Chest pain, unspecified: Secondary | ICD-10-CM | POA: Diagnosis not present

## 2021-05-23 DIAGNOSIS — R051 Acute cough: Secondary | ICD-10-CM | POA: Diagnosis not present

## 2021-05-23 MED ORDER — OMEPRAZOLE 20 MG PO CPDR: 20.0000 mg | DELAYED_RELEASE_CAPSULE | Freq: Every day | ORAL | 0 refills | Status: DC

## 2021-05-23 MED ORDER — PREDNISONE 20 MG PO TABS: 20.0000 mg | ORAL_TABLET | Freq: Every day | ORAL | 0 refills | Status: AC

## 2021-05-23 NOTE — Progress Notes (Signed)
Patient has not taken medication today. ?Patient has eaten today. ?Patient reports intermittent chest pain has improved. Patient is now able to sleep on her side. Patient reports scratchy throat which causes her to cough. ?

## 2021-05-23 NOTE — Patient Instructions (Addendum)
1. Acute cough ? ?- predniSONE (DELTASONE) 20 MG tablet; Take 1 tablet (20 mg total) by mouth daily with breakfast for 5 days.  Dispense: 5 tablet; Refill: 0 ?- omeprazole (PRILOSEC) 20 MG capsule; Take 1 capsule (20 mg total) by mouth daily for 14 days.  Dispense: 14 capsule; Refill:0 ? ?2. Intermittent chest pain ? ? ?Follow up: ? ?Follow up in 2-4 weeks - chest pain and physical - Dr. Andrey Campanile or Amy ? ? ? ?

## 2021-05-23 NOTE — Assessment & Plan Note (Signed)
-   predniSONE (DELTASONE) 20 MG tablet; Take 1 tablet (20 mg total) by mouth daily with breakfast for 5 days.  Dispense: 5 tablet; Refill: 0 ?- omeprazole (PRILOSEC) 20 MG capsule; Take 1 capsule (20 mg total) by mouth daily for 14 days.  Dispense: 14 capsule; Refill:0 ? ?2. Intermittent chest pain ? ? ?Follow up: ? ?Follow up in 2-4 weeks - chest pain and physical - Dr. Andrey Campanile or Amy ?

## 2021-05-23 NOTE — Progress Notes (Signed)
@Patient  ID: , female    DOB: 06-23-1990, 31 y.o.   MRN: [id number] ? ?Chief Complaint  ?Patient presents with  ? Hospitalization Follow-up  ?  Chest Pain  ? ? ?Referring provider: ?[facility] ? ?HPI ? ?Patient presents today for a ED follow-up.  She was seen in the ED a few days ago for intermittent chest pain.  Overall cardiac work-up was negative.  Patient did have CT a performed due to elevated D-dimer.  Negative for pulmonary embolism.  Patient states that her pain has improved but still persist.  She states that she does not have a cough and chest congestion times past 3 days.Denies f/c/s, n/v/d, hemoptysis, PND, chest pain or edema. ? ? ? ?No Known Allergies ? ? ?There is no immunization history on file for this patient. ? ?History reviewed. No pertinent past medical history. ? ?Tobacco History: ?Social History  ? ?Tobacco Use  ?Smoking Status Never  ?Smokeless Tobacco Never  ? ?Counseling given: Not Answered ? ? ?Outpatient Encounter Medications as of 05/23/2021  ?Medication Sig  ? ibuprofen (ADVIL) 600 MG tablet Take 1 tablet (600 mg total) by mouth every 6 (six) hours as needed.  ? predniSONE (DELTASONE) 20 MG tablet Take 1 tablet (20 mg total) by mouth daily with breakfast for 5 days.  ? omeprazole (PRILOSEC) 20 MG capsule Take 1 capsule (20 mg total) by mouth daily for 14 days.  ? sucralfate (CARAFATE) 1 GM/10ML suspension Take 10 mLs (1 g total) by mouth 4 (four) times daily -  with meals and at bedtime. (Patient not taking: Reported on 05/23/2021)  ? XULANE 150-35 MCG/24HR transdermal patch APPLY 1 PATCH TOPICALLY ONCE A WEEK (Patient not taking: Reported on 05/23/2021)  ? [DISCONTINUED] albuterol (PROVENTIL HFA;VENTOLIN HFA) 108 (90 Base) MCG/ACT inhaler Inhale 2 puffs into the lungs every 4 (four) hours as needed for shortness of breath (cough).  ? [DISCONTINUED] omeprazole (PRILOSEC) 20 MG capsule Take 1 capsule (20 mg total) by mouth daily for 14 days.  ? [DISCONTINUED]  phenazopyridine (PYRIDIUM) 200 MG tablet Take 1 tablet (200 mg total) by mouth 3 (three) times daily as needed for pain.  ? ?No facility-administered encounter medications on file as of 05/23/2021.  ? ? ? ?Review of Systems ? ?Review of Systems  ?Constitutional: Negative.   ?HENT: Negative.    ?Respiratory:  Positive for cough.   ?Cardiovascular:  Positive for chest pain.  ?Gastrointestinal: Negative.   ?Allergic/Immunologic: Negative.   ?Neurological: Negative.   ?Psychiatric/Behavioral: Negative.     ? ? ? ?Physical Exam ? ?BP 111/80 (BP Location: Right Arm, Patient Position: Sitting, Cuff Size: Normal)   Pulse 98   Temp 98.3 ?F (36.8 ?C) (Oral)   Resp 18   Ht 5\' 6"  (1.676 m)   Wt 184 lb (83.5 kg)   LMP 05/22/2021   SpO2 98%   BMI 29.70 kg/m?  ? ?Wt Readings from Last 5 Encounters:  ?05/23/21 184 lb (83.5 kg)  ?05/13/21 180 lb 12.4 oz (82 kg)  ?05/12/21 180 lb (81.6 kg)  ?01/13/21 188 lb (85.3 kg)  ?03/15/20 190 lb (86.2 kg)  ? ? ? ?Physical Exam ?Vitals and nursing note reviewed.  ?Constitutional:   ?   General: She is not in acute distress. ?   Appearance: She is well-developed.  ?Cardiovascular:  ?   Rate and Rhythm: Normal rate and regular rhythm.  ?Pulmonary:  ?   Effort: Pulmonary effort is normal.  ?   Breath sounds: Normal breath  sounds.  ?Neurological:  ?   Mental Status: She is alert and oriented to person, place, and time.  ? ? ? ?Lab Results: ? ?CBC ?   ?Component Value Date/Time  ? WBC 6.1 05/12/2021 0104  ? RBC 4.46 05/12/2021 0104  ? HGB 12.2 05/12/2021 0104  ? HCT 36.7 05/12/2021 0104  ? PLT 298 05/12/2021 0104  ? MCV 82.3 05/12/2021 0104  ? MCH 27.4 05/12/2021 0104  ? MCHC 33.2 05/12/2021 0104  ? RDW 13.7 05/12/2021 0104  ? LYMPHSABS 1.1 05/12/2021 0104  ? MONOABS 1.2 (H) 05/12/2021 0104  ? EOSABS 0.0 05/12/2021 0104  ? BASOSABS 0.0 05/12/2021 0104  ? ? ?BMET ?   ?Component Value Date/Time  ? NA 131 (L) 05/12/2021 0104  ? K 3.9 05/12/2021 0104  ? CL 97 (L) 05/12/2021 0104  ? CO2 23  05/12/2021 0104  ? GLUCOSE 126 (H) 05/12/2021 0104  ? BUN 11 05/12/2021 0104  ? CREATININE 0.91 05/12/2021 0104  ? CALCIUM 8.9 05/12/2021 0104  ? GFRNONAA >60 05/12/2021 0104  ? GFRAA >60 05/15/2019 1658  ? ? ?BNP ?No results found for: BNP ? ?ProBNP ?No results found for: PROBNP ? ?Imaging: ?DG Chest 2 View ? ?Result Date: 05/12/2021 ?CLINICAL DATA:  Chest pain and shortness of breath. EXAM: CHEST - 2 VIEW COMPARISON:  01/13/2021 FINDINGS: Low lung volumes.The cardiomediastinal contours are normal. The lungs are clear. Pulmonary vasculature is normal. No consolidation, pleural effusion, or pneumothorax. No acute osseous abnormalities are seen. IMPRESSION: Low lung volumes without acute chest finding. Electronically Signed   By: Narda RutherfordMelanie  Sanford M.D.   On: 05/12/2021 01:40  ? ?CT Angio Chest PE W and/or Wo Contrast ? ?Result Date: 05/12/2021 ?CLINICAL DATA:  Chest pain and shortness of breath for 2 days, initial encounter EXAM: CT ANGIOGRAPHY CHEST WITH CONTRAST TECHNIQUE: Multidetector CT imaging of the chest was performed using the standard protocol during bolus administration of intravenous contrast. Multiplanar CT image reconstructions and MIPs were obtained to evaluate the vascular anatomy. RADIATION DOSE REDUCTION: This exam was performed according to the departmental dose-optimization program which includes automated exposure control, adjustment of the mA and/or kV according to patient size and/or use of iterative reconstruction technique. CONTRAST:  100mL OMNIPAQUE IOHEXOL 350 MG/ML SOLN COMPARISON:  Chest x-ray from earlier in the same day. FINDINGS: Cardiovascular: Thoracic aorta and its branches are well visualize without aneurysmal dilatation or dissection. No cardiac enlargement is noted. No coronary calcifications are seen. The pulmonary artery shows a normal branching pattern. No intraluminal filling defect to suggest pulmonary embolism is seen. Mediastinum/Nodes: Thoracic inlet is within normal limits.  No sizable hilar or mediastinal adenopathy is noted. The esophagus as visualized is within normal limits. Symmetrical axillary lymph nodes are seen the majority of which have normal fatty hila. The symmetrical nature suggestive benign etiology likely reactive in nature. Lungs/Pleura: Lungs are well aerated bilaterally. No focal infiltrate or sizable effusion is seen. Upper Abdomen: Visualized upper abdomen appears within normal limits. Musculoskeletal: No acute bony abnormality is noted. Review of the MIP images confirms the above findings. IMPRESSION: No evidence of pulmonary emboli. No focal infiltrate is seen. Symmetrical axillary lymph nodes which demonstrate normal fatty hila. There E fatty hila and symmetrical nature suggest a more reactive etiology. Electronically Signed   By: Alcide CleverMark  Lukens M.D.   On: 05/12/2021 03:09   ? ? ?Assessment & Plan:  ? ?Acute cough ?- predniSONE (DELTASONE) 20 MG tablet; Take 1 tablet (20 mg total) by mouth daily with  breakfast for 5 days.  Dispense: 5 tablet; Refill: 0 ?- omeprazole (PRILOSEC) 20 MG capsule; Take 1 capsule (20 mg total) by mouth daily for 14 days.  Dispense: 14 capsule; Refill:0 ? ?2. Intermittent chest pain ? ? ?Follow up: ? ?Follow up in 2-4 weeks - chest pain and physical - Dr. Andrey Campanile or Amy ? ? ? ? ?Ivonne Andrew, NP ?05/23/2021 ? ?

## 2021-05-24 ENCOUNTER — Telehealth: Payer: Self-pay | Admitting: Family Medicine

## 2021-05-24 NOTE — Telephone Encounter (Signed)
Pt called in trying to get earlier appt than 05/18  for a physical, because was told that Dr Andrey Campanile wanted to see her earlier than May if possible. Please call back ?

## 2021-05-24 NOTE — Telephone Encounter (Signed)
LM on Pts. VM to call back to try to get sooner appt.

## 2021-05-28 ENCOUNTER — Encounter: Payer: Medicaid Other | Admitting: Nurse Practitioner

## 2021-05-28 ENCOUNTER — Encounter: Payer: Self-pay | Admitting: Nurse Practitioner

## 2021-05-28 ENCOUNTER — Ambulatory Visit (INDEPENDENT_AMBULATORY_CARE_PROVIDER_SITE_OTHER): Payer: Medicaid Other | Admitting: Nurse Practitioner

## 2021-05-28 ENCOUNTER — Other Ambulatory Visit: Payer: Self-pay

## 2021-05-28 VITALS — BP 108/74 | HR 101 | Resp 18 | Ht 66.0 in | Wt 185.0 lb

## 2021-05-28 DIAGNOSIS — Z0000 Encounter for general adult medical examination without abnormal findings: Secondary | ICD-10-CM

## 2021-05-28 NOTE — Progress Notes (Signed)
? ?@Patient  ID: Tiffany Herrera, female    DOB: 17-Oct-1990, 31 y.o.   MRN: [id number] ? ?Chief Complaint  ?Patient presents with  ? Annual Exam  ? ? ?Referring provider: ?Dorna Mai, MD ? ? ?HPI ? ?Patient presents today for annual physical.  She states that overall she has been doing well.  She is not currently on any prescription medications.  She does not have any significant health history.  She denies any current issues with her bowel or bladder.  She denies any recent chest pain.  She overall she is doing well.Denies f/c/s, n/v/d, hemoptysis, PND, chest pain or edema. ? ? ? ? ? ? ? ? ? ?No Known Allergies ? ? ?There is no immunization history on file for this patient. ? ?No past medical history on file. ? ?Tobacco History: ?Social History  ? ?Tobacco Use  ?Smoking Status Never  ?Smokeless Tobacco Never  ? ?Counseling given: Not Answered ? ? ?Outpatient Encounter Medications as of 05/28/2021  ?Medication Sig  ? XULANE 150-35 MCG/24HR transdermal patch   ? ibuprofen (ADVIL) 600 MG tablet Take 1 tablet (600 mg total) by mouth every 6 (six) hours as needed. (Patient not taking: Reported on 05/28/2021)  ? omeprazole (PRILOSEC) 20 MG capsule Take 1 capsule (20 mg total) by mouth daily for 14 days. (Patient not taking: Reported on 05/28/2021)  ? predniSONE (DELTASONE) 20 MG tablet Take 1 tablet (20 mg total) by mouth daily with breakfast for 5 days. (Patient not taking: Reported on 05/28/2021)  ? sucralfate (CARAFATE) 1 GM/10ML suspension Take 10 mLs (1 g total) by mouth 4 (four) times daily -  with meals and at bedtime. (Patient not taking: Reported on 05/23/2021)  ? [DISCONTINUED] albuterol (PROVENTIL HFA;VENTOLIN HFA) 108 (90 Base) MCG/ACT inhaler Inhale 2 puffs into the lungs every 4 (four) hours as needed for shortness of breath (cough).  ? ?No facility-administered encounter medications on file as of 05/28/2021.  ? ? ? ?Review of Systems ? ?Review of Systems  ?Constitutional: Negative.   ?HENT: Negative.     ?Cardiovascular: Negative.   ?Gastrointestinal: Negative.   ?Allergic/Immunologic: Negative.   ?Neurological: Negative.   ?Psychiatric/Behavioral: Negative.     ? ? ? ?Physical Exam ? ?BP 108/74 (BP Location: Right Arm, Patient Position: Sitting, Cuff Size: Normal)   Pulse (!) 101   Resp 18   Ht 5\' 6"  (1.676 m)   Wt 185 lb (83.9 kg)   LMP 05/22/2021   SpO2 96%   BMI 29.86 kg/m?  ? ?Wt Readings from Last 5 Encounters:  ?05/28/21 185 lb (83.9 kg)  ?05/23/21 184 lb (83.5 kg)  ?05/13/21 180 lb 12.4 oz (82 kg)  ?05/12/21 180 lb (81.6 kg)  ?01/13/21 188 lb (85.3 kg)  ? ? ? ?Physical Exam ?Vitals and nursing note reviewed.  ?Constitutional:   ?   General: She is not in acute distress. ?   Appearance: She is well-developed.  ?Cardiovascular:  ?   Rate and Rhythm: Normal rate and regular rhythm.  ?Pulmonary:  ?   Effort: Pulmonary effort is normal.  ?   Breath sounds: Normal breath sounds.  ?Neurological:  ?   Mental Status: She is alert and oriented to person, place, and time.  ? ? ? ? ? ?Assessment & Plan:  ? ?Annual physical exam ? ?Orders Placed This Encounter  ?Procedures  ? CBC  ? Comprehensive metabolic panel  ? TSH  ? Lipid Panel  ? ? ? ?Stay well hydrated ? ?Stay active ? ?  PAP every 5 years ? ?Heart healthy diet ? ?Follow up: ? ?Follow up in 1 year or sooner if needed with Dr. Redmond Pulling ? ?Patient Instructions  ?Physical Exam: ? ?Orders Placed This Encounter  ?Procedures  ? CBC  ? Comprehensive metabolic panel  ? TSH  ? Lipid Panel  ? ? ? ?Stay well hydrated ? ?Stay active ? ?PAP every 5 years ? ?Heart healthy diet ? ?Follow up: ? ?Follow up in 1 year or sooner if needed with Dr. Redmond Pulling ? ?Health Maintenance, Female ?Adopting a healthy lifestyle and getting preventive care are important in promoting health and wellness. Ask your health care provider about: ?The right schedule for you to have regular tests and exams. ?Things you can do on your own to prevent diseases and keep yourself healthy. ?What should I  know about diet, weight, and exercise? ?Eat a healthy diet ? ?Eat a diet that includes plenty of vegetables, fruits, low-fat dairy products, and lean protein. ?Do not eat a lot of foods that are high in solid fats, added sugars, or sodium. ?Maintain a healthy weight ?Body mass index (BMI) is used to identify weight problems. It estimates body fat based on height and weight. Your health care provider can help determine your BMI and help you achieve or maintain a healthy weight. ?Get regular exercise ?Get regular exercise. This is one of the most important things you can do for your health. Most adults should: ?Exercise for at least 150 minutes each week. The exercise should increase your heart rate and make you sweat (moderate-intensity exercise). ?Do strengthening exercises at least twice a week. This is in addition to the moderate-intensity exercise. ?Spend less time sitting. Even light physical activity can be beneficial. ?Watch cholesterol and blood lipids ?Have your blood tested for lipids and cholesterol at 32 years of age, then have this test every 5 years. ?Have your cholesterol levels checked more often if: ?Your lipid or cholesterol levels are high. ?You are older than 32 years of age. ?You are at high risk for heart disease. ?What should I know about cancer screening? ?Depending on your health history and family history, you may need to have cancer screening at various ages. This may include screening for: ?Breast cancer. ?Cervical cancer. ?Colorectal cancer. ?Skin cancer. ?Lung cancer. ?What should I know about heart disease, diabetes, and high blood pressure? ?Blood pressure and heart disease ?High blood pressure causes heart disease and increases the risk of stroke. This is more likely to develop in people who have high blood pressure readings or are overweight. ?Have your blood pressure checked: ?Every 3-5 years if you are 105-55 years of age. ?Every year if you are 28 years old or  older. ?Diabetes ?Have regular diabetes screenings. This checks your fasting blood sugar level. Have the screening done: ?Once every three years after age 74 if you are at a normal weight and have a low risk for diabetes. ?More often and at a younger age if you are overweight or have a high risk for diabetes. ?What should I know about preventing infection? ?Hepatitis B ?If you have a higher risk for hepatitis B, you should be screened for this virus. Talk with your health care provider to find out if you are at risk for hepatitis B infection. ?Hepatitis C ?Testing is recommended for: ?Everyone born from [birth year] through 1966. ?Anyone with known risk factors for hepatitis C. ?Sexually transmitted infections (STIs) ?Get screened for STIs, including gonorrhea and chlamydia, if: ?You are sexually active and  are younger than 32 years of age. ?You are older than 32 years of age and your health care provider tells you that you are at risk for this type of infection. ?Your sexual activity has changed since you were last screened, and you are at increased risk for chlamydia or gonorrhea. Ask your health care provider if you are at risk. ?Ask your health care provider about whether you are at high risk for HIV. Your health care provider may recommend a prescription medicine to help prevent HIV infection. If you choose to take medicine to prevent HIV, you should first get tested for HIV. You should then be tested every 3 months for as long as you are taking the medicine. ?Pregnancy ?If you are about to stop having your period (premenopausal) and you may become pregnant, seek counseling before you get pregnant. ?Take 400 to 800 micrograms (mcg) of folic acid every day if you become pregnant. ?Ask for birth control (contraception) if you want to prevent pregnancy. ?Osteoporosis and menopause ?Osteoporosis is a disease in which the bones lose minerals and strength with aging. This can result in bone fractures. If you are 38 years old  or older, or if you are at risk for osteoporosis and fractures, ask your health care provider if you should: ?Be screened for bone loss. ?Take a calcium or vitamin D supplement to lower your risk of fractures. ?Be given hormone replace

## 2021-05-28 NOTE — Assessment & Plan Note (Signed)
?  Orders Placed This Encounter  ?Procedures  ?? CBC  ?? Comprehensive metabolic panel  ?? TSH  ?? Lipid Panel  ? ? ? ?Stay well hydrated ? ?Stay active ? ?PAP every 5 years ? ?Heart healthy diet ? ?Follow up: ? ?Follow up in 1 year or sooner if needed with Dr. Andrey Campanile ?

## 2021-05-28 NOTE — Patient Instructions (Addendum)
Physical Exam: ? ?Orders Placed This Encounter  ?Procedures  ? CBC  ? Comprehensive metabolic panel  ? TSH  ? Lipid Panel  ? ? ? ?Stay well hydrated ? ?Stay active ? ?PAP every 5 years ? ?Heart healthy diet ? ?Follow up: ? ?Follow up in 1 year or sooner if needed with Dr. Andrey Herrera ? ?Health Maintenance, Female ?Adopting a healthy lifestyle and getting preventive care are important in promoting health and wellness. Ask your health care provider about: ?The right schedule for you to have regular tests and exams. ?Things you can do on your own to prevent diseases and keep yourself healthy. ?What should I know about diet, weight, and exercise? ?Eat a healthy diet ? ?Eat a diet that includes plenty of vegetables, fruits, low-fat dairy products, and lean protein. ?Do not eat a lot of foods that are high in solid fats, added sugars, or sodium. ?Maintain a healthy weight ?Body mass index (BMI) is used to identify weight problems. It estimates body fat based on height and weight. Your health care provider can help determine your BMI and help you achieve or maintain a healthy weight. ?Get regular exercise ?Get regular exercise. This is one of the most important things you can do for your health. Most adults should: ?Exercise for at least 150 minutes each week. The exercise should increase your heart rate and make you sweat (moderate-intensity exercise). ?Do strengthening exercises at least twice a week. This is in addition to the moderate-intensity exercise. ?Spend less time sitting. Even light physical activity can be beneficial. ?Watch cholesterol and blood lipids ?Have your blood tested for lipids and cholesterol at 32 years of age, then have this test every 5 years. ?Have your cholesterol levels checked more often if: ?Your lipid or cholesterol levels are high. ?You are older than 32 years of age. ?You are at high risk for heart disease. ?What should I know about cancer screening? ?Depending on your health history and  family history, you may need to have cancer screening at various ages. This may include screening for: ?Breast cancer. ?Cervical cancer. ?Colorectal cancer. ?Skin cancer. ?Lung cancer. ?What should I know about heart disease, diabetes, and high blood pressure? ?Blood pressure and heart disease ?High blood pressure causes heart disease and increases the risk of stroke. This is more likely to develop in people who have high blood pressure readings or are overweight. ?Have your blood pressure checked: ?Every 3-5 years if you are 76-86 years of age. ?Every year if you are 59 years old or older. ?Diabetes ?Have regular diabetes screenings. This checks your fasting blood sugar level. Have the screening done: ?Once every three years after age 79 if you are at a normal weight and have a low risk for diabetes. ?More often and at a younger age if you are overweight or have a high risk for diabetes. ?What should I know about preventing infection? ?Hepatitis B ?If you have a higher risk for hepatitis B, you should be screened for this virus. Talk with your health care provider to find out if you are at risk for hepatitis B infection. ?Hepatitis C ?Testing is recommended for: ?Everyone born from [birth year] through 1966. ?Anyone with known risk factors for hepatitis C. ?Sexually transmitted infections (STIs) ?Get screened for STIs, including gonorrhea and chlamydia, if: ?You are sexually active and are younger than 32 years of age. ?You are older than 31 years of age and your health care provider tells you that you are at risk for this  type of infection. ?Your sexual activity has changed since you were last screened, and you are at increased risk for chlamydia or gonorrhea. Ask your health care provider if you are at risk. ?Ask your health care provider about whether you are at high risk for HIV. Your health care provider may recommend a prescription medicine to help prevent HIV infection. If you choose to take medicine to prevent HIV,  you should first get tested for HIV. You should then be tested every 3 months for as long as you are taking the medicine. ?Pregnancy ?If you are about to stop having your period (premenopausal) and you may become pregnant, seek counseling before you get pregnant. ?Take 400 to 800 micrograms (mcg) of folic acid every day if you become pregnant. ?Ask for birth control (contraception) if you want to prevent pregnancy. ?Osteoporosis and menopause ?Osteoporosis is a disease in which the bones lose minerals and strength with aging. This can result in bone fractures. If you are 7 years old or older, or if you are at risk for osteoporosis and fractures, ask your health care provider if you should: ?Be screened for bone loss. ?Take a calcium or vitamin D supplement to lower your risk of fractures. ?Be given hormone replacement therapy (HRT) to treat symptoms of menopause. ?Follow these instructions at home: ?Alcohol use ?Do not drink alcohol if: ?Your health care provider tells you not to drink. ?You are pregnant, may be pregnant, or are planning to become pregnant. ?If you drink alcohol: ?Limit how much you have to: ?0-1 drink a day. ?Know how much alcohol is in your drink. In the [place] one drink equals one 12 oz bottle of beer (355 mL), one 5 oz glass of wine (148 mL), or one 1? oz glass of hard liquor (44 mL). ?Lifestyle ?Do not use any products that contain nicotine or tobacco. These products include cigarettes, chewing tobacco, and vaping devices, such as e-cigarettes. If you need help quitting, ask your health care provider. ?Do not use street drugs. ?Do not share needles. ?Ask your health care provider for help if you need support or information about quitting drugs. ?General instructions ?Schedule regular health, dental, and eye exams. ?Stay current with your vaccines. ?Tell your health care provider if: ?You often feel depressed. ?You have ever been abused or do not feel safe at home. ?Summary ?Adopting a healthy  lifestyle and getting preventive care are important in promoting health and wellness. ?Follow your health care provider's instructions about healthy diet, exercising, and getting tested or screened for diseases. ?Follow your health care provider's instructions on monitoring your cholesterol and blood pressure. ?This information is not intended to replace advice given to you by your health care provider. Make sure you discuss any questions you have with your health care provider. ?Document Revised: 07/16/2020 Document Reviewed: 07/16/2020 ?[employer] Patient Education ? 2022 [employer] ? ?

## 2021-05-29 LAB — LIPID PANEL
Chol/HDL Ratio: 4.4 ratio (ref 0.0–4.4)
Cholesterol, Total: 124 mg/dL (ref 100–199)
HDL: 28 mg/dL — ABNORMAL LOW (ref >39)
LDL Chol Calc (NIH): 69 mg/dL (ref 0–99)
Triglycerides: 156 mg/dL — ABNORMAL HIGH (ref 0–149)
VLDL Cholesterol Cal: 27 mg/dL (ref 5–40)

## 2021-05-29 LAB — CBC
Hematocrit: 35.6 % (ref 34.0–46.6)
Hemoglobin: 12.5 g/dL (ref 11.1–15.9)
MCH: 28.9 pg (ref 26.6–33.0)
MCHC: 35.1 g/dL (ref 31.5–35.7)
MCV: 82 fL (ref 79–97)
Platelets: 346 10*3/uL (ref 150–450)
RBC: 4.32 x10E6/uL (ref 3.77–5.28)
RDW: 13.4 % (ref 11.7–15.4)
WBC: 3.8 10*3/uL (ref 3.4–10.8)

## 2021-05-29 LAB — COMPREHENSIVE METABOLIC PANEL
ALT: 18 IU/L (ref 0–32)
AST: 25 IU/L (ref 0–40)
Albumin/Globulin Ratio: 0.7 — ABNORMAL LOW (ref 1.2–2.2)
Albumin: 3.6 g/dL — ABNORMAL LOW (ref 3.8–4.8)
Alkaline Phosphatase: 61 IU/L (ref 44–121)
BUN/Creatinine Ratio: 13 (ref 9–23)
BUN: 10 mg/dL (ref 6–20)
Bilirubin Total: 0.2 mg/dL (ref 0.0–1.2)
CO2: 21 mmol/L (ref 20–29)
Calcium: 8.7 mg/dL (ref 8.7–10.2)
Chloride: 100 mmol/L (ref 96–106)
Creatinine, Ser: 0.75 mg/dL (ref 0.57–1.00)
Globulin, Total: 4.9 g/dL — ABNORMAL HIGH (ref 1.5–4.5)
Glucose: 88 mg/dL (ref 70–99)
Potassium: 4.3 mmol/L (ref 3.5–5.2)
Sodium: 136 mmol/L (ref 134–144)
Total Protein: 8.5 g/dL (ref 6.0–8.5)
eGFR: 109 mL/min/{1.73_m2} (ref >59)

## 2021-05-29 LAB — TSH: TSH: 0.874 u[IU]/mL (ref 0.450–4.500)

## 2021-07-25 ENCOUNTER — Encounter: Payer: Medicaid Other | Admitting: Family Medicine

## 2021-10-31 ENCOUNTER — Other Ambulatory Visit: Payer: Self-pay

## 2021-10-31 ENCOUNTER — Encounter (HOSPITAL_BASED_OUTPATIENT_CLINIC_OR_DEPARTMENT_OTHER): Payer: Self-pay | Admitting: Emergency Medicine

## 2021-10-31 ENCOUNTER — Inpatient Hospital Stay (HOSPITAL_BASED_OUTPATIENT_CLINIC_OR_DEPARTMENT_OTHER)
Admission: EM | Admit: 2021-10-31 | Discharge: 2021-11-04 | DRG: 315 | Disposition: A | Discharge status: Home / Self Care | Payer: Medicaid Other | Attending: Internal Medicine | Admitting: Internal Medicine

## 2021-10-31 ENCOUNTER — Encounter (HOSPITAL_COMMUNITY): Payer: Self-pay

## 2021-10-31 ENCOUNTER — Emergency Department (HOSPITAL_BASED_OUTPATIENT_CLINIC_OR_DEPARTMENT_OTHER): Payer: Medicaid Other

## 2021-10-31 DIAGNOSIS — R071 Chest pain on breathing: Secondary | ICD-10-CM | POA: Diagnosis present

## 2021-10-31 DIAGNOSIS — E871 Hypo-osmolality and hyponatremia: Secondary | ICD-10-CM | POA: Diagnosis present

## 2021-10-31 DIAGNOSIS — Z79899 Other long term (current) drug therapy: Secondary | ICD-10-CM

## 2021-10-31 DIAGNOSIS — Z8249 Family history of ischemic heart disease and other diseases of the circulatory system: Secondary | ICD-10-CM | POA: Diagnosis not present

## 2021-10-31 DIAGNOSIS — I301 Infective pericarditis: Secondary | ICD-10-CM

## 2021-10-31 DIAGNOSIS — I3139 Other pericardial effusion (noninflammatory): Secondary | ICD-10-CM | POA: Diagnosis not present

## 2021-10-31 DIAGNOSIS — I319 Disease of pericardium, unspecified: Secondary | ICD-10-CM | POA: Diagnosis present

## 2021-10-31 DIAGNOSIS — E861 Hypovolemia: Secondary | ICD-10-CM | POA: Diagnosis present

## 2021-10-31 DIAGNOSIS — R700 Elevated erythrocyte sedimentation rate: Secondary | ICD-10-CM | POA: Diagnosis not present

## 2021-10-31 DIAGNOSIS — I300 Acute nonspecific idiopathic pericarditis: Secondary | ICD-10-CM

## 2021-10-31 DIAGNOSIS — I514 Myocarditis, unspecified: Principal | ICD-10-CM

## 2021-10-31 DIAGNOSIS — Z833 Family history of diabetes mellitus: Secondary | ICD-10-CM | POA: Diagnosis not present

## 2021-10-31 DIAGNOSIS — R000 Tachycardia, unspecified: Secondary | ICD-10-CM | POA: Diagnosis present

## 2021-10-31 DIAGNOSIS — I309 Acute pericarditis, unspecified: Secondary | ICD-10-CM | POA: Diagnosis present

## 2021-10-31 DIAGNOSIS — Z20822 Contact with and (suspected) exposure to covid-19: Secondary | ICD-10-CM | POA: Diagnosis present

## 2021-10-31 DIAGNOSIS — R0682 Tachypnea, not elsewhere classified: Secondary | ICD-10-CM | POA: Diagnosis present

## 2021-10-31 DIAGNOSIS — R051 Acute cough: Secondary | ICD-10-CM

## 2021-10-31 LAB — RESPIRATORY PANEL BY PCR

## 2021-10-31 LAB — CBC WITH DIFFERENTIAL/PLATELET
Abs Immature Granulocytes: 0.05 10*3/uL (ref 0.00–0.07)
Basophils Absolute: 0 10*3/uL (ref 0.0–0.1)
Basophils Relative: 0 %
Eosinophils Absolute: 0 10*3/uL (ref 0.0–0.5)
Eosinophils Relative: 0 %
HCT: 33.4 % — ABNORMAL LOW (ref 36.0–46.0)
Hemoglobin: 11.2 g/dL — ABNORMAL LOW (ref 12.0–15.0)
Immature Granulocytes: 1 %
Lymphocytes Relative: 18 %
Lymphs Abs: 1.5 10*3/uL (ref 0.7–4.0)
MCH: 27.9 pg (ref 26.0–34.0)
MCHC: 33.5 g/dL (ref 30.0–36.0)
MCV: 83.3 fL (ref 80.0–100.0)
Monocytes Absolute: 0.6 10*3/uL (ref 0.1–1.0)
Monocytes Relative: 8 %
Neutro Abs: 6.1 10*3/uL (ref 1.7–7.7)
Neutrophils Relative %: 73 %
Platelets: 255 10*3/uL (ref 150–400)
RBC: 4.01 MIL/uL (ref 3.87–5.11)
RDW: 13.8 % (ref 11.5–15.5)
WBC: 8.3 10*3/uL (ref 4.0–10.5)
nRBC: 0 % (ref 0.0–0.2)

## 2021-10-31 LAB — URINALYSIS, MICROSCOPIC (REFLEX): RBC / HPF: NONE SEEN RBC/hpf (ref 0–5)

## 2021-10-31 LAB — CK TOTAL AND CKMB (NOT AT ARMC)
CK, MB: 2.5 ng/mL (ref 0.5–5.0)
Relative Index: 0.8 (ref 0.0–2.5)
Total CK: 314 U/L — ABNORMAL HIGH (ref 38–234)

## 2021-10-31 LAB — CK: Total CK: 314 U/L — ABNORMAL HIGH (ref 38–234)

## 2021-10-31 LAB — COMPREHENSIVE METABOLIC PANEL
ALT: 18 U/L (ref 0–44)
AST: 27 U/L (ref 15–41)
Albumin: 3 g/dL — ABNORMAL LOW (ref 3.5–5.0)
Alkaline Phosphatase: 37 U/L — ABNORMAL LOW (ref 38–126)
Anion gap: 7 (ref 5–15)
BUN: 8 mg/dL (ref 6–20)
CO2: 23 mmol/L (ref 22–32)
Calcium: 8.3 mg/dL — ABNORMAL LOW (ref 8.9–10.3)
Chloride: 99 mmol/L (ref 98–111)
Creatinine, Ser: 0.9 mg/dL (ref 0.44–1.00)
GFR, Estimated: >60 mL/min (ref >60)
Glucose, Bld: 121 mg/dL — ABNORMAL HIGH (ref 70–99)
Potassium: 3.7 mmol/L (ref 3.5–5.1)
Sodium: 129 mmol/L — ABNORMAL LOW (ref 135–145)
Total Bilirubin: 0.7 mg/dL (ref 0.3–1.2)
Total Protein: 8.6 g/dL — ABNORMAL HIGH (ref 6.5–8.1)

## 2021-10-31 LAB — RESP PANEL BY RT-PCR (FLU A&B, COVID) ARPGX2
Influenza A by PCR: NEGATIVE
Influenza B by PCR: NEGATIVE
SARS Coronavirus 2 by RT PCR: NEGATIVE

## 2021-10-31 LAB — URINALYSIS, ROUTINE W REFLEX MICROSCOPIC
Bilirubin Urine: NEGATIVE
Glucose, UA: NEGATIVE mg/dL
Hgb urine dipstick: NEGATIVE
Ketones, ur: NEGATIVE mg/dL
Nitrite: NEGATIVE
Protein, ur: NEGATIVE mg/dL
Specific Gravity, Urine: 1.01 (ref 1.005–1.030)
pH: 6 (ref 5.0–8.0)

## 2021-10-31 LAB — PREGNANCY, URINE: Preg Test, Ur: NEGATIVE

## 2021-10-31 LAB — TROPONIN I (HIGH SENSITIVITY)
Troponin I (High Sensitivity): 30 ng/L — ABNORMAL HIGH (ref <18)
Troponin I (High Sensitivity): 30 ng/L — ABNORMAL HIGH (ref <18)

## 2021-10-31 LAB — SEDIMENTATION RATE: Sed Rate: 85 mm/hr — ABNORMAL HIGH (ref 0–22)

## 2021-10-31 MED ORDER — ACETAMINOPHEN 325 MG PO TABS
650.0000 mg | ORAL_TABLET | Freq: Four times a day (QID) | ORAL | Status: DC | PRN
  Administered 2021-11-01 – 2021-11-03 (×3): 650 mg via ORAL
  Filled 2021-10-31 (×3): qty 2

## 2021-10-31 MED ORDER — SENNOSIDES-DOCUSATE SODIUM 8.6-50 MG PO TABS: 1.0000 | ORAL_TABLET | Freq: Every evening | ORAL | Status: DC | PRN

## 2021-10-31 MED ORDER — IOHEXOL 300 MG/ML  SOLN
80.0000 mL | Freq: Once | INTRAMUSCULAR | Status: AC | PRN
  Administered 2021-10-31: 80 mL via INTRAVENOUS

## 2021-10-31 MED ORDER — ACETAMINOPHEN 650 MG RE SUPP: 650.0000 mg | Freq: Four times a day (QID) | RECTAL | Status: DC | PRN

## 2021-10-31 MED ORDER — ONDANSETRON HCL 4 MG PO TABS: 4.0000 mg | ORAL_TABLET | Freq: Four times a day (QID) | ORAL | Status: DC | PRN

## 2021-10-31 MED ORDER — ALBUTEROL SULFATE HFA 108 (90 BASE) MCG/ACT IN AERS
2.0000 | INHALATION_SPRAY | Freq: Once | RESPIRATORY_TRACT | Status: AC
  Administered 2021-10-31: 2 via RESPIRATORY_TRACT
  Filled 2021-10-31: qty 6.7

## 2021-10-31 MED ORDER — ONDANSETRON HCL 4 MG/2ML IJ SOLN: 4.0000 mg | Freq: Four times a day (QID) | INTRAMUSCULAR | Status: DC | PRN

## 2021-10-31 MED ORDER — SODIUM CHLORIDE 0.9% FLUSH
3.0000 mL | Freq: Two times a day (BID) | INTRAVENOUS | Status: DC
Start: 2021-10-31 — End: 2021-11-04
  Administered 2021-10-31 – 2021-11-04 (×6): 3 mL via INTRAVENOUS

## 2021-10-31 MED ORDER — IBUPROFEN 200 MG PO TABS
600.0000 mg | ORAL_TABLET | Freq: Four times a day (QID) | ORAL | Status: DC
  Administered 2021-10-31 – 2021-11-01 (×4): 600 mg via ORAL
  Filled 2021-10-31: qty 1
  Filled 2021-10-31 (×2): qty 3

## 2021-10-31 MED ORDER — COLCHICINE 0.6 MG PO TABS
0.6000 mg | ORAL_TABLET | Freq: Two times a day (BID) | ORAL | Status: DC
  Administered 2021-10-31 – 2021-11-04 (×8): 0.6 mg via ORAL
  Filled 2021-10-31 (×9): qty 1

## 2021-10-31 MED ORDER — IBUPROFEN 400 MG PO TABS
600.0000 mg | ORAL_TABLET | Freq: Four times a day (QID) | ORAL | Status: DC | PRN
  Filled 2021-10-31: qty 1

## 2021-10-31 MED ORDER — ACETAMINOPHEN 325 MG PO TABS
650.0000 mg | ORAL_TABLET | Freq: Once | ORAL | Status: AC | PRN
  Administered 2021-10-31: 650 mg via ORAL
  Filled 2021-10-31: qty 2

## 2021-10-31 MED ORDER — SODIUM CHLORIDE 0.9 % IV BOLUS
1000.0000 mL | Freq: Once | INTRAVENOUS | Status: AC
  Administered 2021-10-31: 1000 mL via INTRAVENOUS

## 2021-10-31 MED ORDER — SODIUM CHLORIDE 0.9 % IV SOLN: INTRAVENOUS | Status: AC

## 2021-10-31 NOTE — H&P (Addendum)
History and Physical    Tiffany Herrera BPZ:[id number] DOB: 1990-04-20 DOA: 10/31/2021  PCP: Georganna Skeans, MD   Patient coming from: Home   Chief Complaint: Chest pain   HPI: Tiffany Herrera is a pleasant 31 y.o. female who denies any significant past medical history now presents to the emergency department for evaluation of chest pain.  Patient reports that she developed fevers, sore throat, general aches and malaise, and loss of appetite 4 days ago.  The initial symptoms improved significantly over the past day but then she developed new chest pain that prompted the ED visit today.  Pain is sharp, localized to the central chest, worse when laying back, and better when sitting up or leaning forward.  Her nonproductive cough has improved and she denies shortness of breath.  She denies leg swelling or tenderness.  She denies lightheadedness.  [facility] ED Course: Upon arrival to the ED, patient is found to be febrile to 39.5 C and tachycardic with systolic blood pressure of 93 and greater.  EKG features sinus tachycardia and chest x-ray demonstrates interval increase in size of cardiac silhouette.  CTA chest is negative for PE but notable for a new small simple pericardial effusion.  Troponin was 30, and then 30 again more than 2 hours later.    Cardiology (Dr. Izora Ribas) was consulted by the ED physician and recommended medical admission, echocardiogram, and treatment with colchicine and ibuprofen.  Review of Systems:  All other systems reviewed and apart from HPI, are negative.  History reviewed. No pertinent past medical history.  History reviewed. No pertinent surgical history.  Social History:   reports that she has never smoked. She has never used smokeless tobacco. She reports that she does not drink alcohol and does not use drugs.  No Known Allergies  Family History  Problem Relation Age of Onset   Healthy Mother    Healthy Father    Diabetes Maternal Grandmother     Diabetes Maternal Aunt      Prior to Admission medications   Medication Sig Start Date End Date Taking? Authorizing Provider  omeprazole (PRILOSEC) 20 MG capsule Take 1 capsule (20 mg total) by mouth daily for 14 days. Patient not taking: Reported on 05/28/2021 05/23/21 06/06/21  Ivonne Andrew, NP  albuterol (PROVENTIL HFA;VENTOLIN HFA) 108 (90 Base) MCG/ACT inhaler Inhale 2 puffs into the lungs every 4 (four) hours as needed for shortness of breath (cough). 05/14/18 09/21/18  Azalia Bilis, MD    Physical Exam: Vitals:   10/31/21 1904 10/31/21 2133 10/31/21 2227 10/31/21 2339  BP: 102/78 94/68 98/76  94/68  Pulse: (!) 104 88 88 85  Resp:  (!) 21 (!) 23 18  Temp:  97.6 F (36.4 C) (!) 97.1 F (36.2 C) 97.8 F (36.6 C)  TempSrc:  Oral Oral   SpO2: 96% 98% 100% 100%  Weight:   79 kg   Height:   5\' 6"  (1.676 m)     Constitutional: NAD, calm  Eyes: PERTLA, lids and conjunctivae normal ENMT: Mucous membranes are moist. Posterior pharynx clear of any exudate or lesions.   Neck: supple, no masses  Respiratory: no wheezing, no crackles. No accessory muscle use.  Cardiovascular: S1 & S2 heard, regular rate and rhythm. No extremity edema.   Abdomen: No distension, no tenderness, soft. Bowel sounds active.  Musculoskeletal: no clubbing / cyanosis. No joint deformity upper and lower extremities.   Skin: no significant rashes, lesions, ulcers. Warm, dry, well-perfused. Neurologic: CN 2-12 grossly intact. Moving all extremities.  Alert and oriented.  Psychiatric: Pleasant. Cooperative.    Labs and Imaging on Admission: I have personally reviewed following labs and imaging studies  CBC: Recent Labs  Lab 10/31/21 1526  WBC 8.3  NEUTROABS 6.1  HGB 11.2*  HCT 33.4*  MCV 83.3  PLT 255   Basic Metabolic Panel: Recent Labs  Lab 10/31/21 1526  NA 129*  K 3.7  CL 99  CO2 23  GLUCOSE 121*  BUN 8  CREATININE 0.90  CALCIUM 8.3*   GFR: Estimated Creatinine Clearance: 96.1 mL/min  (by C-G formula based on SCr of 0.9 mg/dL). Liver Function Tests: Recent Labs  Lab 10/31/21 1526  AST 27  ALT 18  ALKPHOS 37*  BILITOT 0.7  PROT 8.6*  ALBUMIN 3.0*   No results for input(s): "LIPASE", "AMYLASE" in the last 168 hours. No results for input(s): "AMMONIA" in the last 168 hours. Coagulation Profile: No results for input(s): "INR", "PROTIME" in the last 168 hours. Cardiac Enzymes: Recent Labs  Lab 10/31/21 1526  CKTOTAL 314*  314*  CKMB 2.5   BNP (last 3 results) No results for input(s): "PROBNP" in the last 8760 hours. HbA1C: No results for input(s): "HGBA1C" in the last 72 hours. CBG: No results for input(s): "GLUCAP" in the last 168 hours. Lipid Profile: No results for input(s): "CHOL", "HDL", "LDLCALC", "TRIG", "CHOLHDL", "LDLDIRECT" in the last 72 hours. Thyroid Function Tests: No results for input(s): "TSH", "T4TOTAL", "FREET4", "T3FREE", "THYROIDAB" in the last 72 hours. Anemia Panel: No results for input(s): "VITAMINB12", "FOLATE", "FERRITIN", "TIBC", "IRON", "RETICCTPCT" in the last 72 hours. Urine analysis:    Component Value Date/Time   COLORURINE YELLOW 10/31/2021 1524   APPEARANCEUR HAZY (A) 10/31/2021 1524   LABSPEC 1.010 10/31/2021 1524   PHURINE 6.0 10/31/2021 1524   GLUCOSEU NEGATIVE 10/31/2021 1524   HGBUR NEGATIVE 10/31/2021 1524   BILIRUBINUR NEGATIVE 10/31/2021 1524   KETONESUR NEGATIVE 10/31/2021 1524   PROTEINUR NEGATIVE 10/31/2021 1524   UROBILINOGEN 1.0 07/22/2012 1646   NITRITE NEGATIVE 10/31/2021 1524   LEUKOCYTESUR TRACE (A) 10/31/2021 1524   Sepsis Labs: @LABRCNTIP (procalcitonin:4,lacticidven:4) ) Recent Results (from the past 240 hour(s))  Resp Panel by RT-PCR (Flu A&B, Covid) Anterior Nasal Swab     Status: None   Collection Time: 10/31/21  2:55 PM   Specimen: Anterior Nasal Swab  Result Value Ref Range Status   SARS Coronavirus 2 by RT PCR NEGATIVE NEGATIVE Final    Comment: (NOTE) SARS-CoV-2 target nucleic acids  are NOT DETECTED.  The SARS-CoV-2 RNA is generally detectable in upper respiratory specimens during the acute phase of infection. The lowest concentration of SARS-CoV-2 viral copies this assay can detect is 138 copies/mL. A negative result does not preclude SARS-Cov-2 infection and should not be used as the sole basis for treatment or other patient management decisions. A negative result may occur with  improper specimen collection/handling, submission of specimen other than nasopharyngeal swab, presence of viral mutation(s) within the areas targeted by this assay, and inadequate number of viral copies(<138 copies/mL). A negative result must be combined with clinical observations, patient history, and epidemiological information. The expected result is Negative.  Fact Sheet for Patients:  11/02/21  Fact Sheet for Healthcare Providers:  BloggerCourse.com  This test is no t yet approved or cleared by the [employer] FDA and  has been authorized for detection and/or diagnosis of SARS-CoV-2 by FDA under an Emergency Use Authorization (EUA). This EUA will remain  in effect (meaning this test can be used) for the  duration of the COVID-19 declaration under Section 564(b)(1) of the Act, 21 U.S.C.section 360bbb-3(b)(1), unless the authorization is terminated  or revoked sooner.       Influenza A by PCR NEGATIVE NEGATIVE Final   Influenza B by PCR NEGATIVE NEGATIVE Final    Comment: (NOTE) The Xpert Xpress SARS-CoV-2/FLU/RSV plus assay is intended as an aid in the diagnosis of influenza from Nasopharyngeal swab specimens and should not be used as a sole basis for treatment. Nasal washings and aspirates are unacceptable for Xpert Xpress SARS-CoV-2/FLU/RSV testing.  Fact Sheet for Patients: BloggerCourse.com  Fact Sheet for Healthcare Providers: [employer]ious[employer]ker[employer]it  This test is  not yet approved or cleared by the [place] FDA and has been authorized for detection and/or diagnosis of SARS-CoV-2 by FDA under an Emergency Use Authorization (EUA). This EUA will remain in effect (meaning this test can be used) for the duration of the COVID-19 declaration under Section 564(b)(1) of the Act, 21 U.S.C. section 360bbb-3(b)(1), unless the authorization is terminated or revoked.  Performed at [facility], [address] [place], [place] [address]   Respiratory (~20 pathogens) panel by PCR     Status: None   Collection Time: 10/31/21  7:03 PM   Specimen: Nasopharyngeal Swab; Respiratory  Result Value Ref Range Status   Adenovirus NOT DETECTED NOT DETECTED Final   Coronavirus 229E NOT DETECTED NOT DETECTED Final    Comment: (NOTE) The Coronavirus on the Respiratory Panel, DOES NOT test for the novel  Coronavirus (2019 nCoV)    Coronavirus HKU1 NOT DETECTED NOT DETECTED Final   Coronavirus NL63 NOT DETECTED NOT DETECTED Final   Coronavirus OC43 NOT DETECTED NOT DETECTED Final   Metapneumovirus NOT DETECTED NOT DETECTED Final   Rhinovirus / Enterovirus NOT DETECTED NOT DETECTED Final   Influenza A NOT DETECTED NOT DETECTED Final   Influenza B NOT DETECTED NOT DETECTED Final   Parainfluenza Virus 1 NOT DETECTED NOT DETECTED Final   Parainfluenza Virus 2 NOT DETECTED NOT DETECTED Final   Parainfluenza Virus 3 NOT DETECTED NOT DETECTED Final   Parainfluenza Virus 4 NOT DETECTED NOT DETECTED Final   Respiratory Syncytial Virus NOT DETECTED NOT DETECTED Final   Bordetella pertussis NOT DETECTED NOT DETECTED Final   Bordetella Parapertussis NOT DETECTED NOT DETECTED Final   Chlamydophila pneumoniae NOT DETECTED NOT DETECTED Final   Mycoplasma pneumoniae NOT DETECTED NOT DETECTED Final    Comment: Performed at [facility], [address] [place], [place] [address]     Radiological Exams on Admission: CT Angio Chest PE W and/or Wo  Contrast  Result Date: 10/31/2021 CLINICAL DATA:  Headache, body aches, and sore throat for the past 5 days. EXAM: CT ANGIOGRAPHY CHEST WITH CONTRAST TECHNIQUE: Multidetector CT imaging of the chest was performed using the standard protocol during bolus administration of intravenous contrast. Multiplanar CT image reconstructions and MIPs were obtained to evaluate the vascular anatomy. RADIATION DOSE REDUCTION: This exam was performed according to the departmental dose-optimization program which includes automated exposure control, adjustment of the mA and/or kV according to patient size and/or use of iterative reconstruction technique. CONTRAST:  76mL OMNIPAQUE IOHEXOL 300 MG/ML  SOLN COMPARISON:  Chest x-ray from same day. CT chest dated May 12, 2021. FINDINGS: Cardiovascular: Satisfactory opacification of the pulmonary arteries to the segmental level. No evidence of pulmonary embolism. Normal heart size. New small simple pericardial effusion. Mediastinum/Nodes: Prominent bilateral axillary lymph nodes are unchanged and likely reactive. No enlarged mediastinal or hilar lymph nodes.  Thyroid gland, trachea, and esophagus demonstrate no significant findings. Lungs/Pleura: Minimal atelectasis at the lung bases. No focal consolidation, pleural effusion, or pneumothorax. Upper Abdomen: No acute abnormality. Musculoskeletal: No chest wall abnormality. No acute or significant osseous findings. Review of the MIP images confirms the above findings. IMPRESSION: 1. No evidence of pulmonary embolism. 2. New small simple pericardial effusion. Electronically Signed   By: Titus Dubin M.D.   On: 10/31/2021 16:54   DG Chest Port 1 View  Result Date: 10/31/2021 CLINICAL DATA:  Fever, cough, sore throat and headache for 5 days. EXAM: PORTABLE CHEST 1 VIEW COMPARISON:  Radiographs 05/12/2021 and 01/13/2021.  CT 05/12/2021. FINDINGS: 1513 hours. Interval increased heart size, more than expected solely on the basis of portable  AP technique. The pulmonary vascularity is normal. There is hypoventilation of both lung bases without confluent airspace opacity, significant pleural effusion or pneumothorax. The bones appear unremarkable. IMPRESSION: Interval increased size of the cardiac silhouette which could reflect cardiomegaly or a pericardial effusion. This may in part be technical given the AP technique and low lung volumes. Recommend PA and lateral views when the patient is able. Electronically Signed   By: Richardean Sale M.D.   On: 10/31/2021 15:28    EKG: Independently reviewed. Sinus tachycardia, rate 129, RAD.   Assessment/Plan   1. Pericarditis  - Presents with 4 days viral URI symptoms and 1 day of pleuritic chest pain that improves when sitting up or leaning forward   - CTA negative for PE but notable for small simple pericardial effusion  - Troponin slightly elevated and flat  - COVID and influenza pcr neg; 20-pathogen virus panel pending  - Cardiology was consulted by ED and recommended echo and treatment with colchicine and ibuprofen  - Continue colchicine and ibuprofen, follow-up echo findings    2. Hyponatremia  - Serum sodium is 129 on admission in setting of hypovolemia  - Given a liter of NS in ED  - Continue IVF hydration with NS and repeat chem panel in am    DVT prophylaxis: SCDs  Code Status: Full  Level of Care: Level of care: Progressive Family Communication: None present  Disposition Plan:  Patient is from: home  Anticipated d/c is to: Home  Anticipated d/c date is: 8/25 or 11/02/21  Patient currently: Pending cardiology consultation, echocardiogram  Consults called: cardiology  Admission status: Inpatient     Vianne Bulls, MD [facility]  10/31/2021, 11:57 PM

## 2021-10-31 NOTE — ED Provider Notes (Signed)
[facility] EMERGENCY DEPARTMENT Provider Note   CSN: [id number] Arrival date & time: 10/31/21  1438     History  Chief Complaint  Patient presents with   URI    Tiffany Herrera is a 31 y.o. female here presenting with cough and fever and muscle aches.  Patient works as a Animal nutritionist.  She states that she started having chills and subjective fever since yesterday.  Patient also has some chest pain as well.  She states that she felt tightness in her chest and has sore throat and has poor intake.  Denies any vomiting or abdominal pain or diarrhea.  Denies any urinary symptoms.  Denies any sick contacts with COVID but she works as a Animal nutritionist and is in contact with people all the time  The history is provided by the patient.       Home Medications Prior to Admission medications   Medication Sig Start Date End Date Taking? Authorizing Provider  ibuprofen (ADVIL) 600 MG tablet Take 1 tablet (600 mg total) by mouth every 6 (six) hours as needed. Patient not taking: Reported on 05/28/2021 05/13/21   Davonna Belling, MD  omeprazole (PRILOSEC) 20 MG capsule Take 1 capsule (20 mg total) by mouth daily for 14 days. Patient not taking: Reported on 05/28/2021 05/23/21 06/06/21  Fenton Foy, NP  sucralfate (CARAFATE) 1 GM/10ML suspension Take 10 mLs (1 g total) by mouth 4 (four) times daily -  with meals and at bedtime. Patient not taking: Reported on 05/23/2021 01/13/21   Nestor Lewandowsky  Marilu Favre 150-35 MCG/24HR transdermal patch  08/31/18   [provider]  albuterol (PROVENTIL HFA;VENTOLIN HFA) 108 (90 Base) MCG/ACT inhaler Inhale 2 puffs into the lungs every 4 (four) hours as needed for shortness of breath (cough). 05/14/18 09/21/18  Jola Schmidt, MD      Allergies    Patient has no known allergies.    Review of Systems   Review of Systems  Constitutional:  Positive for chills and fever.  Respiratory:  Positive for cough.   All other systems reviewed  and are negative.   Physical Exam Updated Vital Signs BP 115/71   Pulse (!) 115   Temp 98.8 F (37.1 C) (Oral)   Resp (!) 27   Ht 5' 6"  (1.676 m)   Wt 63 kg   LMP 10/28/2021 (Exact Date)   SpO2 97%   BMI 22.44 kg/m  Physical Exam Vitals and nursing note reviewed.  Constitutional:      Comments: Slightly dehydrated uncomfortable  HENT:     Head: Normocephalic.     Nose: Nose normal.     Mouth/Throat:     Mouth: Mucous membranes are dry.  Eyes:     Extraocular Movements: Extraocular movements intact.     Pupils: Pupils are equal, round, and reactive to light.  Cardiovascular:     Rate and Rhythm: Normal rate and regular rhythm.     Pulses: Normal pulses.     Heart sounds: Normal heart sounds.  Pulmonary:     Comments: Diminished bilateral bases.  No obvious wheezing or crackles Abdominal:     General: Abdomen is flat.     Palpations: Abdomen is soft.  Musculoskeletal:        General: Normal range of motion.     Cervical back: Normal range of motion and neck supple.  Skin:    General: Skin is warm.     Capillary Refill: Capillary refill takes less than 2  seconds.  Neurological:     General: No focal deficit present.     Mental Status: She is alert and oriented to person, place, and time.  Psychiatric:        Mood and Affect: Mood normal.        Behavior: Behavior normal.     ED Results / Procedures / Treatments   Labs (all labs ordered are listed, but only abnormal results are displayed) Labs Reviewed  CBC WITH DIFFERENTIAL/PLATELET - Abnormal; Notable for the following components:      Result Value   Hemoglobin 11.2 (*)    HCT 33.4 (*)    All other components within normal limits  COMPREHENSIVE METABOLIC PANEL - Abnormal; Notable for the following components:   Sodium 129 (*)    Glucose, Bld 121 (*)    Calcium 8.3 (*)    Total Protein 8.6 (*)    Albumin 3.0 (*)    Alkaline Phosphatase 37 (*)    All other components within normal limits  URINALYSIS,  ROUTINE W REFLEX MICROSCOPIC - Abnormal; Notable for the following components:   APPearance HAZY (*)    Leukocytes,Ua TRACE (*)    All other components within normal limits  CK - Abnormal; Notable for the following components:   Total CK 314 (*)    All other components within normal limits  SEDIMENTATION RATE - Abnormal; Notable for the following components:   Sed Rate 85 (*)    All other components within normal limits  URINALYSIS, MICROSCOPIC (REFLEX) - Abnormal; Notable for the following components:   Bacteria, UA RARE (*)    All other components within normal limits  TROPONIN I (HIGH SENSITIVITY) - Abnormal; Notable for the following components:   Troponin I (High Sensitivity) 30 (*)    All other components within normal limits  RESP PANEL BY RT-PCR (FLU A&B, COVID) ARPGX2  RESPIRATORY PANEL BY PCR  PREGNANCY, URINE  CK TOTAL AND CKMB (NOT AT [facility])  C-REACTIVE PROTEIN  TROPONIN I (HIGH SENSITIVITY)    EKG None  Radiology CT Angio Chest PE W and/or Wo Contrast  Result Date: 10/31/2021 CLINICAL DATA:  Headache, body aches, and sore throat for the past 5 days. EXAM: CT ANGIOGRAPHY CHEST WITH CONTRAST TECHNIQUE: Multidetector CT imaging of the chest was performed using the standard protocol during bolus administration of intravenous contrast. Multiplanar CT image reconstructions and MIPs were obtained to evaluate the vascular anatomy. RADIATION DOSE REDUCTION: This exam was performed according to the departmental dose-optimization program which includes automated exposure control, adjustment of the mA and/or kV according to patient size and/or use of iterative reconstruction technique. CONTRAST:  90m OMNIPAQUE IOHEXOL 300 MG/ML  SOLN COMPARISON:  Chest x-ray from same day. CT chest dated May 12, 2021. FINDINGS: Cardiovascular: Satisfactory opacification of the pulmonary arteries to the segmental level. No evidence of pulmonary embolism. Normal heart size. New small simple pericardial  effusion. Mediastinum/Nodes: Prominent bilateral axillary lymph nodes are unchanged and likely reactive. No enlarged mediastinal or hilar lymph nodes. Thyroid gland, trachea, and esophagus demonstrate no significant findings. Lungs/Pleura: Minimal atelectasis at the lung bases. No focal consolidation, pleural effusion, or pneumothorax. Upper Abdomen: No acute abnormality. Musculoskeletal: No chest wall abnormality. No acute or significant osseous findings. Review of the MIP images confirms the above findings. IMPRESSION: 1. No evidence of pulmonary embolism. 2. New small simple pericardial effusion. Electronically Signed   By: WTitus DubinM.D.   On: 10/31/2021 16:54   [facility]  Result  Date: 10/31/2021 CLINICAL DATA:  Fever, cough, sore throat and headache for 5 days. EXAM: PORTABLE CHEST 1 VIEW COMPARISON:  Radiographs 05/12/2021 and 01/13/2021.  CT 05/12/2021. FINDINGS: 1513 hours. Interval increased heart size, more than expected solely on the basis of portable AP technique. The pulmonary vascularity is normal. There is hypoventilation of both lung bases without confluent airspace opacity, significant pleural effusion or pneumothorax. The bones appear unremarkable. IMPRESSION: Interval increased size of the cardiac silhouette which could reflect cardiomegaly or a pericardial effusion. This may in part be technical given the AP technique and low lung volumes. Recommend PA and lateral views when the patient is able. Electronically Signed   By: Richardean Sale M.D.   On: 10/31/2021 15:28    Procedures Procedures      EMERGENCY DEPARTMENT [place] CARDIAC EXAM "Study: Limited Ultrasound of the Heart and Pericardium"  INDICATIONS:Chest pain Multiple views of the heart and pericardium were obtained in real-time with a multi-frequency probe.  PERFORMED HY:QMVHQI IMAGES ARCHIVED?: Yes LIMITATIONS:  Emergent procedure VIEWS USED: Subcostal 4 chamber, Parasternal long axis, Parasternal short  axis, and Apical 4 chamber  INTERPRETATION: Pericardial effusion present and no obvious tamponade    CRITICAL CARE Performed by: Wandra Arthurs   Total critical care time: 30 minutes  Critical care time was exclusive of separately billable procedures and treating other patients.  Critical care was necessary to treat or prevent imminent or life-threatening deterioration.  Critical care was time spent personally by me on the following activities: development of treatment plan with patient and/or surrogate as well as nursing, discussions with consultants, evaluation of patient's response to treatment, examination of patient, obtaining history from patient or surrogate, ordering and performing treatments and interventions, ordering and review of laboratory studies, ordering and review of radiographic studies, pulse oximetry and re-evaluation of patient's condition.   Medications Ordered in ED Medications  colchicine tablet 0.6 mg (has no administration in time range)  ibuprofen (ADVIL) tablet 600 mg (has no administration in time range)  acetaminophen (TYLENOL) tablet 650 mg (650 mg Oral Given 10/31/21 1453)  sodium chloride 0.9 % bolus 1,000 mL (0 mLs Intravenous Stopped 10/31/21 1626)  albuterol (VENTOLIN HFA) 108 (90 Base) MCG/ACT inhaler 2 puff (2 puffs Inhalation Given 10/31/21 1531)  iohexol (OMNIPAQUE) 300 MG/ML solution 80 mL (80 mLs Intravenous Contrast Given 10/31/21 1620)    ED Course/ Medical Decision Making/ A&P                           Medical Decision Making Caragh Gasper is a 31 y.o. female here presenting with fevers and chills.  Patient has been having fever since yesterday.  Patient is febrile 103 and is tachycardic.  Consider pneumonia versus bronchitis versus flu versus COVID versus pyelonephritis.  Plan to get CBC and CMP and UA and chest x-ray.  Will give Tylenol and hydrate and reassess.  5:17 PM I reviewed patient's labs and independently interpreted imaging  studies.  Her initial chest x-ray showed enlarged cardiac silhouette.  I performed bedside ultrasound and there is a moderate effusion there.  No obvious tamponade.  I added on inflammatory markers and her ESR is 85.  I also ordered a CT scan which showed simple pericardial effusion and no PE.  I am concerned for possible pericarditis versus myocarditis causing her fever and chest pain.  Her CK level was normal and CK-MB will be sent out to [facility].  I discussed case with cardiology (Dr.  Chandrasekhar).  He agreed with colchicine and also scheduled ibuprofen.  He agreed with inpatient admission for echo and possible cardiac MRI.  Cardiology will see patient as a consult and he requested medicine to admit.  Problems Addressed: Myocarditis, unspecified chronicity, unspecified myocarditis type [facility]): acute illness or injury Pericardial effusion: acute illness or injury  Amount and/or Complexity of Data Reviewed Labs: ordered. Decision-making details documented in ED Course. Radiology: ordered and independent interpretation performed. Decision-making details documented in ED Course. ECG/medicine tests: ordered and independent interpretation performed. Decision-making details documented in ED Course.  Risk OTC drugs. Prescription drug management. Decision regarding hospitalization.    Final Clinical Impression(s) / ED Diagnoses Final diagnoses:  None    Rx / DC Orders ED Discharge Orders     None         Drenda Freeze, MD 10/31/21 [phone]

## 2021-10-31 NOTE — ED Notes (Signed)
Called report to [facility] RN. Waiting for ETA from [facility].

## 2021-10-31 NOTE — ED Triage Notes (Signed)
Chest pain started today. Denies any sob, n/v/d

## 2021-10-31 NOTE — ED Notes (Signed)
Pt given chicken noodle soup snack and a drink with provider okay. Pt aware that she will be transferred to [facility] and is agreeable to the plan.

## 2021-10-31 NOTE — ED Triage Notes (Signed)
Body aches, sore throat, headache x 5 days.

## 2021-11-01 ENCOUNTER — Inpatient Hospital Stay (HOSPITAL_COMMUNITY): Payer: Medicaid Other

## 2021-11-01 ENCOUNTER — Encounter (HOSPITAL_COMMUNITY): Payer: Self-pay | Admitting: Family Medicine

## 2021-11-01 DIAGNOSIS — E871 Hypo-osmolality and hyponatremia: Secondary | ICD-10-CM | POA: Diagnosis not present

## 2021-11-01 DIAGNOSIS — I3139 Other pericardial effusion (noninflammatory): Secondary | ICD-10-CM | POA: Diagnosis not present

## 2021-11-01 DIAGNOSIS — I301 Infective pericarditis: Secondary | ICD-10-CM | POA: Diagnosis not present

## 2021-11-01 DIAGNOSIS — I300 Acute nonspecific idiopathic pericarditis: Secondary | ICD-10-CM | POA: Diagnosis not present

## 2021-11-01 LAB — COMPREHENSIVE METABOLIC PANEL
ALT: 18 U/L (ref 0–44)
AST: 22 U/L (ref 15–41)
Albumin: 2.6 g/dL — ABNORMAL LOW (ref 3.5–5.0)
Alkaline Phosphatase: 36 U/L — ABNORMAL LOW (ref 38–126)
Anion gap: 5 (ref 5–15)
BUN: 8 mg/dL (ref 6–20)
CO2: 24 mmol/L (ref 22–32)
Calcium: 8.2 mg/dL — ABNORMAL LOW (ref 8.9–10.3)
Chloride: 107 mmol/L (ref 98–111)
Creatinine, Ser: 0.86 mg/dL (ref 0.44–1.00)
GFR, Estimated: >60 mL/min (ref >60)
Glucose, Bld: 124 mg/dL — ABNORMAL HIGH (ref 70–99)
Potassium: 3.8 mmol/L (ref 3.5–5.1)
Sodium: 136 mmol/L (ref 135–145)
Total Bilirubin: 0.4 mg/dL (ref 0.3–1.2)
Total Protein: 7.9 g/dL (ref 6.5–8.1)

## 2021-11-01 LAB — CBC
HCT: 34.8 % — ABNORMAL LOW (ref 36.0–46.0)
Hemoglobin: 11.1 g/dL — ABNORMAL LOW (ref 12.0–15.0)
MCH: 27.4 pg (ref 26.0–34.0)
MCHC: 31.9 g/dL (ref 30.0–36.0)
MCV: 85.9 fL (ref 80.0–100.0)
Platelets: 252 10*3/uL (ref 150–400)
RBC: 4.05 MIL/uL (ref 3.87–5.11)
RDW: 13.9 % (ref 11.5–15.5)
WBC: 7.8 10*3/uL (ref 4.0–10.5)
nRBC: 0 % (ref 0.0–0.2)

## 2021-11-01 LAB — ECHOCARDIOGRAM COMPLETE
Area-P 1/2: 5.13 cm2
Height: 66 in
S' Lateral: 2.5 cm
Weight: 2779.56 oz

## 2021-11-01 LAB — MRSA NEXT GEN BY PCR, NASAL: MRSA by PCR Next Gen: NOT DETECTED

## 2021-11-01 LAB — SEDIMENTATION RATE: Sed Rate: 123 mm/hr — ABNORMAL HIGH (ref 0–22)

## 2021-11-01 LAB — C-REACTIVE PROTEIN: CRP: 24.6 mg/dL — ABNORMAL HIGH (ref <1.0)

## 2021-11-01 LAB — HIV ANTIBODY (ROUTINE TESTING W REFLEX): HIV Screen 4th Generation wRfx: NONREACTIVE

## 2021-11-01 MED ORDER — PREDNISONE 20 MG PO TABS
40.0000 mg | ORAL_TABLET | Freq: Every day | ORAL | Status: DC
  Administered 2021-11-01 – 2021-11-03 (×3): 40 mg via ORAL
  Filled 2021-11-01 (×3): qty 2

## 2021-11-01 NOTE — Plan of Care (Signed)
  Problem: Clinical Measurements: Goal: Will remain free from infection Outcome: Progressing Goal: Respiratory complications will improve Outcome: Progressing Goal: Cardiovascular complication will be avoided Outcome: Progressing   Problem: Activity: Goal: Risk for activity intolerance will decrease Outcome: Progressing   Problem: Nutrition: Goal: Adequate nutrition will be maintained Outcome: Progressing   Problem: Coping: Goal: Level of anxiety will decrease Outcome: Progressing   Problem: Pain Managment: Goal: General experience of comfort will improve Outcome: Progressing   Problem: Safety: Goal: Ability to remain free from injury will improve Outcome: Progressing

## 2021-11-01 NOTE — Progress Notes (Signed)
HOSPITAL MEDICINE OVERNIGHT EVENT NOTE    Nursing reports that patient has exhibited increasing shortness of breath this morning with intermittent mild hypoxia and tachypnea sometimes into the low 30s.  Nursing reports that lungs are seemingly clear.  Stat repeat chest x-ray obtained revealing persisting pericardial effusion without any new superimposed pulmonary edema.  Patient is hemodynamically stable no compelling clinical evidence of tamponade at this time.  We will continue 2 L of oxygen via nasal cannula at this time.  Continue to monitor closely.  Marinda Elk  MD [facility]

## 2021-11-01 NOTE — Consult Note (Signed)
Cardiology Consultation:   Patient ID: Shahira Fiske MRN: [id number]; DOB: [birth year]  Admit date: 10/31/2021 Date of Consult: 11/01/2021  PCP:  Dorna Mai, MD   [facility] Providers Cardiologist:  New to [facility]}     Patient Profile:   Luke Rigsbee is a 31 y.o. female with no significant past medical and surgical history who is being seen 11/01/2021 for the evaluation of pericarditis at the request of Dr. Louanne Belton.  History of Present Illness:   Ms. Manternach is a 31 year old female with no past medical history scented with chest pain.  She has no prior history of surgery.  She tried hookah briefly however quit since.  She never smoked tobacco.  Both of her parents are healthy, her aunt and her cousin has high blood pressure.  She denies any illicit drug use, she drinks socially.  She was in her usual state of health until Sunday.  She started having throat pain, myalgia, fever, and fatigue.  Symptoms been gradually improved over the next 4 days, however on Thursday morning, she woke up with a sharp chest pain.  Chest pain is worse with deep inspiration and palpation.  Chest pain is worse laying down and better with sitting up.  On arrival to the ED, she was febrile with temperature of 103.1.  She was also tachycardic and tachypneic.  Patient complained of shortness of breath and has not had any appetite for the past few days.  Blood pressure borderline.  Sodium was initially low at 129.  Albumin 3.0.  Total CK elevated at 314, CK-MB normal at 2.5.  High-sensitivity troponin borderline elevated at 30.  Hemoglobin 11.4.  Pregnancy test negative.  Sed rate elevated at 85.  CTA of the chest showed no evidence of PE, new small pericardial effusion.  Respiratory panel negative for COVID, influenza and the common respiratory viral illness.  HIV screen negative.  Chest x-ray showed persistent enlargement of cardiopericardial silhouette, trace left pleural effusion.  Echocardiogram  obtained on 11/01/2021 showed EF 65 to 70%, no regional wall motion abnormality, grade 1 DD, normal RV, small pericardial effusion with no evidence of cardiac tamponade, no significant valve issue.  Cardiology service consulted for possible pericarditis   History reviewed. No pertinent past medical history.  History reviewed. No pertinent surgical history.   Home Medications:  Prior to Admission medications   Medication Sig Start Date End Date Taking? Authorizing Provider  omeprazole (PRILOSEC) 20 MG capsule Take 1 capsule (20 mg total) by mouth daily for 14 days. Patient not taking: Reported on 05/28/2021 05/23/21 06/06/21  Fenton Foy, NP  albuterol (PROVENTIL HFA;VENTOLIN HFA) 108 (90 Base) MCG/ACT inhaler Inhale 2 puffs into the lungs every 4 (four) hours as needed for shortness of breath (cough). 05/14/18 09/21/18  Jola Schmidt, MD    Inpatient Medications: Scheduled Meds:  colchicine  0.6 mg Oral BID   ibuprofen  600 mg Oral QID   sodium chloride flush  3 mL Intravenous Q12H   Continuous Infusions:  PRN Meds: acetaminophen **OR** acetaminophen, ondansetron **OR** ondansetron (ZOFRAN) IV, senna-docusate  Allergies:   No Known Allergies  Social History:   Social History   Socioeconomic History   Marital status: Single    Spouse name: Not on file   Number of children: Not on file   Years of education: Not on file   Highest education level: Not on file  Occupational History   Not on file  Tobacco Use   Smoking status: Never   Smokeless tobacco:  Never  Vaping Use   Vaping Use: Never used  Substance and Sexual Activity   Alcohol use: No   Drug use: No   Sexual activity: Yes    Birth control/protection: None  Other Topics Concern   Not on file  Social History Narrative   Not on file   Social Determinants of Health   Financial Resource Strain: Not on file  Food Insecurity: Not on file  Transportation Needs: Not on file  Physical Activity: Not on file  Stress:  Not on file  Social Connections: Not on file  Intimate Partner Violence: Not on file    Family History:    Family History  Problem Relation Age of Onset   Healthy Mother    Healthy Father    Diabetes Maternal Grandmother    Hypertension Maternal Aunt    Diabetes Maternal Aunt    Hypertension Cousin      ROS:  Please see the history of present illness.   All other ROS reviewed and negative.     Physical Exam/Data:   Vitals:   11/01/21 0800 11/01/21 0900 11/01/21 1117 11/01/21 1207  BP: 101/64 106/78  112/81  Pulse: (!) 115 (!) 110 (!) 111   Resp: (!) 29 (!) 31 (!) 40   Temp:    98 F (36.7 C)  TempSrc:    Oral  SpO2: 98% 97% 95% 96%  Weight:      Height:        Intake/Output Summary (Last 24 hours) at 11/01/2021 1315 Last data filed at 11/01/2021 0700 Gross per 24 hour  Intake 1778.01 ml  Output --  Net 1778.01 ml      11/01/2021    3:59 AM 10/31/2021   10:27 PM 10/31/2021    2:47 PM  Last 3 Weights  Weight (lbs) 173 lb 11.6 oz 174 lb 2.6 oz 139 lb  Weight (kg) 78.8 kg 79 kg 63.05 kg     Body mass index is 28.04 kg/m.  General:  Well nourished, well developed, in no acute distress HEENT: normal Neck: no JVD Vascular: No carotid bruits; Distal pulses 2+ bilaterally Cardiac: Mildly tachycardic; no murmur  Lungs:  clear to auscultation bilaterally, no wheezing, rhonchi or rales  Abd: soft, nontender, no hepatomegaly  Ext: no edema Musculoskeletal:  No deformities, BUE and BLE strength normal and equal Skin: warm and dry  Neuro:  CNs 2-12 intact, no focal abnormalities noted Psych:  Normal affect   EKG:  The EKG was personally reviewed and demonstrates: Normal sinus rhythm, minimal T wave inversion in V3 and V4.  No significant ST-T wave changes Telemetry:  Telemetry was personally reviewed and demonstrates: Mild sinus tachycardia, no significant ventricular ectopy.  Relevant CV Studies:  Echo 11/01/2021  1. Left ventricular ejection fraction, by  estimation, is 65 to 70%. Left  ventricular ejection fraction by PLAX is 68 %. The left ventricle has  normal function. The left ventricle has no regional wall motion  abnormalities. Left ventricular diastolic  parameters are consistent with Grade I diastolic dysfunction (impaired  relaxation).   2. Right ventricular systolic function is normal. The right ventricular  size is normal.   3. A small pericardial effusion is present. The pericardial effusion is  circumferential. There is no evidence of cardiac tamponade.   4. The mitral valve is normal in structure. No evidence of mitral valve  regurgitation. No evidence of mitral stenosis.   5. The aortic valve is tricuspid. Aortic valve regurgitation is not  visualized. No aortic stenosis is present.   6. The inferior vena cava is normal in size with greater than 50%  respiratory variability, suggesting right atrial pressure of 3 mmHg.   Laboratory Data:  High Sensitivity Troponin:   Recent Labs  Lab 10/31/21 1525 10/31/21 1802  TROPONINIHS 30* 30*     Chemistry Recent Labs  Lab 10/31/21 1526 11/01/21 0537  NA 129* 136  K 3.7 3.8  CL 99 107  CO2 23 24  GLUCOSE 121* 124*  BUN 8 8  CREATININE 0.90 0.86  CALCIUM 8.3* 8.2*  GFRNONAA >60 >60  ANIONGAP 7 5    Recent Labs  Lab 10/31/21 1526 11/01/21 0537  PROT 8.6* 7.9  ALBUMIN 3.0* 2.6*  AST 27 22  ALT 18 18  ALKPHOS 37* 36*  BILITOT 0.7 0.4   Lipids No results for input(s): "CHOL", "TRIG", "HDL", "LABVLDL", "LDLCALC", "CHOLHDL" in the last 168 hours.  Hematology Recent Labs  Lab 10/31/21 1526 11/01/21 0537  WBC 8.3 7.8  RBC 4.01 4.05  HGB 11.2* 11.1*  HCT 33.4* 34.8*  MCV 83.3 85.9  MCH 27.9 27.4  MCHC 33.5 31.9  RDW 13.8 13.9  PLT 255 252   Thyroid No results for input(s): "TSH", "FREET4" in the last 168 hours.  BNPNo results for input(s): "BNP", "PROBNP" in the last 168 hours.  DDimer No results for input(s): "DDIMER" in the last 168  hours.   Radiology/Studies:  ECHOCARDIOGRAM COMPLETE  Result Date: 11/01/2021    ECHOCARDIOGRAM REPORT   Patient Name:   HONOUR SCHWIEGER Date of Exam: 11/01/2021 Medical Rec #:  [id number]        Height:       66.0 in Accession #:    [id number]       Weight:       173.7 lb Date of Birth:  09-03-1990       BSA:          1.884 m Patient Age:    30 years         BP:           101/64 mmHg Patient Gender: F                HR:           105 bpm. Exam Location:  Inpatient Procedure: 2D Echo, Cardiac Doppler and Color Doppler Indications:    Pericardial Effusion  History:        Patient has no prior history of Echocardiogram examinations.                 Pericarditis.  Sonographer:    Ronny Flurry Sonographer#2:  Johny Chess RDCS Referring Phys: [id number] [place]  1. Left ventricular ejection fraction, by estimation, is 65 to 70%. Left ventricular ejection fraction by PLAX is 68 %. The left ventricle has normal function. The left ventricle has no regional wall motion abnormalities. Left ventricular diastolic parameters are consistent with Grade I diastolic dysfunction (impaired relaxation).  2. Right ventricular systolic function is normal. The right ventricular size is normal.  3. A small pericardial effusion is present. The pericardial effusion is circumferential. There is no evidence of cardiac tamponade.  4. The mitral valve is normal in structure. No evidence of mitral valve regurgitation. No evidence of mitral stenosis.  5. The aortic valve is tricuspid. Aortic valve regurgitation is not visualized. No aortic stenosis is present.  6. The inferior vena cava is normal in size with greater than  50% respiratory variability, suggesting right atrial pressure of 3 mmHg. FINDINGS  Left Ventricle: Left ventricular ejection fraction, by estimation, is 65 to 70%. Left ventricular ejection fraction by PLAX is 68 %. The left ventricle has normal function. The left ventricle has no regional wall  motion abnormalities. The left ventricular internal cavity size was normal in size. There is no left ventricular hypertrophy. Left ventricular diastolic parameters are consistent with Grade I diastolic dysfunction (impaired relaxation). Right Ventricle: The right ventricular size is normal. No increase in right ventricular wall thickness. Right ventricular systolic function is normal. Left Atrium: Left atrial size was normal in size. Right Atrium: Right atrial size was normal in size. Pericardium: A small pericardial effusion is present. The pericardial effusion is circumferential. There is no evidence of cardiac tamponade. Mitral Valve: The mitral valve is normal in structure. No evidence of mitral valve regurgitation. No evidence of mitral valve stenosis. Tricuspid Valve: The tricuspid valve is normal in structure. Tricuspid valve regurgitation is not demonstrated. No evidence of tricuspid stenosis. Aortic Valve: The aortic valve is tricuspid. Aortic valve regurgitation is not visualized. No aortic stenosis is present. Pulmonic Valve: The pulmonic valve was normal in structure. Pulmonic valve regurgitation is not visualized. No evidence of pulmonic stenosis. Aorta: The aortic root is normal in size and structure. Venous: The inferior vena cava is normal in size with greater than 50% respiratory variability, suggesting right atrial pressure of 3 mmHg. IAS/Shunts: No atrial level shunt detected by color flow Doppler.  LEFT VENTRICLE PLAX 2D LV EF:         Left ventricular ejection fraction by PLAX is 68 %. LVIDd:         4.00 cm LVIDs:         2.50 cm LV PW:         1.20 cm LV IVS:        1.20 cm LVOT diam:     2.00 cm LV SV:         68 LV SV Index:   36 LVOT Area:     3.14 cm  RIGHT VENTRICLE RV S prime:     18.80 cm/s TAPSE (M-mode): 2.8 cm LEFT ATRIUM             Index        RIGHT ATRIUM          Index LA diam:        3.10 cm 1.65 cm/m   RA Area:     9.77 cm LA Vol (A2C):   36.4 ml 19.32 ml/m  RA Volume:    16.30 ml 8.65 ml/m LA Vol (A4C):   55.3 ml 29.36 ml/m LA Biplane Vol: 45.6 ml 24.21 ml/m  AORTIC VALVE LVOT Vmax:   141.00 cm/s LVOT Vmean:  93.433 cm/s LVOT VTI:    0.215 m  AORTA Ao Root diam: 3.10 cm Ao Asc diam:  3.30 cm MITRAL VALVE MV Area (PHT): 5.13 cm    SHUNTS MV Decel Time: 148 msec    Systemic VTI:  0.22 m MV E velocity: 92.00 cm/s  Systemic Diam: 2.00 cm MV A velocity: 84.00 cm/s MV E/A ratio:  1.10 Skeet Latch MD Electronically signed by Skeet Latch MD Signature Date/Time: 11/01/2021/12:31:51 PM    Final    DG Chest 1 View  Result Date: 11/01/2021 CLINICAL DATA:  31 year old female with history of increasing shortness of breath and chest pain. EXAM: CHEST  1 VIEW COMPARISON:  Chest x-ray 10/31/2021. FINDINGS: Lung  volumes are normal. No consolidative airspace disease. Trace left pleural effusion. No right pleural effusion. No pneumothorax. No evidence of pulmonary edema. Enlargement of the cardiopericardial silhouette. Upper mediastinal contours are within normal limits. IMPRESSION: 1. Persistent enlargement of the cardiopericardial silhouette, which could suggest cardiomegaly and/or presence of pericardial effusion. 2. Trace left pleural effusion. Electronically Signed   By: Vinnie Langton M.D.   On: 11/01/2021 06:23   CT Angio Chest PE W and/or Wo Contrast  Result Date: 10/31/2021 CLINICAL DATA:  Headache, body aches, and sore throat for the past 5 days. EXAM: CT ANGIOGRAPHY CHEST WITH CONTRAST TECHNIQUE: Multidetector CT imaging of the chest was performed using the standard protocol during bolus administration of intravenous contrast. Multiplanar CT image reconstructions and MIPs were obtained to evaluate the vascular anatomy. RADIATION DOSE REDUCTION: This exam was performed according to the departmental dose-optimization program which includes automated exposure control, adjustment of the mA and/or kV according to patient size and/or use of iterative reconstruction  technique. CONTRAST:  28m OMNIPAQUE IOHEXOL 300 MG/ML  SOLN COMPARISON:  Chest x-ray from same day. CT chest dated May 12, 2021. FINDINGS: Cardiovascular: Satisfactory opacification of the pulmonary arteries to the segmental level. No evidence of pulmonary embolism. Normal heart size. New small simple pericardial effusion. Mediastinum/Nodes: Prominent bilateral axillary lymph nodes are unchanged and likely reactive. No enlarged mediastinal or hilar lymph nodes. Thyroid gland, trachea, and esophagus demonstrate no significant findings. Lungs/Pleura: Minimal atelectasis at the lung bases. No focal consolidation, pleural effusion, or pneumothorax. Upper Abdomen: No acute abnormality. Musculoskeletal: No chest wall abnormality. No acute or significant osseous findings. Review of the MIP images confirms the above findings. IMPRESSION: 1. No evidence of pulmonary embolism. 2. New small simple pericardial effusion. Electronically Signed   By: WTitus DubinM.D.   On: 10/31/2021 16:54   DG Chest Port 1 View  Result Date: 10/31/2021 CLINICAL DATA:  Fever, cough, sore throat and headache for 5 days. EXAM: PORTABLE CHEST 1 VIEW COMPARISON:  Radiographs 05/12/2021 and 01/13/2021.  CT 05/12/2021. FINDINGS: 1513 hours. Interval increased heart size, more than expected solely on the basis of portable AP technique. The pulmonary vascularity is normal. There is hypoventilation of both lung bases without confluent airspace opacity, significant pleural effusion or pneumothorax. The bones appear unremarkable. IMPRESSION: Interval increased size of the cardiac silhouette which could reflect cardiomegaly or a pericardial effusion. This may in part be technical given the AP technique and low lung volumes. Recommend PA and lateral views when the patient is able. Electronically Signed   By: WRichardean SaleM.D.   On: 10/31/2021 15:28     Assessment and Plan:   Chest pain: Likely pericarditis or myopericarditis.  Symptom  preceded by viral infection started last Sunday.  Chest pain is worse laying down and better sitting up.  Chest pain is also pleuritic and worsened with palpation.  Echocardiogram showed normal EF with mild pericardial effusion.  CTA negative for PE.  ESR elevated.  Started on colchicine 0.6 mg twice a day and ibuprofen 600 mg every 6 hours.  Patient still complaining of 8 out of 10 chest pain.  Will discuss with MD to consider cardiac MRI.  Hyponatremia: Arrived with sodium of 129, improving.  Related to recent loss of appetite and poor oral intake  Sinus tachycardia: Physiological response related to #1.  Blood pressure borderline, will hold off on adding any rate control medication.   Risk Assessment/Risk Scores:     HEAR Score (for undifferentiated chest pain):  For questions or updates, please contact [place] Please consult [email or web address] for contact info under    Hilbert Corrigan, [place]  11/01/2021 1:15 PM

## 2021-11-01 NOTE — TOC Progression Note (Signed)
Transition of Care [facility]) - Progression Note    Patient Details  Name: Malky Rudzinski MRN: [id number] Date of Birth: Jan 04, 1991  Transition of Care [facility]) CM/SW Contact  Beckie Busing, RN Phone Number:[phone]  11/01/2021, 2:06 PM  Clinical Narrative:     Transition of Care (TOC) Screening Note   Patient Details  Name: Siah Kannan Date of Birth: 1991-01-16   Transition of Care [facility]) CM/SW Contact:    Beckie Busing, RN Phone Number: 11/01/2021, 2:06 PM    Transition of Care Department [facility]) has reviewed patient and no TOC needs have been identified at this time. We will continue to monitor patient advancement through interdisciplinary progression rounds.           Expected Discharge Plan and Services                                                 Social Determinants of Health (SDOH) Interventions    Readmission Risk Interventions     No data to display

## 2021-11-01 NOTE — Progress Notes (Signed)
PROGRESS NOTE    Tiffany Herrera  UXL:[id number] DOB: Oct 19, 1990 DOA: 10/31/2021 PCP: Dorna Mai, MD    Brief Narrative:   Tiffany Herrera is a 31 years old female with no significant past medical history presented to hospital with chest pain which was preceded by fever sore throat generalized aches and malaise with decreased appetite 4 days prior to presentation.  Patient complained of central chest pain sharp in nature more on lying back in taking a deep breath or coughing.  In the ED patient was noted to be febrile tachycardic with borderline low blood pressure.  EKG showed sinus tachycardia.  Chest x-ray showed increased cardiac silhouette.  CT of the chest was negative for PE but a small pericardial effusion.  Troponin was slightly elevated at 38-30.  Cardiology was consulted from the ED and patient was admitted to hospital for possibility of acute pericarditis.    Assessment and Plan: Principal Problem:   Pericarditis Active Problems:   Hyponatremia   Acute pericarditis  Likely viral with prodromal symptoms.  Still complains of sharp pleuritic chest pain.  On Motrin and colchicine at this time.  Cardiology has been consulted.  CTA was negative for PE but small simple pericardial effusion.  Troponin mildly elevated and flat.  COVID and influenza negative.  Respiratory viral panel was negative.  Follow cardiology recommendations.  Patient still very symptomatic and tachypneic due to splinting from sharp chest pain.  Check 2D echocardiogram-pending.  Fever but no leukocytosis.  ESR elevated at 85.  Urinalysis was negative for infection.  Pregnancy test was negative.  HIV is nonreactive.   Hyponatremia  - Serum sodium is 129 on admission.  Has improved with IV fluids.  Likely secondary to poor oral intake.    DVT prophylaxis: SCDs Start: 10/31/21 2252   Code Status:     Code Status: Full Code  Disposition: Home likely 1 to 2 days Status is: Inpatient Remains inpatient  appropriate because: Acute viral pericarditis, chest pain, pending clinical improvement, cardiology evaluation   Family Communication: Spoke with the patient's brother on the phone  Consultants:  Cardiology  Procedures:  None  Antimicrobials:  None  Anti-infectives (From admission, onward)    None      Subjective: Today, patient was seen and examined at bedside.  Complains of sharp chest pain especially on coughing taking a deep breath and worse on lying down.  Noted to have fever of 103.1 F with tachycardia.  Objective: Vitals:   11/01/21 0721 11/01/21 0800 11/01/21 0900 11/01/21 1117  BP: (!) 103/59 101/64 106/78   Pulse: (!) 117 (!) 115 (!) 110 (!) 111  Resp: (!) 29 (!) 29 (!) 31 (!) 40  Temp: 98.1 F (36.7 C)     TempSrc: Oral     SpO2: 99% 98% 97% 95%  Weight:      Height:        Intake/Output Summary (Last 24 hours) at 11/01/2021 1147 Last data filed at 11/01/2021 0700 Gross per 24 hour  Intake 1778.01 ml  Output --  Net 1778.01 ml   Filed Weights   10/31/21 1447 10/31/21 2227 11/01/21 0359  Weight: 63 kg 79 kg 78.8 kg    Physical Examination: Body mass index is 28.04 kg/m.  General:  Average built, not in obvious distress, in mild distress due to pain, on supplemental oxygen. HENT:   No scleral pallor or icterus noted. Oral mucosa is moist.  Chest:  Clear breath sounds.  Diminished breath sounds bilaterally. No crackles or  wheezes.  Tachypneic with splinting. CVS: S1 &S2 heard. No murmur.  Regular rate and rhythm.  Tachycardia noted Abdomen: Soft, nontender, nondistended.  Bowel sounds are heard.   Extremities: No cyanosis, clubbing or edema.  Peripheral pulses are palpable. Psych: Alert, awake and oriented, mildly anxious CNS:  No cranial nerve deficits.  Power equal in all extremities.   Skin: Warm and dry.  No rashes noted.  Data Reviewed:   CBC: Recent Labs  Lab 10/31/21 1526 11/01/21 0537  WBC 8.3 7.8  NEUTROABS 6.1  --   HGB 11.2*  11.1*  HCT 33.4* 34.8*  MCV 83.3 85.9  PLT 255 696    Basic Metabolic Panel: Recent Labs  Lab 10/31/21 1526 11/01/21 0537  NA 129* 136  K 3.7 3.8  CL 99 107  CO2 23 24  GLUCOSE 121* 124*  BUN 8 8  CREATININE 0.90 0.86  CALCIUM 8.3* 8.2*    Liver Function Tests: Recent Labs  Lab 10/31/21 1526 11/01/21 0537  AST 27 22  ALT 18 18  ALKPHOS 37* 36*  BILITOT 0.7 0.4  PROT 8.6* 7.9  ALBUMIN 3.0* 2.6*     Radiology Studies: DG Chest 1 View  Result Date: 11/01/2021 CLINICAL DATA:  31 year old female with history of increasing shortness of breath and chest pain. EXAM: CHEST  1 VIEW COMPARISON:  Chest x-ray 10/31/2021. FINDINGS: Lung volumes are normal. No consolidative airspace disease. Trace left pleural effusion. No right pleural effusion. No pneumothorax. No evidence of pulmonary edema. Enlargement of the cardiopericardial silhouette. Upper mediastinal contours are within normal limits. IMPRESSION: 1. Persistent enlargement of the cardiopericardial silhouette, which could suggest cardiomegaly and/or presence of pericardial effusion. 2. Trace left pleural effusion. Electronically Signed   By: Vinnie Langton M.D.   On: 11/01/2021 06:23   CT Angio Chest PE W and/or Wo Contrast  Result Date: 10/31/2021 CLINICAL DATA:  Headache, body aches, and sore throat for the past 5 days. EXAM: CT ANGIOGRAPHY CHEST WITH CONTRAST TECHNIQUE: Multidetector CT imaging of the chest was performed using the standard protocol during bolus administration of intravenous contrast. Multiplanar CT image reconstructions and MIPs were obtained to evaluate the vascular anatomy. RADIATION DOSE REDUCTION: This exam was performed according to the departmental dose-optimization program which includes automated exposure control, adjustment of the mA and/or kV according to patient size and/or use of iterative reconstruction technique. CONTRAST:  59m OMNIPAQUE IOHEXOL 300 MG/ML  SOLN COMPARISON:  Chest x-ray from same  day. CT chest dated May 12, 2021. FINDINGS: Cardiovascular: Satisfactory opacification of the pulmonary arteries to the segmental level. No evidence of pulmonary embolism. Normal heart size. New small simple pericardial effusion. Mediastinum/Nodes: Prominent bilateral axillary lymph nodes are unchanged and likely reactive. No enlarged mediastinal or hilar lymph nodes. Thyroid gland, trachea, and esophagus demonstrate no significant findings. Lungs/Pleura: Minimal atelectasis at the lung bases. No focal consolidation, pleural effusion, or pneumothorax. Upper Abdomen: No acute abnormality. Musculoskeletal: No chest wall abnormality. No acute or significant osseous findings. Review of the MIP images confirms the above findings. IMPRESSION: 1. No evidence of pulmonary embolism. 2. New small simple pericardial effusion. Electronically Signed   By: WTitus DubinM.D.   On: 10/31/2021 16:54   DG Chest Port 1 View  Result Date: 10/31/2021 CLINICAL DATA:  Fever, cough, sore throat and headache for 5 days. EXAM: PORTABLE CHEST 1 VIEW COMPARISON:  Radiographs 05/12/2021 and 01/13/2021.  CT 05/12/2021. FINDINGS: 1513 hours. Interval increased heart size, more than expected solely on the basis of portable  AP technique. The pulmonary vascularity is normal. There is hypoventilation of both lung bases without confluent airspace opacity, significant pleural effusion or pneumothorax. The bones appear unremarkable. IMPRESSION: Interval increased size of the cardiac silhouette which could reflect cardiomegaly or a pericardial effusion. This may in part be technical given the AP technique and low lung volumes. Recommend PA and lateral views when the patient is able. Electronically Signed   By: Richardean Sale M.D.   On: 10/31/2021 15:28      LOS: 1 day    Flora Lipps, MD [facility] Available via Epic secure chat 7am-7pm After these hours, please refer to coverage provider listed on [email or web address] 11/01/2021, 11:47  AM

## 2021-11-02 DIAGNOSIS — I3139 Other pericardial effusion (noninflammatory): Secondary | ICD-10-CM | POA: Diagnosis not present

## 2021-11-02 DIAGNOSIS — I300 Acute nonspecific idiopathic pericarditis: Secondary | ICD-10-CM | POA: Diagnosis not present

## 2021-11-02 DIAGNOSIS — R700 Elevated erythrocyte sedimentation rate: Secondary | ICD-10-CM

## 2021-11-02 DIAGNOSIS — E871 Hypo-osmolality and hyponatremia: Secondary | ICD-10-CM | POA: Diagnosis not present

## 2021-11-02 LAB — COMPREHENSIVE METABOLIC PANEL
ALT: 21 U/L (ref 0–44)
AST: 24 U/L (ref 15–41)
Albumin: 2.5 g/dL — ABNORMAL LOW (ref 3.5–5.0)
Alkaline Phosphatase: 43 U/L (ref 38–126)
Anion gap: 9 (ref 5–15)
BUN: 8 mg/dL (ref 6–20)
CO2: 20 mmol/L — ABNORMAL LOW (ref 22–32)
Calcium: 8.8 mg/dL — ABNORMAL LOW (ref 8.9–10.3)
Chloride: 105 mmol/L (ref 98–111)
Creatinine, Ser: 0.81 mg/dL (ref 0.44–1.00)
GFR, Estimated: >60 mL/min (ref >60)
Glucose, Bld: 171 mg/dL — ABNORMAL HIGH (ref 70–99)
Potassium: 4.1 mmol/L (ref 3.5–5.1)
Sodium: 134 mmol/L — ABNORMAL LOW (ref 135–145)
Total Bilirubin: 0.4 mg/dL (ref 0.3–1.2)
Total Protein: 7.9 g/dL (ref 6.5–8.1)

## 2021-11-02 LAB — ANTI-DNA ANTIBODY, DOUBLE-STRANDED: ds DNA Ab: 2 IU/mL (ref 0–9)

## 2021-11-02 LAB — CBC
HCT: 32.1 % — ABNORMAL LOW (ref 36.0–46.0)
Hemoglobin: 10.5 g/dL — ABNORMAL LOW (ref 12.0–15.0)
MCH: 27.4 pg (ref 26.0–34.0)
MCHC: 32.7 g/dL (ref 30.0–36.0)
MCV: 83.8 fL (ref 80.0–100.0)
Platelets: 253 10*3/uL (ref 150–400)
RBC: 3.83 MIL/uL — ABNORMAL LOW (ref 3.87–5.11)
RDW: 13.8 % (ref 11.5–15.5)
WBC: 10.2 10*3/uL (ref 4.0–10.5)
nRBC: 0 % (ref 0.0–0.2)

## 2021-11-02 MED ORDER — OXYCODONE-ACETAMINOPHEN 5-325 MG PO TABS
1.0000 | ORAL_TABLET | Freq: Four times a day (QID) | ORAL | Status: AC | PRN
  Administered 2021-11-02 (×3): 2 via ORAL
  Administered 2021-11-03 (×2): 1 via ORAL
  Filled 2021-11-02: qty 1
  Filled 2021-11-02 (×2): qty 2
  Filled 2021-11-02: qty 1
  Filled 2021-11-02: qty 2

## 2021-11-02 MED ORDER — OXYCODONE-ACETAMINOPHEN 5-325 MG PO TABS
1.0000 | ORAL_TABLET | Freq: Once | ORAL | Status: AC
  Administered 2021-11-02: 1 via ORAL
  Filled 2021-11-02: qty 1

## 2021-11-02 NOTE — Progress Notes (Signed)
PROGRESS NOTE    Tiffany Herrera  QHK:[id number] DOB: [birth year] DOA: 10/31/2021 PCP: Dorna Mai, MD    Brief Narrative:   Tiffany Herrera is a 31 years old female with no significant past medical history presented to hospital with chest pain which was preceded by fever sore throat generalized aches and malaise with decreased appetite 4 days prior to presentation.  Patient complained of central chest pain sharp in nature more on lying back in taking a deep breath or coughing.  In the ED patient was noted to be febrile tachycardic with borderline low blood pressure.  EKG showed sinus tachycardia.  Chest x-ray showed increased cardiac silhouette.  CT of the chest was negative for PE but a small pericardial effusion.  Troponin was slightly elevated at 38-30.  Cardiology was consulted from the ED and patient was admitted to hospital for possibility of acute pericarditis.    During hospitalization, patient continued to have severe pain.  Patient was initially on NSAID and colchicine which was changed to colchicine and prednisone.  Assessment and Plan: Principal Problem:   Pericarditis Active Problems:   Hyponatremia   Pericardial effusion   Acute pericarditis  Likely viral pericarditis with prodromal symptoms.  Still complains of pain and needed to progress.  For adequate control.  On prednisone and colchicine at this time.  Cardiology on board.  Communicated with cardiology about it.  We will continue to follow autoimmune work-up.  Hopefully just steroids would help  with inflammation and pain.  CTA was negative for PE but small simple pericardial effusion.  Troponin mildly elevated and flat.  COVID and influenza negative.  Respiratory viral panel was negative.  2D echocardiogram with LV ejection fraction of 65 to 70% with no regional wall motion abnormality, grade 1 diastolic dysfunction.  No fever or leukocytosis today.  ESR elevated at 85 on presentation.   Hyponatremia  - Serum sodium  is 129 on admission.  Has improved with IV fluids.  Latest sodium of 134.    DVT prophylaxis: SCDs Start: 10/31/21 2252   Code Status:     Code Status: Full Code  Disposition: Home likely 1 to 2 days when pain adequately controlled okay with cardiology..  Status is: Inpatient  Remains inpatient appropriate because: Acute viral pericarditis, ongoing chest pain, pending clinical improvement, follow cardiology recommendations    Family Communication:  Spoke with the patient's uncle on the phone on 08/02/2021  Consultants:  Cardiology  Procedures:  None  Antimicrobials:  None  Anti-infectives (From admission, onward)    None      Subjective: Today, patient was seen and examined at bedside.  Patient had severe pain overnight and required Percocet.  This feels little better than yesterday but he still has shortness of breath.  Objective: Vitals:   11/01/21 2351 11/02/21 0454 11/02/21 0610 11/02/21 0732  BP: 108/80 111/72  103/73  Pulse:  99  (!) 109  Resp: 16 16  (!) 25  Temp: 98.5 F (36.9 C) 98.1 F (36.7 C)  98.4 F (36.9 C)  TempSrc: Oral Oral  Oral  SpO2:  95% 96% 92%  Weight:  79.1 kg    Height:        Intake/Output Summary (Last 24 hours) at 11/02/2021 0753 Last data filed at 11/02/2021 0450 Gross per 24 hour  Intake 370 ml  Output 3 ml  Net 367 ml    Filed Weights   10/31/21 2227 11/01/21 0359 11/02/21 0454  Weight: 79 kg 78.8 kg 79.1 kg  Physical Examination: Body mass index is 28.15 kg/m.  General:  Average built, not in obvious distress, sitting up propped in bed. HENT:   No scleral pallor or icterus noted. Oral mucosa is moist.  Chest:  Clear breath sounds.  Diminished breath sounds bilaterally. No crackles or wheezes.  CVS: S1 &S2 heard. No murmur.  Regular rate and rhythm. Abdomen: Soft, nontender, nondistended.  Bowel sounds are heard.   Extremities: No cyanosis, clubbing or edema.  Peripheral pulses are palpable. Psych: Alert, awake  and oriented, mildly anxious, CNS:  No cranial nerve deficits.  Power equal in all extremities.   Skin: Warm and dry.  No rashes noted.  Data Reviewed:   CBC: Recent Labs  Lab 10/31/21 1526 11/01/21 0537 11/02/21 0052  WBC 8.3 7.8 10.2  NEUTROABS 6.1  --   --   HGB 11.2* 11.1* 10.5*  HCT 33.4* 34.8* 32.1*  MCV 83.3 85.9 83.8  PLT 255 252 253     Basic Metabolic Panel: Recent Labs  Lab 10/31/21 1526 11/01/21 0537 11/02/21 0052  NA 129* 136 134*  K 3.7 3.8 4.1  CL 99 107 105  CO2 23 24 20*  GLUCOSE 121* 124* 171*  BUN 8 8 8   CREATININE 0.90 0.86 0.81  CALCIUM 8.3* 8.2* 8.8*     Liver Function Tests: Recent Labs  Lab 10/31/21 1526 11/01/21 0537 11/02/21 0052  AST 27 22 24   ALT 18 18 21   ALKPHOS 37* 36* 43  BILITOT 0.7 0.4 0.4  PROT 8.6* 7.9 7.9  ALBUMIN 3.0* 2.6* 2.5*      Radiology Studies: ECHOCARDIOGRAM COMPLETE  Result Date: 11/01/2021    ECHOCARDIOGRAM REPORT   Patient Name:   Tiffany Herrera Date of Exam: 11/01/2021 Medical Rec #:  [id number]        Height:       66.0 in Accession #:    [id number]       Weight:       173.7 lb Date of Birth:  05-Nov-1990       BSA:          1.884 m Patient Age:    30 years         BP:           101/64 mmHg Patient Gender: F                HR:           105 bpm. Exam Location:  Inpatient Procedure: 2D Echo, Cardiac Doppler and Color Doppler Indications:    Pericardial Effusion  History:        Patient has no prior history of Echocardiogram examinations.                 Pericarditis.  Sonographer:    Ronny Flurry Sonographer#2:  Johny Chess RDCS Referring Phys: [id number] [place]  1. Left ventricular ejection fraction, by estimation, is 65 to 70%. Left ventricular ejection fraction by PLAX is 68 %. The left ventricle has normal function. The left ventricle has no regional wall motion abnormalities. Left ventricular diastolic parameters are consistent with Grade I diastolic dysfunction (impaired  relaxation).  2. Right ventricular systolic function is normal. The right ventricular size is normal.  3. A small pericardial effusion is present. The pericardial effusion is circumferential. There is no evidence of cardiac tamponade.  4. The mitral valve is normal in structure. No evidence of mitral valve regurgitation. No evidence of mitral stenosis.  5. The  aortic valve is tricuspid. Aortic valve regurgitation is not visualized. No aortic stenosis is present.  6. The inferior vena cava is normal in size with greater than 50% respiratory variability, suggesting right atrial pressure of 3 mmHg. FINDINGS  Left Ventricle: Left ventricular ejection fraction, by estimation, is 65 to 70%. Left ventricular ejection fraction by PLAX is 68 %. The left ventricle has normal function. The left ventricle has no regional wall motion abnormalities. The left ventricular internal cavity size was normal in size. There is no left ventricular hypertrophy. Left ventricular diastolic parameters are consistent with Grade I diastolic dysfunction (impaired relaxation). Right Ventricle: The right ventricular size is normal. No increase in right ventricular wall thickness. Right ventricular systolic function is normal. Left Atrium: Left atrial size was normal in size. Right Atrium: Right atrial size was normal in size. Pericardium: A small pericardial effusion is present. The pericardial effusion is circumferential. There is no evidence of cardiac tamponade. Mitral Valve: The mitral valve is normal in structure. No evidence of mitral valve regurgitation. No evidence of mitral valve stenosis. Tricuspid Valve: The tricuspid valve is normal in structure. Tricuspid valve regurgitation is not demonstrated. No evidence of tricuspid stenosis. Aortic Valve: The aortic valve is tricuspid. Aortic valve regurgitation is not visualized. No aortic stenosis is present. Pulmonic Valve: The pulmonic valve was normal in structure. Pulmonic valve  regurgitation is not visualized. No evidence of pulmonic stenosis. Aorta: The aortic root is normal in size and structure. Venous: The inferior vena cava is normal in size with greater than 50% respiratory variability, suggesting right atrial pressure of 3 mmHg. IAS/Shunts: No atrial level shunt detected by color flow Doppler.  LEFT VENTRICLE PLAX 2D LV EF:         Left ventricular ejection fraction by PLAX is 68 %. LVIDd:         4.00 cm LVIDs:         2.50 cm LV PW:         1.20 cm LV IVS:        1.20 cm LVOT diam:     2.00 cm LV SV:         68 LV SV Index:   36 LVOT Area:     3.14 cm  RIGHT VENTRICLE RV S prime:     18.80 cm/s TAPSE (M-mode): 2.8 cm LEFT ATRIUM             Index        RIGHT ATRIUM          Index LA diam:        3.10 cm 1.65 cm/m   RA Area:     9.77 cm LA Vol (A2C):   36.4 ml 19.32 ml/m  RA Volume:   16.30 ml 8.65 ml/m LA Vol (A4C):   55.3 ml 29.36 ml/m LA Biplane Vol: 45.6 ml 24.21 ml/m  AORTIC VALVE LVOT Vmax:   141.00 cm/s LVOT Vmean:  93.433 cm/s LVOT VTI:    0.215 m  AORTA Ao Root diam: 3.10 cm Ao Asc diam:  3.30 cm MITRAL VALVE MV Area (PHT): 5.13 cm    SHUNTS MV Decel Time: 148 msec    Systemic VTI:  0.22 m MV E velocity: 92.00 cm/s  Systemic Diam: 2.00 cm MV A velocity: 84.00 cm/s MV E/A ratio:  1.10 Skeet Latch MD Electronically signed by Skeet Latch MD Signature Date/Time: 11/01/2021/12:31:51 PM    Final    DG Chest 1 View  Result Date: 11/01/2021  CLINICAL DATA:  31 year old female with history of increasing shortness of breath and chest pain. EXAM: CHEST  1 VIEW COMPARISON:  Chest x-ray 10/31/2021. FINDINGS: Lung volumes are normal. No consolidative airspace disease. Trace left pleural effusion. No right pleural effusion. No pneumothorax. No evidence of pulmonary edema. Enlargement of the cardiopericardial silhouette. Upper mediastinal contours are within normal limits. IMPRESSION: 1. Persistent enlargement of the cardiopericardial silhouette, which could suggest  cardiomegaly and/or presence of pericardial effusion. 2. Trace left pleural effusion. Electronically Signed   By: Vinnie Langton M.D.   On: 11/01/2021 06:23   CT Angio Chest PE W and/or Wo Contrast  Result Date: 10/31/2021 CLINICAL DATA:  Headache, body aches, and sore throat for the past 5 days. EXAM: CT ANGIOGRAPHY CHEST WITH CONTRAST TECHNIQUE: Multidetector CT imaging of the chest was performed using the standard protocol during bolus administration of intravenous contrast. Multiplanar CT image reconstructions and MIPs were obtained to evaluate the vascular anatomy. RADIATION DOSE REDUCTION: This exam was performed according to the departmental dose-optimization program which includes automated exposure control, adjustment of the mA and/or kV according to patient size and/or use of iterative reconstruction technique. CONTRAST:  70m OMNIPAQUE IOHEXOL 300 MG/ML  SOLN COMPARISON:  Chest x-ray from same day. CT chest dated May 12, 2021. FINDINGS: Cardiovascular: Satisfactory opacification of the pulmonary arteries to the segmental level. No evidence of pulmonary embolism. Normal heart size. New small simple pericardial effusion. Mediastinum/Nodes: Prominent bilateral axillary lymph nodes are unchanged and likely reactive. No enlarged mediastinal or hilar lymph nodes. Thyroid gland, trachea, and esophagus demonstrate no significant findings. Lungs/Pleura: Minimal atelectasis at the lung bases. No focal consolidation, pleural effusion, or pneumothorax. Upper Abdomen: No acute abnormality. Musculoskeletal: No chest wall abnormality. No acute or significant osseous findings. Review of the MIP images confirms the above findings. IMPRESSION: 1. No evidence of pulmonary embolism. 2. New small simple pericardial effusion. Electronically Signed   By: WTitus DubinM.D.   On: 10/31/2021 16:54   DG Chest Port 1 View  Result Date: 10/31/2021 CLINICAL DATA:  Fever, cough, sore throat and headache for 5 days. EXAM:  PORTABLE CHEST 1 VIEW COMPARISON:  Radiographs 05/12/2021 and 01/13/2021.  CT 05/12/2021. FINDINGS: 1513 hours. Interval increased heart size, more than expected solely on the basis of portable AP technique. The pulmonary vascularity is normal. There is hypoventilation of both lung bases without confluent airspace opacity, significant pleural effusion or pneumothorax. The bones appear unremarkable. IMPRESSION: Interval increased size of the cardiac silhouette which could reflect cardiomegaly or a pericardial effusion. This may in part be technical given the AP technique and low lung volumes. Recommend PA and lateral views when the patient is able. Electronically Signed   By: WRichardean SaleM.D.   On: 10/31/2021 15:28      LOS: 2 days    LFlora Lipps MD [facility] Available via Epic secure chat 7am-7pm After these hours, please refer to coverage provider listed on [email or web address] 11/02/2021, 7:53 AM

## 2021-11-02 NOTE — Progress Notes (Signed)
DAILY PROGRESS NOTE   Patient Name: Tiffany Herrera Date of Encounter: 11/02/2021 Cardiologist: None  Chief Complaint   Somewhat better  Patient Profile   Tiffany Herrera is a 31 y.o. female with no significant past medical and surgical history who is being seen 11/01/2021 for the evaluation of pericarditis at the request of Dr. Tyson Babinski.  Subjective   Had chest pain and difficulty breathing this morning, improved with pain medicine. HR improved, respiratory rate has decreased and she appears more comfortable after starting steroids. Labs yesterday showed elevated CRP to 24.5, ESR 123 (higher) - started on prednisone, ibuprofen stopped. dsDNA negative, but remainder of autoimmune labs are pending, including ANA.  Objective   Vitals:   11/01/21 2351 11/02/21 0454 11/02/21 0610 11/02/21 0732  BP: 108/80 111/72  103/73  Pulse:  99  (!) 109  Resp: 16 16  (!) 25  Temp: 98.5 F (36.9 C) 98.1 F (36.7 C)  98.4 F (36.9 C)  TempSrc: Oral Oral  Oral  SpO2:  95% 96% 92%  Weight:  79.1 kg    Height:        Intake/Output Summary (Last 24 hours) at 11/02/2021 1022 Last data filed at 11/02/2021 0450 Gross per 24 hour  Intake 370 ml  Output 3 ml  Net 367 ml   Filed Weights   10/31/21 2227 11/01/21 0359 11/02/21 0454  Weight: 79 kg 78.8 kg 79.1 kg    Physical Exam   General appearance: alert, no distress, and sitting upright Neck: no carotid bruit, no JVD, and thyroid not enlarged, symmetric, no tenderness/mass/nodules Lungs: diminished breath sounds bibasilar Heart: regular rate and rhythm and no rub Abdomen: soft, non-tender; bowel sounds normal; no masses,  no organomegaly Extremities: extremities normal, atraumatic, no cyanosis or edema Pulses: 2+ and symmetric Skin: Skin color, texture, turgor normal. No rashes or lesions Neurologic: Grossly normal Psych: Pleasant  Inpatient Medications    Scheduled Meds:  colchicine  0.6 mg Oral BID   predniSONE  40 mg Oral Q  breakfast   sodium chloride flush  3 mL Intravenous Q12H    Continuous Infusions:   PRN Meds: acetaminophen **OR** acetaminophen, ondansetron **OR** ondansetron (ZOFRAN) IV, oxyCODONE-acetaminophen, senna-docusate   Labs   Results for orders placed or performed during the hospital encounter of 10/31/21 (from the past 48 hour(s))  Resp Panel by RT-PCR (Flu A&B, Covid) Anterior Nasal Swab     Status: None   Collection Time: 10/31/21  2:55 PM   Specimen: Anterior Nasal Swab  Result Value Ref Range   SARS Coronavirus 2 by RT PCR NEGATIVE NEGATIVE    Comment: (NOTE) SARS-CoV-2 target nucleic acids are NOT DETECTED.  The SARS-CoV-2 RNA is generally detectable in upper respiratory specimens during the acute phase of infection. The lowest concentration of SARS-CoV-2 viral copies this assay can detect is 138 copies/mL. A negative result does not preclude SARS-Cov-2 infection and should not be used as the sole basis for treatment or other patient management decisions. A negative result may occur with  improper specimen collection/handling, submission of specimen other than nasopharyngeal swab, presence of viral mutation(s) within the areas targeted by this assay, and inadequate number of viral copies(<138 copies/mL). A negative result must be combined with clinical observations, patient history, and epidemiological information. The expected result is Negative.  Fact Sheet for Patients:  BloggerCourse.com  Fact Sheet for Healthcare Providers:  SeriousBroker.it  This test is no t yet approved or cleared by the [place] FDA and  has  been authorized for detection and/or diagnosis of SARS-CoV-2 by FDA under an Emergency Use Authorization (EUA). This EUA will remain  in effect (meaning this test can be used) for the duration of the COVID-19 declaration under Section 564(b)(1) of the Act, 21 U.S.C.section 360bbb-3(b)(1), unless the  authorization is terminated  or revoked sooner.       Influenza A by PCR NEGATIVE NEGATIVE   Influenza B by PCR NEGATIVE NEGATIVE    Comment: (NOTE) The Xpert Xpress SARS-CoV-2/FLU/RSV plus assay is intended as an aid in the diagnosis of influenza from Nasopharyngeal swab specimens and should not be used as a sole basis for treatment. Nasal washings and aspirates are unacceptable for Xpert Xpress SARS-CoV-2/FLU/RSV testing.  Fact Sheet for Patients: EntrepreneurPulse.com.au  Fact Sheet for Healthcare Providers: IncredibleEmployment.be  This test is not yet approved or cleared by the [place] FDA and has been authorized for detection and/or diagnosis of SARS-CoV-2 by FDA under an Emergency Use Authorization (EUA). This EUA will remain in effect (meaning this test can be used) for the duration of the COVID-19 declaration under Section 564(b)(1) of the Act, 21 U.S.C. section 360bbb-3(b)(1), unless the authorization is terminated or revoked.  Performed at [facility], [address] [place], [place] [address]   Urinalysis, Routine w reflex microscopic Urine, Clean Catch     Status: Abnormal   Collection Time: 10/31/21  3:24 PM  Result Value Ref Range   Color, Urine YELLOW YELLOW   APPearance HAZY (A) CLEAR   Specific Gravity, Urine 1.010 1.005 - 1.030   pH 6.0 5.0 - 8.0   Glucose, UA NEGATIVE NEGATIVE mg/dL   Hgb urine dipstick NEGATIVE NEGATIVE   Bilirubin Urine NEGATIVE NEGATIVE   Ketones, ur NEGATIVE NEGATIVE mg/dL   Protein, ur NEGATIVE NEGATIVE mg/dL   Nitrite NEGATIVE NEGATIVE   Leukocytes,Ua TRACE (A) NEGATIVE    Comment: Performed at [facility], [address] [place], [place] [address]  Pregnancy, urine     Status: None   Collection Time: 10/31/21  3:24 PM  Result Value Ref Range   Preg Test, Ur NEGATIVE NEGATIVE    Comment:        THE SENSITIVITY OF THIS METHODOLOGY IS >20 mIU/mL. Performed  at [facility], [address] [place], [place] [address]   Urinalysis, Microscopic (reflex)     Status: Abnormal   Collection Time: 10/31/21  3:24 PM  Result Value Ref Range   RBC / HPF NONE SEEN 0 - 5 RBC/hpf   WBC, UA 0-5 0 - 5 WBC/hpf   Bacteria, UA RARE (A) NONE SEEN   Squamous Epithelial / LPF 6-10 0 - 5    Comment: Performed at [facility], [address] [place], [place] [address]  Troponin I (High Sensitivity)     Status: Abnormal   Collection Time: 10/31/21  3:25 PM  Result Value Ref Range   Troponin I (High Sensitivity) 30 (H) <18 ng/L    Comment: (NOTE) Elevated high sensitivity troponin I (hsTnI) values and significant  changes across serial measurements may suggest ACS but many other  chronic and acute conditions are known to elevate hsTnI results.  Refer to the "Links" section for chest pain algorithms and additional  guidance. Performed at [facility], [address], [place], [place] [address]   CBC with Differential     Status: Abnormal   Collection Time: 10/31/21  3:26 PM  Result  Value Ref Range   WBC 8.3 4.0 - 10.5 K/uL   RBC 4.01 3.87 - 5.11 MIL/uL   Hemoglobin 11.2 (L) 12.0 - 15.0 g/dL   HCT 33.4 (L) 36.0 - 46.0 %   MCV 83.3 80.0 - 100.0 fL   MCH 27.9 26.0 - 34.0 pg   MCHC 33.5 30.0 - 36.0 g/dL   RDW 13.8 11.5 - 15.5 %   Platelets 255 150 - 400 K/uL   nRBC 0.0 0.0 - 0.2 %   Neutrophils Relative % 73 %   Neutro Abs 6.1 1.7 - 7.7 K/uL   Lymphocytes Relative 18 %   Lymphs Abs 1.5 0.7 - 4.0 K/uL   Monocytes Relative 8 %   Monocytes Absolute 0.6 0.1 - 1.0 K/uL   Eosinophils Relative 0 %   Eosinophils Absolute 0.0 0.0 - 0.5 K/uL   Basophils Relative 0 %   Basophils Absolute 0.0 0.0 - 0.1 K/uL   Immature Granulocytes 1 %   Abs Immature Granulocytes 0.05 0.00 - 0.07 K/uL    Comment: Performed at [facility], [address] [place], [place] [address]  Comprehensive metabolic panel     Status: Abnormal    Collection Time: 10/31/21  3:26 PM  Result Value Ref Range   Sodium 129 (L) 135 - 145 mmol/L   Potassium 3.7 3.5 - 5.1 mmol/L   Chloride 99 98 - 111 mmol/L   CO2 23 22 - 32 mmol/L   Glucose, Bld 121 (H) 70 - 99 mg/dL    Comment: Glucose reference range applies only to samples taken after fasting for at least 8 hours.   BUN 8 6 - 20 mg/dL   Creatinine, Ser 0.90 0.44 - 1.00 mg/dL   Calcium 8.3 (L) 8.9 - 10.3 mg/dL   Total Protein 8.6 (H) 6.5 - 8.1 g/dL   Albumin 3.0 (L) 3.5 - 5.0 g/dL   AST 27 15 - 41 U/L   ALT 18 0 - 44 U/L   Alkaline Phosphatase 37 (L) 38 - 126 U/L   Total Bilirubin 0.7 0.3 - 1.2 mg/dL   GFR, Estimated >60 >60 mL/min    Comment: (NOTE) Calculated using the CKD-EPI Creatinine Equation (2021)    Anion gap 7 5 - 15    Comment: Performed at [facility], [address] [place], [place] [address]  CK     Status: Abnormal   Collection Time: 10/31/21  3:26 PM  Result Value Ref Range   Total CK 314 (H) 38 - 234 U/L    Comment: Performed at [facility], [address] [place], [place] [address]  CK total and CKMB (cardiac)not at [facility]     Status: Abnormal   Collection Time: 10/31/21  3:26 PM  Result Value Ref Range   Total CK 314 (H) 38 - 234 U/L   CK, MB 2.5 0.5 - 5.0 ng/mL   Relative Index 0.8 0.0 - 2.5    Comment: Performed at [facility], [place] [address] [place], [place] [address]  Sedimentation rate     Status: Abnormal   Collection Time: 10/31/21  3:26 PM  Result Value Ref Range   Sed Rate 85 (H) 0 - 22 mm/hr    Comment: Performed at [facility], [address] [place], [place] [address]  Troponin I (High Sensitivity)     Status: Abnormal   Collection Time: 10/31/21  6:02 PM  Result Value Ref  Range   Troponin I (High Sensitivity) 30 (H) <18 ng/L    Comment: (NOTE) Elevated high sensitivity troponin I (hsTnI) values and significant  changes across serial measurements may suggest ACS but many other  chronic and  acute conditions are known to elevate hsTnI results.  Refer to the "Links" section for chest pain algorithms and additional  guidance. Performed at [facility], [address] [place], [place] [address]   Respiratory (~20 pathogens) panel by PCR     Status: None   Collection Time: 10/31/21  7:03 PM   Specimen: Nasopharyngeal Swab; Respiratory  Result Value Ref Range   Adenovirus NOT DETECTED NOT DETECTED   Coronavirus 229E NOT DETECTED NOT DETECTED    Comment: (NOTE) The Coronavirus on the Respiratory Panel, DOES NOT test for the novel  Coronavirus (2019 nCoV)    Coronavirus HKU1 NOT DETECTED NOT DETECTED   Coronavirus NL63 NOT DETECTED NOT DETECTED   Coronavirus OC43 NOT DETECTED NOT DETECTED   Metapneumovirus NOT DETECTED NOT DETECTED   Rhinovirus / Enterovirus NOT DETECTED NOT DETECTED   Influenza A NOT DETECTED NOT DETECTED   Influenza B NOT DETECTED NOT DETECTED   Parainfluenza Virus 1 NOT DETECTED NOT DETECTED   Parainfluenza Virus 2 NOT DETECTED NOT DETECTED   Parainfluenza Virus 3 NOT DETECTED NOT DETECTED   Parainfluenza Virus 4 NOT DETECTED NOT DETECTED   Respiratory Syncytial Virus NOT DETECTED NOT DETECTED   Bordetella pertussis NOT DETECTED NOT DETECTED   Bordetella Parapertussis NOT DETECTED NOT DETECTED   Chlamydophila pneumoniae NOT DETECTED NOT DETECTED   Mycoplasma pneumoniae NOT DETECTED NOT DETECTED    Comment: Performed at [facility], [place] [address] [place], [place] [address]  MRSA Next Gen by PCR, Nasal     Status: None   Collection Time: 10/31/21 10:41 PM   Specimen: Nasal Mucosa; Nasal Swab  Result Value Ref Range   MRSA by PCR Next Gen NOT DETECTED NOT DETECTED    Comment: (NOTE) The GeneXpert MRSA Assay (FDA approved for NASAL specimens only), is one component of a comprehensive MRSA colonization surveillance program. It is not intended to diagnose MRSA infection nor to guide or monitor treatment for MRSA infections. Test  performance is not FDA approved in patients less than 52 years old. Performed at [facility], [place] [address], [place], [place] [address]   HIV Antibody (routine testing w rflx)     Status: None   Collection Time: 11/01/21  5:37 AM  Result Value Ref Range   HIV Screen 4th Generation wRfx Non Reactive Non Reactive    Comment: Performed at [facility], [place] [address] [place], [place] [address]  Comprehensive metabolic panel     Status: Abnormal   Collection Time: 11/01/21  5:37 AM  Result Value Ref Range   Sodium 136 135 - 145 mmol/L    Comment: DELTA CHECK NOTED   Potassium 3.8 3.5 - 5.1 mmol/L   Chloride 107 98 - 111 mmol/L   CO2 24 22 - 32 mmol/L   Glucose, Bld 124 (H) 70 - 99 mg/dL    Comment: Glucose reference range applies only to samples taken after fasting for at least 8 hours.   BUN 8 6 - 20 mg/dL   Creatinine, Ser 0.86 0.44 - 1.00 mg/dL   Calcium 8.2 (L) 8.9 - 10.3 mg/dL   Total Protein 7.9 6.5 - 8.1 g/dL   Albumin 2.6 (L) 3.5 - 5.0 g/dL  AST 22 15 - 41 U/L   ALT 18 0 - 44 U/L   Alkaline Phosphatase 36 (L) 38 - 126 U/L   Total Bilirubin 0.4 0.3 - 1.2 mg/dL   GFR, Estimated >60 >60 mL/min    Comment: (NOTE) Calculated using the CKD-EPI Creatinine Equation (2021)    Anion gap 5 5 - 15    Comment: Performed at [place] [address], [place], [place] [address]  CBC     Status: Abnormal   Collection Time: 11/01/21  5:37 AM  Result Value Ref Range   WBC 7.8 4.0 - 10.5 K/uL   RBC 4.05 3.87 - 5.11 MIL/uL   Hemoglobin 11.1 (L) 12.0 - 15.0 g/dL   HCT 34.8 (L) 36.0 - 46.0 %   MCV 85.9 80.0 - 100.0 fL   MCH 27.4 26.0 - 34.0 pg   MCHC 31.9 30.0 - 36.0 g/dL   RDW 13.9 11.5 - 15.5 %   Platelets 252 150 - 400 K/uL   nRBC 0.0 0.0 - 0.2 %    Comment: Performed at [facility], [place] [address] [place], [place] [address]  Anti-DNA antibody, double-stranded     Status: None   Collection Time: 11/01/21  4:05 PM  Result Value Ref Range   ds DNA Ab  2 0 - 9 IU/mL    Comment: (NOTE)                                   Negative      <5                                   Equivocal  5 - 9                                   Positive      >9 Performed At: [facility] [place], [place] [id number] Rush Farmer MD [id number]   Sedimentation rate     Status: Abnormal   Collection Time: 11/01/21  4:05 PM  Result Value Ref Range   Sed Rate 123 (H) 0 - 22 mm/hr    Comment: Performed at [facility], [place] [address] [place], [place] [address]  C-reactive protein     Status: Abnormal   Collection Time: 11/01/21  4:05 PM  Result Value Ref Range   CRP 24.6 (H) <1.0 mg/dL    Comment: Performed at [place] [address], [place], [place] [address]  Comprehensive metabolic panel     Status: Abnormal   Collection Time: 11/02/21 12:52 AM  Result Value Ref Range   Sodium 134 (L) 135 - 145 mmol/L   Potassium 4.1 3.5 - 5.1 mmol/L   Chloride 105 98 - 111 mmol/L   CO2 20 (L) 22 - 32 mmol/L   Glucose, Bld 171 (H) 70 - 99 mg/dL    Comment: Glucose reference range applies only to samples taken after fasting for at least 8 hours.   BUN 8 6 - 20 mg/dL   Creatinine, Ser 0.81 0.44 - 1.00 mg/dL   Calcium 8.8 (L) 8.9 - 10.3 mg/dL   Total Protein 7.9 6.5 - 8.1 g/dL   Albumin 2.5 (L) 3.5 - 5.0 g/dL  AST 24 15 - 41 U/L   ALT 21 0 - 44 U/L   Alkaline Phosphatase 43 38 - 126 U/L   Total Bilirubin 0.4 0.3 - 1.2 mg/dL   GFR, Estimated >60 >60 mL/min    Comment: (NOTE) Calculated using the CKD-EPI Creatinine Equation (2021)    Anion gap 9 5 - 15    Comment: Performed at [place] [address], [place], [place] [address]  CBC     Status: Abnormal   Collection Time: 11/02/21 12:52 AM  Result Value Ref Range   WBC 10.2 4.0 - 10.5 K/uL   RBC 3.83 (L) 3.87 - 5.11 MIL/uL   Hemoglobin 10.5 (L) 12.0 - 15.0 g/dL   HCT 32.1 (L) 36.0 - 46.0 %   MCV 83.8 80.0 - 100.0 fL   MCH 27.4 26.0 - 34.0 pg   MCHC 32.7  30.0 - 36.0 g/dL   RDW 13.8 11.5 - 15.5 %   Platelets 253 150 - 400 K/uL   nRBC 0.0 0.0 - 0.2 %    Comment: Performed at [facility], [place] [address] [place], [place] [address]    ECG   N/A  Telemetry   Sinus rhythm - Personally Reviewed  Radiology    ECHOCARDIOGRAM COMPLETE  Result Date: 11/01/2021    ECHOCARDIOGRAM REPORT   Patient Name:   Tiffany Herrera Date of Exam: 11/01/2021 Medical Rec #:  [id number]        Height:       66.0 in Accession #:    [id number]       Weight:       173.7 lb Date of Birth:  1990-08-18       BSA:          1.884 m Patient Age:    30 years         BP:           101/64 mmHg Patient Gender: F                HR:           105 bpm. Exam Location:  Inpatient Procedure: 2D Echo, Cardiac Doppler and Color Doppler Indications:    Pericardial Effusion  History:        Patient has no prior history of Echocardiogram examinations.                 Pericarditis.  Sonographer:    Ronny Flurry Sonographer#2:  Johny Chess RDCS Referring Phys: [id number] [place]  1. Left ventricular ejection fraction, by estimation, is 65 to 70%. Left ventricular ejection fraction by PLAX is 68 %. The left ventricle has normal function. The left ventricle has no regional wall motion abnormalities. Left ventricular diastolic parameters are consistent with Grade I diastolic dysfunction (impaired relaxation).  2. Right ventricular systolic function is normal. The right ventricular size is normal.  3. A small pericardial effusion is present. The pericardial effusion is circumferential. There is no evidence of cardiac tamponade.  4. The mitral valve is normal in structure. No evidence of mitral valve regurgitation. No evidence of mitral stenosis.  5. The aortic valve is tricuspid. Aortic valve regurgitation is not visualized. No aortic stenosis is present.  6. The inferior vena cava is normal in size with greater than 50% respiratory variability, suggesting right atrial  pressure of 3 mmHg. FINDINGS  Left Ventricle: Left ventricular ejection fraction, by estimation, is 65 to 70%. Left ventricular ejection fraction  by PLAX is 68 %. The left ventricle has normal function. The left ventricle has no regional wall motion abnormalities. The left ventricular internal cavity size was normal in size. There is no left ventricular hypertrophy. Left ventricular diastolic parameters are consistent with Grade I diastolic dysfunction (impaired relaxation). Right Ventricle: The right ventricular size is normal. No increase in right ventricular wall thickness. Right ventricular systolic function is normal. Left Atrium: Left atrial size was normal in size. Right Atrium: Right atrial size was normal in size. Pericardium: A small pericardial effusion is present. The pericardial effusion is circumferential. There is no evidence of cardiac tamponade. Mitral Valve: The mitral valve is normal in structure. No evidence of mitral valve regurgitation. No evidence of mitral valve stenosis. Tricuspid Valve: The tricuspid valve is normal in structure. Tricuspid valve regurgitation is not demonstrated. No evidence of tricuspid stenosis. Aortic Valve: The aortic valve is tricuspid. Aortic valve regurgitation is not visualized. No aortic stenosis is present. Pulmonic Valve: The pulmonic valve was normal in structure. Pulmonic valve regurgitation is not visualized. No evidence of pulmonic stenosis. Aorta: The aortic root is normal in size and structure. Venous: The inferior vena cava is normal in size with greater than 50% respiratory variability, suggesting right atrial pressure of 3 mmHg. IAS/Shunts: No atrial level shunt detected by color flow Doppler.  LEFT VENTRICLE PLAX 2D LV EF:         Left ventricular ejection fraction by PLAX is 68 %. LVIDd:         4.00 cm LVIDs:         2.50 cm LV PW:         1.20 cm LV IVS:        1.20 cm LVOT diam:     2.00 cm LV SV:         68 LV SV Index:   36 LVOT Area:     3.14 cm   RIGHT VENTRICLE RV S prime:     18.80 cm/s TAPSE (M-mode): 2.8 cm LEFT ATRIUM             Index        RIGHT ATRIUM          Index LA diam:        3.10 cm 1.65 cm/m   RA Area:     9.77 cm LA Vol (A2C):   36.4 ml 19.32 ml/m  RA Volume:   16.30 ml 8.65 ml/m LA Vol (A4C):   55.3 ml 29.36 ml/m LA Biplane Vol: 45.6 ml 24.21 ml/m  AORTIC VALVE LVOT Vmax:   141.00 cm/s LVOT Vmean:  93.433 cm/s LVOT VTI:    0.215 m  AORTA Ao Root diam: 3.10 cm Ao Asc diam:  3.30 cm MITRAL VALVE MV Area (PHT): 5.13 cm    SHUNTS MV Decel Time: 148 msec    Systemic VTI:  0.22 m MV E velocity: 92.00 cm/s  Systemic Diam: 2.00 cm MV A velocity: 84.00 cm/s MV E/A ratio:  1.10 Skeet Latch MD Electronically signed by Skeet Latch MD Signature Date/Time: 11/01/2021/12:31:51 PM    Final    DG Chest 1 View  Result Date: 11/01/2021 CLINICAL DATA:  31 year old female with history of increasing shortness of breath and chest pain. EXAM: CHEST  1 VIEW COMPARISON:  Chest x-ray 10/31/2021. FINDINGS: Lung volumes are normal. No consolidative airspace disease. Trace left pleural effusion. No right pleural effusion. No pneumothorax. No evidence of pulmonary edema. Enlargement of the cardiopericardial silhouette. Upper mediastinal  contours are within normal limits. IMPRESSION: 1. Persistent enlargement of the cardiopericardial silhouette, which could suggest cardiomegaly and/or presence of pericardial effusion. 2. Trace left pleural effusion. Electronically Signed   By: Vinnie Langton M.D.   On: 11/01/2021 06:23   CT Angio Chest PE W and/or Wo Contrast  Result Date: 10/31/2021 CLINICAL DATA:  Headache, body aches, and sore throat for the past 5 days. EXAM: CT ANGIOGRAPHY CHEST WITH CONTRAST TECHNIQUE: Multidetector CT imaging of the chest was performed using the standard protocol during bolus administration of intravenous contrast. Multiplanar CT image reconstructions and MIPs were obtained to evaluate the vascular anatomy. RADIATION  DOSE REDUCTION: This exam was performed according to the departmental dose-optimization program which includes automated exposure control, adjustment of the mA and/or kV according to patient size and/or use of iterative reconstruction technique. CONTRAST:  44mL OMNIPAQUE IOHEXOL 300 MG/ML  SOLN COMPARISON:  Chest x-ray from same day. CT chest dated May 12, 2021. FINDINGS: Cardiovascular: Satisfactory opacification of the pulmonary arteries to the segmental level. No evidence of pulmonary embolism. Normal heart size. New small simple pericardial effusion. Mediastinum/Nodes: Prominent bilateral axillary lymph nodes are unchanged and likely reactive. No enlarged mediastinal or hilar lymph nodes. Thyroid gland, trachea, and esophagus demonstrate no significant findings. Lungs/Pleura: Minimal atelectasis at the lung bases. No focal consolidation, pleural effusion, or pneumothorax. Upper Abdomen: No acute abnormality. Musculoskeletal: No chest wall abnormality. No acute or significant osseous findings. Review of the MIP images confirms the above findings. IMPRESSION: 1. No evidence of pulmonary embolism. 2. New small simple pericardial effusion. Electronically Signed   By: Titus Dubin M.D.   On: 10/31/2021 16:54   DG Chest Port 1 View  Result Date: 10/31/2021 CLINICAL DATA:  Fever, cough, sore throat and headache for 5 days. EXAM: PORTABLE CHEST 1 VIEW COMPARISON:  Radiographs 05/12/2021 and 01/13/2021.  CT 05/12/2021. FINDINGS: 1513 hours. Interval increased heart size, more than expected solely on the basis of portable AP technique. The pulmonary vascularity is normal. There is hypoventilation of both lung bases without confluent airspace opacity, significant pleural effusion or pneumothorax. The bones appear unremarkable. IMPRESSION: Interval increased size of the cardiac silhouette which could reflect cardiomegaly or a pericardial effusion. This may in part be technical given the AP technique and low lung  volumes. Recommend PA and lateral views when the patient is able. Electronically Signed   By: Richardean Sale M.D.   On: 10/31/2021 15:28    Cardiac Studies   See above  Assessment   Principal Problem:   Pericarditis Active Problems:   Hyponatremia   Pericardial effusion   Plan   Concern now for autoimmune pericarditis, given recurrent symptoms and high ESR - ANA and other studies pending to evaluate for possible lupus. Seems to be responding better to prednisone. Continue colchicine. Pain medicine PRN - will follow-up labs. Would likely need outpatient rheumatology follow-up. Not concerned for clinical myocarditis at this point (no s/s of CHF and clinically improving).  Time Spent Directly with Patient:  I have spent a total of 35 minutes with the patient reviewing hospital notes, telemetry, EKGs, labs and examining the patient as well as establishing an assessment and plan that was discussed personally with the patient.  > 50% of time was spent in direct patient care.  Length of Stay:  LOS: 2 days   Pixie Casino, MD, [facility], [place] Director of the Advanced Lipid Disorders &  Cardiovascular Risk Reduction Clinic Diplomate of  the [employer] of Clinical Lipidology Attending Cardiologist  Direct Dial: [phone]  Fax: [phone]  Website:  [email or web address]  Nadean [place] Ambrosio 11/02/2021, 10:22 AM

## 2021-11-03 DIAGNOSIS — E871 Hypo-osmolality and hyponatremia: Secondary | ICD-10-CM | POA: Diagnosis not present

## 2021-11-03 DIAGNOSIS — I3139 Other pericardial effusion (noninflammatory): Secondary | ICD-10-CM | POA: Diagnosis not present

## 2021-11-03 DIAGNOSIS — R700 Elevated erythrocyte sedimentation rate: Secondary | ICD-10-CM | POA: Diagnosis not present

## 2021-11-03 DIAGNOSIS — I300 Acute nonspecific idiopathic pericarditis: Secondary | ICD-10-CM | POA: Diagnosis not present

## 2021-11-03 LAB — COMPREHENSIVE METABOLIC PANEL
ALT: 20 U/L (ref 0–44)
AST: 19 U/L (ref 15–41)
Albumin: 2.5 g/dL — ABNORMAL LOW (ref 3.5–5.0)
Alkaline Phosphatase: 40 U/L (ref 38–126)
Anion gap: 8 (ref 5–15)
BUN: 13 mg/dL (ref 6–20)
CO2: 26 mmol/L (ref 22–32)
Calcium: 9.1 mg/dL (ref 8.9–10.3)
Chloride: 100 mmol/L (ref 98–111)
Creatinine, Ser: 0.75 mg/dL (ref 0.44–1.00)
GFR, Estimated: >60 mL/min (ref >60)
Glucose, Bld: 126 mg/dL — ABNORMAL HIGH (ref 70–99)
Potassium: 4.1 mmol/L (ref 3.5–5.1)
Sodium: 134 mmol/L — ABNORMAL LOW (ref 135–145)
Total Bilirubin: 0.4 mg/dL (ref 0.3–1.2)
Total Protein: 8.1 g/dL (ref 6.5–8.1)

## 2021-11-03 LAB — CBC
HCT: 31.9 % — ABNORMAL LOW (ref 36.0–46.0)
Hemoglobin: 10.4 g/dL — ABNORMAL LOW (ref 12.0–15.0)
MCH: 27.2 pg (ref 26.0–34.0)
MCHC: 32.6 g/dL (ref 30.0–36.0)
MCV: 83.5 fL (ref 80.0–100.0)
Platelets: 341 10*3/uL (ref 150–400)
RBC: 3.82 MIL/uL — ABNORMAL LOW (ref 3.87–5.11)
RDW: 13.8 % (ref 11.5–15.5)
WBC: 11.5 10*3/uL — ABNORMAL HIGH (ref 4.0–10.5)
nRBC: 0 % (ref 0.0–0.2)

## 2021-11-03 LAB — C4 COMPLEMENT: Complement C4, Body Fluid: 19 mg/dL (ref 12–38)

## 2021-11-03 LAB — C3 COMPLEMENT: C3 Complement: 137 mg/dL (ref 82–167)

## 2021-11-03 NOTE — Progress Notes (Signed)
DAILY PROGRESS NOTE   Patient Name: Tiffany Herrera Date of Encounter: 11/03/2021 Cardiologist: None  Chief Complaint   Chest pain has improved  Patient Profile   Tiffany Herrera is a 31 y.o. female with no significant past medical and surgical history who is being seen 11/01/2021 for the evaluation of pericarditis at the request of Dr. Louanne Belton.  Subjective   Intermittent chest pain episodes, however, reports chest pain is better. HR and respiratory rate have improved. Labs today are stable - hemoglobin stable at 10.4.   Objective   Vitals:   11/02/21 2328 11/03/21 0311 11/03/21 0500 11/03/21 0734  BP: 112/76 104/83  108/72  Pulse: 89 89  88  Resp: 20 (!) 24  (!) 23  Temp: 97.9 F (36.6 C) 98 F (36.7 C)  98 F (36.7 C)  TempSrc: Oral Oral  Oral  SpO2: 97%   96%  Weight:   79.8 kg   Height:        Intake/Output Summary (Last 24 hours) at 11/03/2021 6948 Last data filed at 11/03/2021 0800 Gross per 24 hour  Intake 240 ml  Output --  Net 240 ml   Filed Weights   11/01/21 0359 11/02/21 0454 11/03/21 0500  Weight: 78.8 kg 79.1 kg 79.8 kg    Physical Exam   General appearance: alert, no distress, and sitting upright Neck: no carotid bruit, no JVD, and thyroid not enlarged, symmetric, no tenderness/mass/nodules Lungs: diminished breath sounds bibasilar Heart: regular rate and rhythm and no rub Abdomen: soft, non-tender; bowel sounds normal; no masses,  no organomegaly Extremities: extremities normal, atraumatic, no cyanosis or edema Pulses: 2+ and symmetric Skin: Skin color, texture, turgor normal. No rashes or lesions Neurologic: Grossly normal Psych: Pleasant  Inpatient Medications    Scheduled Meds:  colchicine  0.6 mg Oral BID   predniSONE  40 mg Oral Q breakfast   sodium chloride flush  3 mL Intravenous Q12H    Continuous Infusions:   PRN Meds: acetaminophen **OR** acetaminophen, ondansetron **OR** ondansetron (ZOFRAN) IV,  oxyCODONE-acetaminophen, senna-docusate   Labs   Results for orders placed or performed during the hospital encounter of 10/31/21 (from the past 48 hour(s))  Anti-DNA antibody, double-stranded     Status: None   Collection Time: 11/01/21  4:05 PM  Result Value Ref Range   ds DNA Ab 2 0 - 9 IU/mL    Comment: (NOTE)                                   Negative      <5                                   Equivocal  5 - 9                                   Positive      >9 Performed At: [facility] [place], [place] [id number] Rush Farmer MD KX:[id number]   Sedimentation rate     Status: Abnormal   Collection Time: 11/01/21  4:05 PM  Result Value Ref Range   Sed Rate 123 (H) 0 - 22 mm/hr    Comment: Performed at [facility], [place] [address], [place], [place] [address]  C-reactive  protein     Status: Abnormal   Collection Time: 11/01/21  4:05 PM  Result Value Ref Range   CRP 24.6 (H) <1.0 mg/dL    Comment: Performed at [facility], [place] [address] [place], [place] [address]  Comprehensive metabolic panel     Status: Abnormal   Collection Time: 11/02/21 12:52 AM  Result Value Ref Range   Sodium 134 (L) 135 - 145 mmol/L   Potassium 4.1 3.5 - 5.1 mmol/L   Chloride 105 98 - 111 mmol/L   CO2 20 (L) 22 - 32 mmol/L   Glucose, Bld 171 (H) 70 - 99 mg/dL    Comment: Glucose reference range applies only to samples taken after fasting for at least 8 hours.   BUN 8 6 - 20 mg/dL   Creatinine, Ser 0.81 0.44 - 1.00 mg/dL   Calcium 8.8 (L) 8.9 - 10.3 mg/dL   Total Protein 7.9 6.5 - 8.1 g/dL   Albumin 2.5 (L) 3.5 - 5.0 g/dL   AST 24 15 - 41 U/L   ALT 21 0 - 44 U/L   Alkaline Phosphatase 43 38 - 126 U/L   Total Bilirubin 0.4 0.3 - 1.2 mg/dL   GFR, Estimated >60 >60 mL/min    Comment: (NOTE) Calculated using the CKD-EPI Creatinine Equation (2021)    Anion gap 9 5 - 15    Comment: Performed at [facility], [place] [address], [place], [place] [address]   CBC     Status: Abnormal   Collection Time: 11/02/21 12:52 AM  Result Value Ref Range   WBC 10.2 4.0 - 10.5 K/uL   RBC 3.83 (L) 3.87 - 5.11 MIL/uL   Hemoglobin 10.5 (L) 12.0 - 15.0 g/dL   HCT 32.1 (L) 36.0 - 46.0 %   MCV 83.8 80.0 - 100.0 fL   MCH 27.4 26.0 - 34.0 pg   MCHC 32.7 30.0 - 36.0 g/dL   RDW 13.8 11.5 - 15.5 %   Platelets 253 150 - 400 K/uL   nRBC 0.0 0.0 - 0.2 %    Comment: Performed at [facility], [place] [address], [place],  [address]  Comprehensive metabolic panel     Status: Abnormal   Collection Time: 11/03/21 12:34 AM  Result Value Ref Range   Sodium 134 (L) 135 - 145 mmol/L   Potassium 4.1 3.5 - 5.1 mmol/L   Chloride 100 98 - 111 mmol/L   CO2 26 22 - 32 mmol/L   Glucose, Bld 126 (H) 70 - 99 mg/dL    Comment: Glucose reference range applies only to samples taken after fasting for at least 8 hours.   BUN 13 6 - 20 mg/dL   Creatinine, Ser 0.75 0.44 - 1.00 mg/dL   Calcium 9.1 8.9 - 10.3 mg/dL   Total Protein 8.1 6.5 - 8.1 g/dL   Albumin 2.5 (L) 3.5 - 5.0 g/dL   AST 19 15 - 41 U/L   ALT 20 0 - 44 U/L   Alkaline Phosphatase 40 38 - 126 U/L   Total Bilirubin 0.4 0.3 - 1.2 mg/dL   GFR, Estimated >60 >60 mL/min    Comment: (NOTE) Calculated using the CKD-EPI Creatinine Equation (2021)    Anion gap 8 5 - 15    Comment: Performed at [place] [address], [place],  [address]  CBC     Status: Abnormal   Collection Time: 11/03/21 12:34 AM  Result Value Ref Range  WBC 11.5 (H) 4.0 - 10.5 K/uL   RBC 3.82 (L) 3.87 - 5.11 MIL/uL   Hemoglobin 10.4 (L) 12.0 - 15.0 g/dL   HCT 31.9 (L) 36.0 - 46.0 %   MCV 83.5 80.0 - 100.0 fL   MCH 27.2 26.0 - 34.0 pg   MCHC 32.6 30.0 - 36.0 g/dL   RDW 13.8 11.5 - 15.5 %   Platelets 341 150 - 400 K/uL   nRBC 0.0 0.0 - 0.2 %    Comment: Performed at [facility], [place] [address] [place], [place] [address]    ECG   N/A  Telemetry   Sinus rhythm - Personally Reviewed  Radiology     ECHOCARDIOGRAM COMPLETE  Result Date: 11/01/2021    ECHOCARDIOGRAM REPORT   Patient Name:   Tiffany Herrera Date of Exam: 11/01/2021 Medical Rec #:  [id number]        Height:       66.0 in Accession #:    [id number]       Weight:       173.7 lb Date of Birth:  03/18/1990       BSA:          1.884 m Patient Age:    30 years         BP:           101/64 mmHg Patient Gender: F                HR:           105 bpm. Exam Location:  Inpatient Procedure: 2D Echo, Cardiac Doppler and Color Doppler Indications:    Pericardial Effusion  History:        Patient has no prior history of Echocardiogram examinations.                 Pericarditis.  Sonographer:    Ronny Flurry Sonographer#2:  Johny Chess RDCS Referring Phys: [id number] [place]  1. Left ventricular ejection fraction, by estimation, is 65 to 70%. Left ventricular ejection fraction by PLAX is 68 %. The left ventricle has normal function. The left ventricle has no regional wall motion abnormalities. Left ventricular diastolic parameters are consistent with Grade I diastolic dysfunction (impaired relaxation).  2. Right ventricular systolic function is normal. The right ventricular size is normal.  3. A small pericardial effusion is present. The pericardial effusion is circumferential. There is no evidence of cardiac tamponade.  4. The mitral valve is normal in structure. No evidence of mitral valve regurgitation. No evidence of mitral stenosis.  5. The aortic valve is tricuspid. Aortic valve regurgitation is not visualized. No aortic stenosis is present.  6. The inferior vena cava is normal in size with greater than 50% respiratory variability, suggesting right atrial pressure of 3 mmHg. FINDINGS  Left Ventricle: Left ventricular ejection fraction, by estimation, is 65 to 70%. Left ventricular ejection fraction by PLAX is 68 %. The left ventricle has normal function. The left ventricle has no regional wall motion abnormalities. The left  ventricular internal cavity size was normal in size. There is no left ventricular hypertrophy. Left ventricular diastolic parameters are consistent with Grade I diastolic dysfunction (impaired relaxation). Right Ventricle: The right ventricular size is normal. No increase in right ventricular wall thickness. Right ventricular systolic function is normal. Left Atrium: Left atrial size was normal in size. Right Atrium: Right atrial size was normal in size. Pericardium: A small pericardial effusion is present. The pericardial effusion  is circumferential. There is no evidence of cardiac tamponade. Mitral Valve: The mitral valve is normal in structure. No evidence of mitral valve regurgitation. No evidence of mitral valve stenosis. Tricuspid Valve: The tricuspid valve is normal in structure. Tricuspid valve regurgitation is not demonstrated. No evidence of tricuspid stenosis. Aortic Valve: The aortic valve is tricuspid. Aortic valve regurgitation is not visualized. No aortic stenosis is present. Pulmonic Valve: The pulmonic valve was normal in structure. Pulmonic valve regurgitation is not visualized. No evidence of pulmonic stenosis. Aorta: The aortic root is normal in size and structure. Venous: The inferior vena cava is normal in size with greater than 50% respiratory variability, suggesting right atrial pressure of 3 mmHg. IAS/Shunts: No atrial level shunt detected by color flow Doppler.  LEFT VENTRICLE PLAX 2D LV EF:         Left ventricular ejection fraction by PLAX is 68 %. LVIDd:         4.00 cm LVIDs:         2.50 cm LV PW:         1.20 cm LV IVS:        1.20 cm LVOT diam:     2.00 cm LV SV:         68 LV SV Index:   36 LVOT Area:     3.14 cm  RIGHT VENTRICLE RV S prime:     18.80 cm/s TAPSE (M-mode): 2.8 cm LEFT ATRIUM             Index        RIGHT ATRIUM          Index LA diam:        3.10 cm 1.65 cm/m   RA Area:     9.77 cm LA Vol (A2C):   36.4 ml 19.32 ml/m  RA Volume:   16.30 ml 8.65 ml/m LA Vol  (A4C):   55.3 ml 29.36 ml/m LA Biplane Vol: 45.6 ml 24.21 ml/m  AORTIC VALVE LVOT Vmax:   141.00 cm/s LVOT Vmean:  93.433 cm/s LVOT VTI:    0.215 m  AORTA Ao Root diam: 3.10 cm Ao Asc diam:  3.30 cm MITRAL VALVE MV Area (PHT): 5.13 cm    SHUNTS MV Decel Time: 148 msec    Systemic VTI:  0.22 m MV E velocity: 92.00 cm/s  Systemic Diam: 2.00 cm MV A velocity: 84.00 cm/s MV E/A ratio:  1.10 Skeet Latch MD Electronically signed by Skeet Latch MD Signature Date/Time: 11/01/2021/12:31:51 PM    Final     Cardiac Studies   See above  Assessment   Principal Problem:   Pericarditis Active Problems:   Hyponatremia   Pericardial effusion   Plan   Labs remain pending- will repeat ESR/CRP tomorrow, check limited echo for change in effusion. Would monitor until labs return to help establish diagnosis. Try to wean off oxygen today.  Time Spent Directly with Patient:  I have spent a total of 25 minutes with the patient reviewing hospital notes, telemetry, EKGs, labs and examining the patient as well as establishing an assessment and plan that was discussed personally with the patient.  > 50% of time was spent in direct patient care.  Length of Stay:  LOS: 3 days   Pixie Casino, MD, [facility], [place] Director of the Advanced Lipid Disorders &  Cardiovascular Risk Reduction Clinic Diplomate of the American Board of Clinical Lipidology Attending Cardiologist  Direct Dial:  [phone]  Fax: [phone]  Website:  [email or web address] Osgood Dino Borntreger 11/03/2021, 9:21 AM

## 2021-11-03 NOTE — Plan of Care (Signed)
Discussed with patient plan of care for the evening, pain management and bring outside foood with some teach back displayed.  Problem: Education: Goal: Knowledge of General Education information will improve Description: Including pain rating scale, medication(s)/side effects and non-pharmacologic comfort measures Outcome: Progressing

## 2021-11-03 NOTE — Progress Notes (Signed)
PROGRESS NOTE    Tiffany Herrera  QZE:[id number] DOB: [birth year] DOA: 10/31/2021 PCP: Dorna Mai, MD    Brief Narrative:   Tiffany Herrera is a 31 years old female with no significant past medical history presented to hospital with chest pain which was preceded by fever sore throat generalized aches and malaise with decreased appetite 4 days prior to presentation.  Patient complained of central chest pain sharp in nature more on lying back in taking a deep breath or coughing.  In the ED patient was noted to be febrile tachycardic with borderline low blood pressure.  EKG showed sinus tachycardia.  Chest x-ray showed increased cardiac silhouette.  CT of the chest was negative for PE but a small pericardial effusion.  Troponin was slightly elevated at 38-30.  Cardiology was consulted from the ED and patient was admitted to hospital for possibility of acute pericarditis.    During hospitalization, patient continued to have chest pain but slightly improved.  Patient was initially on NSAID and colchicine which was changed to colchicine and prednisone.  Treatment work-up pending.  Assessment and Plan: Principal Problem:   Pericarditis Active Problems:   Hyponatremia   Pericardial effusion   Acute pericarditis  Sounded like a viral pericarditis on presentation but has elevated ESR and CRP.  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 On prednisone and colchicine at this time. CTA chest was negative for PE but small simple pericardial effusion.  Troponin mildly elevated and flat.  COVID and influenza negative.  Respiratory viral panel was negative.  2D echocardiogram with LV ejection fraction of 65 to 70% with no regional wall motion abnormality, grade 1 diastolic dysfunction.  No fever or leukocytosis, ANA pending, C4-5 C3 complement pending.  Double-stranded DNA antibody negative.   Hyponatremia  - Serum sodium is 129 on admission.  Has improved with IV fluids.  Latest sodium  of 134.   DVT prophylaxis: SCDs Start: 10/31/21 2252   Code Status:     Code Status: Full Code  Disposition:  Home likely 1 to 2 days when pain adequately controlled okay with cardiology  Status is: Inpatient  Remains inpatient appropriate because: Acute  pericarditis,  pending clinical improvement, follow cardiology recommendations    Family Communication:  Spoke with the patient's uncle on the phone on 08/02/2021  Consultants:  Cardiology  Procedures:  None  Antimicrobials:  None  Anti-infectives (From admission, onward)    None      Subjective: Today, patient was seen and examined at bedside.  States that she feels around 70% better but still has mild chest pain not too much of shortness of breath.   Objective: Vitals:   11/02/21 2328 11/03/21 0311 11/03/21 0500 11/03/21 0734  BP: 112/76 104/83  108/72  Pulse: 89 89    Resp: 20 (!) 24  (!) 23  Temp: 97.9 F (36.6 C) 98 F (36.7 C)  98 F (36.7 C)  TempSrc: Oral Oral  Oral  SpO2: 97%     Weight:   79.8 kg   Height:       No intake or output data in the 24 hours ending 11/03/21 0755  Filed Weights   11/01/21 0359 11/02/21 0454 11/03/21 0500  Weight: 78.8 kg 79.1 kg 79.8 kg    Physical Examination: Body mass index is 28.41 kg/m.   General:  Average built, not in obvious distress, on nasal cannula oxygen HENT:   No scleral pallor or icterus noted. Oral mucosa is moist.  Chest:  Clear  breath sounds.  Diminished breath sounds bilaterally. No crackles or wheezes.  Mild tachypnea due to splinting. CVS: S1 &S2 heard. No murmur.  Regular rate and rhythm. Abdomen: Soft, nontender, nondistended.  Bowel sounds are heard.   Extremities: No cyanosis, clubbing or edema.  Peripheral pulses are palpable. Psych: Alert, awake and oriented, normal mood CNS:  No cranial nerve deficits.  Power equal in all extremities.   Skin: Warm and dry.  No rashes noted.  Data Reviewed:   CBC: Recent Labs  Lab  10/31/21 1526 11/01/21 0537 11/02/21 0052 11/03/21 0034  WBC 8.3 7.8 10.2 11.5*  NEUTROABS 6.1  --   --   --   HGB 11.2* 11.1* 10.5* 10.4*  HCT 33.4* 34.8* 32.1* 31.9*  MCV 83.3 85.9 83.8 83.5  PLT 255 252 253 341     Basic Metabolic Panel: Recent Labs  Lab 10/31/21 1526 11/01/21 0537 11/02/21 0052 11/03/21 0034  NA 129* 136 134* 134*  K 3.7 3.8 4.1 4.1  CL 99 107 105 100  CO2 23 24 20* 26  GLUCOSE 121* 124* 171* 126*  BUN 8 8 8 13   CREATININE 0.90 0.86 0.81 0.75  CALCIUM 8.3* 8.2* 8.8* 9.1     Liver Function Tests: Recent Labs  Lab 10/31/21 1526 11/01/21 0537 11/02/21 0052 11/03/21 0034  AST 27 22 24 19   ALT 18 18 21 20   ALKPHOS 37* 36* 43 40  BILITOT 0.7 0.4 0.4 0.4  PROT 8.6* 7.9 7.9 8.1  ALBUMIN 3.0* 2.6* 2.5* 2.5*      Radiology Studies: ECHOCARDIOGRAM COMPLETE  Result Date: 11/01/2021    ECHOCARDIOGRAM REPORT   Patient Name:   Tiffany Herrera Date of Exam: 11/01/2021 Medical Rec #:  [id number]        Height:       66.0 in Accession #:    [id number]       Weight:       173.7 lb Date of Birth:  1990-11-09       BSA:          1.884 m Patient Age:    30 years         BP:           101/64 mmHg Patient Gender: F                HR:           105 bpm. Exam Location:  Inpatient Procedure: 2D Echo, Cardiac Doppler and Color Doppler Indications:    Pericardial Effusion  History:        Patient has no prior history of Echocardiogram examinations.                 Pericarditis.  Sonographer:    Ronny Flurry Sonographer#2:  Johny Chess RDCS Referring Phys: [id number] [place]  1. Left ventricular ejection fraction, by estimation, is 65 to 70%. Left ventricular ejection fraction by PLAX is 68 %. The left ventricle has normal function. The left ventricle has no regional wall motion abnormalities. Left ventricular diastolic parameters are consistent with Grade I diastolic dysfunction (impaired relaxation).  2. Right ventricular systolic function is  normal. The right ventricular size is normal.  3. A small pericardial effusion is present. The pericardial effusion is circumferential. There is no evidence of cardiac tamponade.  4. The mitral valve is normal in structure. No evidence of mitral valve regurgitation. No evidence of mitral stenosis.  5. The aortic valve is tricuspid. Aortic valve  regurgitation is not visualized. No aortic stenosis is present.  6. The inferior vena cava is normal in size with greater than 50% respiratory variability, suggesting right atrial pressure of 3 mmHg. FINDINGS  Left Ventricle: Left ventricular ejection fraction, by estimation, is 65 to 70%. Left ventricular ejection fraction by PLAX is 68 %. The left ventricle has normal function. The left ventricle has no regional wall motion abnormalities. The left ventricular internal cavity size was normal in size. There is no left ventricular hypertrophy. Left ventricular diastolic parameters are consistent with Grade I diastolic dysfunction (impaired relaxation). Right Ventricle: The right ventricular size is normal. No increase in right ventricular wall thickness. Right ventricular systolic function is normal. Left Atrium: Left atrial size was normal in size. Right Atrium: Right atrial size was normal in size. Pericardium: A small pericardial effusion is present. The pericardial effusion is circumferential. There is no evidence of cardiac tamponade. Mitral Valve: The mitral valve is normal in structure. No evidence of mitral valve regurgitation. No evidence of mitral valve stenosis. Tricuspid Valve: The tricuspid valve is normal in structure. Tricuspid valve regurgitation is not demonstrated. No evidence of tricuspid stenosis. Aortic Valve: The aortic valve is tricuspid. Aortic valve regurgitation is not visualized. No aortic stenosis is present. Pulmonic Valve: The pulmonic valve was normal in structure. Pulmonic valve regurgitation is not visualized. No evidence of pulmonic stenosis.  Aorta: The aortic root is normal in size and structure. Venous: The inferior vena cava is normal in size with greater than 50% respiratory variability, suggesting right atrial pressure of 3 mmHg. IAS/Shunts: No atrial level shunt detected by color flow Doppler.  LEFT VENTRICLE PLAX 2D LV EF:         Left ventricular ejection fraction by PLAX is 68 %. LVIDd:         4.00 cm LVIDs:         2.50 cm LV PW:         1.20 cm LV IVS:        1.20 cm LVOT diam:     2.00 cm LV SV:         68 LV SV Index:   36 LVOT Area:     3.14 cm  RIGHT VENTRICLE RV S prime:     18.80 cm/s TAPSE (M-mode): 2.8 cm LEFT ATRIUM             Index        RIGHT ATRIUM          Index LA diam:        3.10 cm 1.65 cm/m   RA Area:     9.77 cm LA Vol (A2C):   36.4 ml 19.32 ml/m  RA Volume:   16.30 ml 8.65 ml/m LA Vol (A4C):   55.3 ml 29.36 ml/m LA Biplane Vol: 45.6 ml 24.21 ml/m  AORTIC VALVE LVOT Vmax:   141.00 cm/s LVOT Vmean:  93.433 cm/s LVOT VTI:    0.215 m  AORTA Ao Root diam: 3.10 cm Ao Asc diam:  3.30 cm MITRAL VALVE MV Area (PHT): 5.13 cm    SHUNTS MV Decel Time: 148 msec    Systemic VTI:  0.22 m MV E velocity: 92.00 cm/s  Systemic Diam: 2.00 cm MV A velocity: 84.00 cm/s MV E/A ratio:  1.10 Skeet Latch MD Electronically signed by Skeet Latch MD Signature Date/Time: 11/01/2021/12:31:51 PM    Final       LOS: 3 days    Flora Lipps, MD [facility]  Available via Epic secure chat 7am-7pm After these hours, please refer to coverage provider listed on [email or web address] 11/03/2021, 7:55 AM

## 2021-11-04 ENCOUNTER — Inpatient Hospital Stay (HOSPITAL_COMMUNITY): Payer: Medicaid Other

## 2021-11-04 DIAGNOSIS — I3139 Other pericardial effusion (noninflammatory): Secondary | ICD-10-CM | POA: Diagnosis not present

## 2021-11-04 DIAGNOSIS — I300 Acute nonspecific idiopathic pericarditis: Secondary | ICD-10-CM | POA: Diagnosis not present

## 2021-11-04 DIAGNOSIS — E871 Hypo-osmolality and hyponatremia: Secondary | ICD-10-CM | POA: Diagnosis not present

## 2021-11-04 LAB — BASIC METABOLIC PANEL
Anion gap: 8 (ref 5–15)
BUN: 13 mg/dL (ref 6–20)
CO2: 26 mmol/L (ref 22–32)
Calcium: 9.1 mg/dL (ref 8.9–10.3)
Chloride: 100 mmol/L (ref 98–111)
Creatinine, Ser: 0.77 mg/dL (ref 0.44–1.00)
GFR, Estimated: >60 mL/min (ref >60)
Glucose, Bld: 114 mg/dL — ABNORMAL HIGH (ref 70–99)
Potassium: 3.7 mmol/L (ref 3.5–5.1)
Sodium: 134 mmol/L — ABNORMAL LOW (ref 135–145)

## 2021-11-04 LAB — ECHOCARDIOGRAM LIMITED
Height: 66 in
S' Lateral: 2.8 cm
Weight: 2772.5 oz

## 2021-11-04 LAB — CBC
HCT: 33.4 % — ABNORMAL LOW (ref 36.0–46.0)
Hemoglobin: 10.9 g/dL — ABNORMAL LOW (ref 12.0–15.0)
MCH: 27.4 pg (ref 26.0–34.0)
MCHC: 32.6 g/dL (ref 30.0–36.0)
MCV: 83.9 fL (ref 80.0–100.0)
Platelets: 429 10*3/uL — ABNORMAL HIGH (ref 150–400)
RBC: 3.98 MIL/uL (ref 3.87–5.11)
RDW: 13.7 % (ref 11.5–15.5)
WBC: 9.3 10*3/uL (ref 4.0–10.5)
nRBC: 0 % (ref 0.0–0.2)

## 2021-11-04 LAB — C-REACTIVE PROTEIN: CRP: 18.8 mg/dL — ABNORMAL HIGH (ref <1.0)

## 2021-11-04 LAB — FANA STAINING PATTERNS: Speckled Pattern: 24529 — ABNORMAL HIGH

## 2021-11-04 LAB — ANTINUCLEAR ANTIBODIES, IFA: ANA Ab, IFA: POSITIVE — AB

## 2021-11-04 LAB — SEDIMENTATION RATE: Sed Rate: 121 mm/hr — ABNORMAL HIGH (ref 0–22)

## 2021-11-04 MED ORDER — PREDNISONE 20 MG PO TABS: 40.0000 mg | ORAL_TABLET | Freq: Every day | ORAL | 0 refills | Status: AC

## 2021-11-04 MED ORDER — PREDNISONE 20 MG PO TABS
40.0000 mg | ORAL_TABLET | Freq: Every day | ORAL | Status: DC
  Administered 2021-11-04: 40 mg via ORAL
  Filled 2021-11-04: qty 2

## 2021-11-04 MED ORDER — ACETAMINOPHEN 325 MG PO TABS: 650.0000 mg | ORAL_TABLET | Freq: Four times a day (QID) | ORAL | Status: AC | PRN

## 2021-11-04 MED ORDER — COLCHICINE 0.6 MG PO TABS: 0.6000 mg | ORAL_TABLET | Freq: Two times a day (BID) | ORAL | 0 refills | Status: DC

## 2021-11-04 MED ORDER — IBUPROFEN 200 MG PO TABS: 400.0000 mg | ORAL_TABLET | Freq: Four times a day (QID) | ORAL | 0 refills | Status: AC | PRN

## 2021-11-04 MED ORDER — OMEPRAZOLE 20 MG PO CPDR: 20.0000 mg | DELAYED_RELEASE_CAPSULE | Freq: Every day | ORAL | 0 refills | Status: DC

## 2021-11-04 NOTE — Progress Notes (Signed)
Full note pending. Discharge recommendations:  [facility] will sign off.   Medication Recommendations:   Continue prednisone 40 mg daily for at least two weeks. Longer term duration to be determined at follow up. If symptoms improved, would do slow taper as an outpatient Continue colchicine 0.6 mg BID for three months Other recommendations (labs, testing, etc):  Would recommend referral to rheumatology as an outpatient Follow up as an outpatient:  Scheduled to see Azalee Course at the [facility] office on 11/13/21  Jodelle Red, MD, PhD, [facility] [place]  [facility]  [place]  Heart & Vascular at [facility] at [facility] [address] [place], [place] [address] [phone]

## 2021-11-04 NOTE — Progress Notes (Signed)
  Echocardiogram 2D Echocardiogram has been performed.  Tiffany Herrera 11/04/2021, 9:40 AM

## 2021-11-04 NOTE — Discharge Summary (Signed)
Physician Discharge Summary  Tiffany Herrera ERD:[id number] DOB: 10/13/1990 DOA: 10/31/2021  PCP: Dorna Mai, MD  Admit date: 10/31/2021 Discharge date: 11/04/2021  Admitted From: Home  Discharge disposition: Home  Recommendations for Outpatient Follow-Up:   Follow up with your primary care provider in one week.  Follow-up with cardiology as has been scheduled on 11/13/2021.  Patient has been prescribed prednisone and colchicine.  We will need to reassess prednisone need in the next visit. Check CBC, BMP, magnesium in the next visit Due to concern for possible autoimmune pathology for pericarditis, internal referral has been made to rheumatology, ANA is pending.  Discharge Diagnosis:   Principal Problem:   Pericarditis Active Problems:   Hyponatremia   Pericardial effusion   Discharge Condition: Improved.  Diet recommendation:  Regular.  Wound care: None.  Code status: Full.   History of Present Illness:   Tiffany Herrera is a 31 years old female with no significant past medical history presented to the hospital with chest pain which was preceded by fever, sore throat generalized aches and malaise with decreased appetite 4 days prior to presentation.  Patient complained of central chest pain sharp in nature more on lying back in taking a deep breath or coughing.  In the ED, patient was noted to be febrile, tachycardic with borderline low blood pressure.  EKG showed sinus tachycardia.  Chest x-ray showed increased cardiac silhouette.  CT of the chest was negative for pulmonary embolism but a small pericardial effusion.  Troponin was slightly elevated at 38-30.  Cardiology was consulted from the ED and patient was admitted to hospital for possibility of acute pericarditis.     Hospital Course:   Following conditions were addressed during hospitalization as listed below,  Acute pericarditis  Sounded like a viral pericarditis on presentation elevated ESR and CRP.  She  did have 1 episode of previous pericarditis like this in the past so there is concern for possibility of autoimmune disease.  Patient is currently on prednisone and colchicine and plan is continuation of colchicine for 3 months and prednisone for at least 2 weeks until next cardiology follow-up.  Might need slow taper of prednisone depending upon response.  CTA chest was negative for PE but small simple pericardial effusion.  Troponin mildly elevated and flat.  COVID and influenza negative.  Respiratory viral panel was negative.  2D echocardiogram with LV ejection fraction of 65 to 70% with no regional wall motion abnormality, grade 1 diastolic dysfunction.  No fever or leukocytosis, ANA pending, C4-5 C3 complement is within normal limits..  Double-stranded DNA antibody negative.   Hyponatremia  - Serum sodium is 129 on admission.  Has improved with IV fluids.  Latest sodium of 134.  Disposition.  At this time, patient is stable for disposition home with cardiology follow-up with rheumatology referral.  Medical Consultants:   Cardiology  Procedures:    None Subjective:   Today, patient was seen and examined at bedside.  Continues to feel better overall with breathing.  Pain only when coughing.  No fever chills nausea vomiting.  Discharge Exam:   Vitals:   11/04/21 0748 11/04/21 1115  BP: 107/71 100/66  Pulse: (!) 108 83  Resp: 20 20  Temp: 98.1 F (36.7 C) 98.2 F (36.8 C)  SpO2: 91% 92%   Vitals:   11/04/21 0306 11/04/21 0530 11/04/21 0748 11/04/21 1115  BP: 111/89  107/71 100/66  Pulse: 93  (!) 108 83  Resp: 20 17 20 20   Temp: 98.3 F (  36.8 C)  98.1 F (36.7 C) 98.2 F (36.8 C)  TempSrc: Oral  Oral Oral  SpO2: 97%  91% 92%  Weight: 78.6 kg     Height:        General: Alert awake, not in obvious distress HENT: pupils equally reacting to light,  No scleral pallor or icterus noted. Oral mucosa is moist.  Chest:  Clear breath sounds.  Diminished breath sounds  bilaterally. No crackles or wheezes.  CVS: S1 &S2 heard. No murmur.  Regular rate and rhythm. Abdomen: Soft, nontender, nondistended.  Bowel sounds are heard.   Extremities: No cyanosis, clubbing or edema.  Peripheral pulses are palpable. Psych: Alert, awake and oriented, normal mood CNS:  No cranial nerve deficits.  Power equal in all extremities.   Skin: Warm and dry.  No rashes noted.  The results of significant diagnostics from this hospitalization (including imaging, microbiology, ancillary and laboratory) are listed below for reference.     Diagnostic Studies:   ECHOCARDIOGRAM COMPLETE  Result Date: 11/01/2021    ECHOCARDIOGRAM REPORT   Patient Name:   Tiffany Herrera Date of Exam: 11/01/2021 Medical Rec #:  [id number]        Height:       66.0 in Accession #:    [id number]       Weight:       173.7 lb Date of Birth:  [birth year]       BSA:          1.884 m Patient Age:    30 years         BP:           101/64 mmHg Patient Gender: F                HR:           105 bpm. Exam Location:  Inpatient Procedure: 2D Echo, Cardiac Doppler and Color Doppler Indications:    Pericardial Effusion  History:        Patient has no prior history of Echocardiogram examinations.                 Pericarditis.  Sonographer:    Ronny Flurry Sonographer#2:  Johny Chess RDCS Referring Phys: [id number] [place]  1. Left ventricular ejection fraction, by estimation, is 65 to 70%. Left ventricular ejection fraction by PLAX is 68 %. The left ventricle has normal function. The left ventricle has no regional wall motion abnormalities. Left ventricular diastolic parameters are consistent with Grade I diastolic dysfunction (impaired relaxation).  2. Right ventricular systolic function is normal. The right ventricular size is normal.  3. A small pericardial effusion is present. The pericardial effusion is circumferential. There is no evidence of cardiac tamponade.  4. The mitral valve is normal in  structure. No evidence of mitral valve regurgitation. No evidence of mitral stenosis.  5. The aortic valve is tricuspid. Aortic valve regurgitation is not visualized. No aortic stenosis is present.  6. The inferior vena cava is normal in size with greater than 50% respiratory variability, suggesting right atrial pressure of 3 mmHg. FINDINGS  Left Ventricle: Left ventricular ejection fraction, by estimation, is 65 to 70%. Left ventricular ejection fraction by PLAX is 68 %. The left ventricle has normal function. The left ventricle has no regional wall motion abnormalities. The left ventricular internal cavity size was normal in size. There is no left ventricular hypertrophy. Left ventricular diastolic parameters are consistent with Grade I diastolic dysfunction (impaired relaxation).  Right Ventricle: The right ventricular size is normal. No increase in right ventricular wall thickness. Right ventricular systolic function is normal. Left Atrium: Left atrial size was normal in size. Right Atrium: Right atrial size was normal in size. Pericardium: A small pericardial effusion is present. The pericardial effusion is circumferential. There is no evidence of cardiac tamponade. Mitral Valve: The mitral valve is normal in structure. No evidence of mitral valve regurgitation. No evidence of mitral valve stenosis. Tricuspid Valve: The tricuspid valve is normal in structure. Tricuspid valve regurgitation is not demonstrated. No evidence of tricuspid stenosis. Aortic Valve: The aortic valve is tricuspid. Aortic valve regurgitation is not visualized. No aortic stenosis is present. Pulmonic Valve: The pulmonic valve was normal in structure. Pulmonic valve regurgitation is not visualized. No evidence of pulmonic stenosis. Aorta: The aortic root is normal in size and structure. Venous: The inferior vena cava is normal in size with greater than 50% respiratory variability, suggesting right atrial pressure of 3 mmHg. IAS/Shunts: No  atrial level shunt detected by color flow Doppler.  LEFT VENTRICLE PLAX 2D LV EF:         Left ventricular ejection fraction by PLAX is 68 %. LVIDd:         4.00 cm LVIDs:         2.50 cm LV PW:         1.20 cm LV IVS:        1.20 cm LVOT diam:     2.00 cm LV SV:         68 LV SV Index:   36 LVOT Area:     3.14 cm  RIGHT VENTRICLE RV S prime:     18.80 cm/s TAPSE (M-mode): 2.8 cm LEFT ATRIUM             Index        RIGHT ATRIUM          Index LA diam:        3.10 cm 1.65 cm/m   RA Area:     9.77 cm LA Vol (A2C):   36.4 ml 19.32 ml/m  RA Volume:   16.30 ml 8.65 ml/m LA Vol (A4C):   55.3 ml 29.36 ml/m LA Biplane Vol: 45.6 ml 24.21 ml/m  AORTIC VALVE LVOT Vmax:   141.00 cm/s LVOT Vmean:  93.433 cm/s LVOT VTI:    0.215 m  AORTA Ao Root diam: 3.10 cm Ao Asc diam:  3.30 cm MITRAL VALVE MV Area (PHT): 5.13 cm    SHUNTS MV Decel Time: 148 msec    Systemic VTI:  0.22 m MV E velocity: 92.00 cm/s  Systemic Diam: 2.00 cm MV A velocity: 84.00 cm/s MV E/A ratio:  1.10 Skeet Latch MD Electronically signed by Skeet Latch MD Signature Date/Time: 11/01/2021/12:31:51 PM    Final    DG Chest 1 View  Result Date: 11/01/2021 CLINICAL DATA:  31 year old female with history of increasing shortness of breath and chest pain. EXAM: CHEST  1 VIEW COMPARISON:  Chest x-ray 10/31/2021. FINDINGS: Lung volumes are normal. No consolidative airspace disease. Trace left pleural effusion. No right pleural effusion. No pneumothorax. No evidence of pulmonary edema. Enlargement of the cardiopericardial silhouette. Upper mediastinal contours are within normal limits. IMPRESSION: 1. Persistent enlargement of the cardiopericardial silhouette, which could suggest cardiomegaly and/or presence of pericardial effusion. 2. Trace left pleural effusion. Electronically Signed   By: Vinnie Langton M.D.   On: 11/01/2021 06:23   CT Angio Chest PE W and/or  Wo Contrast  Result Date: 10/31/2021 CLINICAL DATA:  Headache, body aches, and sore  throat for the past 5 days. EXAM: CT ANGIOGRAPHY CHEST WITH CONTRAST TECHNIQUE: Multidetector CT imaging of the chest was performed using the standard protocol during bolus administration of intravenous contrast. Multiplanar CT image reconstructions and MIPs were obtained to evaluate the vascular anatomy. RADIATION DOSE REDUCTION: This exam was performed according to the departmental dose-optimization program which includes automated exposure control, adjustment of the mA and/or kV according to patient size and/or use of iterative reconstruction technique. CONTRAST:  65m OMNIPAQUE IOHEXOL 300 MG/ML  SOLN COMPARISON:  Chest x-ray from same day. CT chest dated May 12, 2021. FINDINGS: Cardiovascular: Satisfactory opacification of the pulmonary arteries to the segmental level. No evidence of pulmonary embolism. Normal heart size. New small simple pericardial effusion. Mediastinum/Nodes: Prominent bilateral axillary lymph nodes are unchanged and likely reactive. No enlarged mediastinal or hilar lymph nodes. Thyroid gland, trachea, and esophagus demonstrate no significant findings. Lungs/Pleura: Minimal atelectasis at the lung bases. No focal consolidation, pleural effusion, or pneumothorax. Upper Abdomen: No acute abnormality. Musculoskeletal: No chest wall abnormality. No acute or significant osseous findings. Review of the MIP images confirms the above findings. IMPRESSION: 1. No evidence of pulmonary embolism. 2. New small simple pericardial effusion. Electronically Signed   By: WTitus DubinM.D.   On: 10/31/2021 16:54   DG Chest Port 1 View  Result Date: 10/31/2021 CLINICAL DATA:  Fever, cough, sore throat and headache for 5 days. EXAM: PORTABLE CHEST 1 VIEW COMPARISON:  Radiographs 05/12/2021 and 01/13/2021.  CT 05/12/2021. FINDINGS: 1513 hours. Interval increased heart size, more than expected solely on the basis of portable AP technique. The pulmonary vascularity is normal. There is hypoventilation of  both lung bases without confluent airspace opacity, significant pleural effusion or pneumothorax. The bones appear unremarkable. IMPRESSION: Interval increased size of the cardiac silhouette which could reflect cardiomegaly or a pericardial effusion. This may in part be technical given the AP technique and low lung volumes. Recommend PA and lateral views when the patient is able. Electronically Signed   By: WRichardean SaleM.D.   On: 10/31/2021 15:28     Labs:   Basic Metabolic Panel: Recent Labs  Lab 10/31/21 1526 11/01/21 0537 11/02/21 0052 11/03/21 0034 11/04/21 0127  NA 129* 136 134* 134* 134*  K 3.7 3.8 4.1 4.1 3.7  CL 99 107 105 100 100  CO2 23 24 20* 26 26  GLUCOSE 121* 124* 171* 126* 114*  BUN 8 8 8 13 13   CREATININE 0.90 0.86 0.81 0.75 0.77  CALCIUM 8.3* 8.2* 8.8* 9.1 9.1   GFR Estimated Creatinine Clearance: 107.8 mL/min (by C-G formula based on SCr of 0.77 mg/dL). Liver Function Tests: Recent Labs  Lab 10/31/21 1526 11/01/21 0537 11/02/21 0052 11/03/21 0034  AST 27 22 24 19   ALT 18 18 21 20   ALKPHOS 37* 36* 43 40  BILITOT 0.7 0.4 0.4 0.4  PROT 8.6* 7.9 7.9 8.1  ALBUMIN 3.0* 2.6* 2.5* 2.5*   No results for input(s): "LIPASE", "AMYLASE" in the last 168 hours. No results for input(s): "AMMONIA" in the last 168 hours. Coagulation profile No results for input(s): "INR", "PROTIME" in the last 168 hours.  CBC: Recent Labs  Lab 10/31/21 1526 11/01/21 0537 11/02/21 0052 11/03/21 0034 11/04/21 0127  WBC 8.3 7.8 10.2 11.5* 9.3  NEUTROABS 6.1  --   --   --   --   HGB 11.2* 11.1* 10.5* 10.4* 10.9*  HCT 33.4*  34.8* 32.1* 31.9* 33.4*  MCV 83.3 85.9 83.8 83.5 83.9  PLT 255 252 253 341 429*   Cardiac Enzymes: Recent Labs  Lab 10/31/21 1526  CKTOTAL 314*  314*  CKMB 2.5   BNP: Invalid input(s): "POCBNP" CBG: No results for input(s): "GLUCAP" in the last 168 hours. D-Dimer No results for input(s): "DDIMER" in the last 72 hours. Hgb A1c No results for  input(s): "HGBA1C" in the last 72 hours. Lipid Profile No results for input(s): "CHOL", "HDL", "LDLCALC", "TRIG", "CHOLHDL", "LDLDIRECT" in the last 72 hours. Thyroid function studies No results for input(s): "TSH", "T4TOTAL", "T3FREE", "THYROIDAB" in the last 72 hours.  Invalid input(s): "FREET3" Anemia work up No results for input(s): "VITAMINB12", "FOLATE", "FERRITIN", "TIBC", "IRON", "RETICCTPCT" in the last 72 hours. Microbiology Recent Results (from the past 240 hour(s))  Resp Panel by RT-PCR (Flu A&B, Covid) Anterior Nasal Swab     Status: None   Collection Time: 10/31/21  2:55 PM   Specimen: Anterior Nasal Swab  Result Value Ref Range Status   SARS Coronavirus 2 by RT PCR NEGATIVE NEGATIVE Final    Comment: (NOTE) SARS-CoV-2 target nucleic acids are NOT DETECTED.  The SARS-CoV-2 RNA is generally detectable in upper respiratory specimens during the acute phase of infection. The lowest concentration of SARS-CoV-2 viral copies this assay can detect is 138 copies/mL. A negative result does not preclude SARS-Cov-2 infection and should not be used as the sole basis for treatment or other patient management decisions. A negative result may occur with  improper specimen collection/handling, submission of specimen other than nasopharyngeal swab, presence of viral mutation(s) within the areas targeted by this assay, and inadequate number of viral copies(<138 copies/mL). A negative result must be combined with clinical observations, patient history, and epidemiological information. The expected result is Negative.  Fact Sheet for Patients:  EntrepreneurPulse.com.au  Fact Sheet for Healthcare Providers:  IncredibleEmployment.be  This test is no t yet approved or cleared by the [place] FDA and  has been authorized for detection and/or diagnosis of SARS-CoV-2 by FDA under an Emergency Use Authorization (EUA). This EUA will remain  in effect  (meaning this test can be used) for the duration of the COVID-19 declaration under Section 564(b)(1) of the Act, 21 U.S.C.section 360bbb-3(b)(1), unless the authorization is terminated  or revoked sooner.       Influenza A by PCR NEGATIVE NEGATIVE Final   Influenza B by PCR NEGATIVE NEGATIVE Final    Comment: (NOTE) The Xpert Xpress SARS-CoV-2/FLU/RSV plus assay is intended as an aid in the diagnosis of influenza from Nasopharyngeal swab specimens and should not be used as a sole basis for treatment. Nasal washings and aspirates are unacceptable for Xpert Xpress SARS-CoV-2/FLU/RSV testing.  Fact Sheet for Patients: EntrepreneurPulse.com.au  Fact Sheet for Healthcare Providers: IncredibleEmployment.be  This test is not yet approved or cleared by the [place] FDA and has been authorized for detection and/or diagnosis of SARS-CoV-2 by FDA under an Emergency Use Authorization (EUA). This EUA will remain in effect (meaning this test can be used) for the duration of the COVID-19 declaration under Section 564(b)(1) of the Act, 21 U.S.C. section 360bbb-3(b)(1), unless the authorization is terminated or revoked.  Performed at [facility], [address] [place], [place] [address]   Respiratory (~20 pathogens) panel by PCR     Status: None   Collection Time: 10/31/21  7:03 PM   Specimen: Nasopharyngeal Swab; Respiratory  Result Value Ref Range Status   Adenovirus NOT  DETECTED NOT DETECTED Final   Coronavirus 229E NOT DETECTED NOT DETECTED Final    Comment: (NOTE) The Coronavirus on the Respiratory Panel, DOES NOT test for the novel  Coronavirus (2019 nCoV)    Coronavirus HKU1 NOT DETECTED NOT DETECTED Final   Coronavirus NL63 NOT DETECTED NOT DETECTED Final   Coronavirus OC43 NOT DETECTED NOT DETECTED Final   Metapneumovirus NOT DETECTED NOT DETECTED Final   Rhinovirus / Enterovirus NOT DETECTED NOT DETECTED Final   Influenza  A NOT DETECTED NOT DETECTED Final   Influenza B NOT DETECTED NOT DETECTED Final   Parainfluenza Virus 1 NOT DETECTED NOT DETECTED Final   Parainfluenza Virus 2 NOT DETECTED NOT DETECTED Final   Parainfluenza Virus 3 NOT DETECTED NOT DETECTED Final   Parainfluenza Virus 4 NOT DETECTED NOT DETECTED Final   Respiratory Syncytial Virus NOT DETECTED NOT DETECTED Final   Bordetella pertussis NOT DETECTED NOT DETECTED Final   Bordetella Parapertussis NOT DETECTED NOT DETECTED Final   Chlamydophila pneumoniae NOT DETECTED NOT DETECTED Final   Mycoplasma pneumoniae NOT DETECTED NOT DETECTED Final    Comment: Performed at [facility], [place] [address] [place], [place] [address]  MRSA Next Gen by PCR, Nasal     Status: None   Collection Time: 10/31/21 10:41 PM   Specimen: Nasal Mucosa; Nasal Swab  Result Value Ref Range Status   MRSA by PCR Next Gen NOT DETECTED NOT DETECTED Final    Comment: (NOTE) The GeneXpert MRSA Assay (FDA approved for NASAL specimens only), is one component of a comprehensive MRSA colonization surveillance program. It is not intended to diagnose MRSA infection nor to guide or monitor treatment for MRSA infections. Test performance is not FDA approved in patients less than 17 years old. Performed at [facility], [place] [address] [place], [place] [address]      Discharge Instructions:   Discharge Instructions     Ambulatory referral to Rheumatology   Complete by: As directed    Call MD for:  difficulty breathing, headache or visual disturbances   Complete by: As directed    Call MD for:  severe uncontrolled pain   Complete by: As directed    Call MD for:  temperature >100.4   Complete by: As directed    Diet general   Complete by: As directed    Discharge instructions   Complete by: As directed    Follow-up with cardiology as outpatient as has been scheduled..  Take medications as prescribed.  Seek medical attention for worsening symptoms.  Referral  to rheumatology has been made internally.   Increase activity slowly   Complete by: As directed       Allergies as of 11/04/2021   No Known Allergies      Medication List     TAKE these medications    acetaminophen 325 MG tablet Commonly known as: TYLENOL Take 2 tablets (650 mg total) by mouth every 6 (six) hours as needed for up to 5 days for mild pain, fever or headache (or Fever >/= 101).   colchicine 0.6 MG tablet Take 1 tablet (0.6 mg total) by mouth 2 (two) times daily.   ibuprofen 200 MG tablet Commonly known as: Motrin IB Take 2 tablets (400 mg total) by mouth every 6 (six) hours as needed for up to 5 days for mild pain, moderate pain or fever.   omeprazole 20 MG capsule Commonly known as: PRILOSEC Take 1 capsule (20 mg total) by mouth daily for  14 days.   predniSONE 20 MG tablet Commonly known as: DELTASONE Take 2 tablets (40 mg total) by mouth daily with breakfast for 14 days. Start taking on: November 05, 2021        Follow-up Information     Almyra Deforest, [place] Follow up on 11/13/2021.   Specialties: Cardiology, Radiology Why: at 10:05 AM for your cardiology follow up Contact information: [address] [place] [address] [phone]                  Time coordinating discharge: 39 minutes  Signed:  Ajla Mcgeachy  [facility] 11/04/2021, 12:09 PM

## 2021-11-04 NOTE — Progress Notes (Signed)
Progress Note   Patient Name: Tiffany Herrera Date of Encounter: 11/04/2021  Primary Cardiologist: None   Subjective   Pt states she is doing much better than she was when she was admitted. Chest pain has decreased significantly, and she has been able to sleep properly. She states she is able to walk up and down the floor with little difficulty.   Inpatient Medications    Scheduled Meds:  colchicine  0.6 mg Oral BID   predniSONE  40 mg Oral Q breakfast   sodium chloride flush  3 mL Intravenous Q12H   Continuous Infusions:  PRN Meds: acetaminophen **OR** acetaminophen, ondansetron **OR** ondansetron (ZOFRAN) IV, senna-docusate   Vital Signs    Vitals:   11/04/21 0306 11/04/21 0530 11/04/21 0748 11/04/21 1115  BP: 111/89  107/71 100/66  Pulse: 93  (!) 108 83  Resp: 20 17 20 20   Temp: 98.3 F (36.8 C)  98.1 F (36.7 C) 98.2 F (36.8 C)  TempSrc: Oral  Oral Oral  SpO2: 97%  91% 92%  Weight: 78.6 kg     Height:        Intake/Output Summary (Last 24 hours) at 11/04/2021 1137 Last data filed at 11/04/2021 0800 Gross per 24 hour  Intake 1090 ml  Output --  Net 1090 ml      11/04/2021    3:06 AM 11/03/2021    5:00 AM 11/02/2021    4:54 AM  Last 3 Weights  Weight (lbs) 173 lb 4.5 oz 176 lb 174 lb 6.1 oz  Weight (kg) 78.6 kg 79.833 kg 79.1 kg      Telemetry   Sinus rhythm  ECG  Sinus rhythm  Physical Exam    Physical Exam Constitutional:      Appearance: Normal appearance. She is normal weight.  HENT:     Head: Normocephalic and atraumatic.  Cardiovascular:     Rate and Rhythm: Normal rate and regular rhythm.  Pulmonary:     Effort: Pulmonary effort is normal.     Breath sounds: Normal breath sounds.  Neurological:     Mental Status: She is alert.        Labs    Chemistry Recent Labs  Lab 11/01/21 0537 11/02/21 0052 11/03/21 0034 11/04/21 0127  NA 136 134* 134* 134*  K 3.8 4.1 4.1 3.7  CL 107 105 100 100  CO2 24 20* 26 26  GLUCOSE  124* 171* 126* 114*  BUN 8 8 13 13   CREATININE 0.86 0.81 0.75 0.77  CALCIUM 8.2* 8.8* 9.1 9.1  PROT 7.9 7.9 8.1  --   ALBUMIN 2.6* 2.5* 2.5*  --   AST 22 24 19   --   ALT 18 21 20   --   ALKPHOS 36* 43 40  --   BILITOT 0.4 0.4 0.4  --   GFRNONAA >60 >60 >60 >60  ANIONGAP 5 9 8 8      Hematology Recent Labs  Lab 11/02/21 0052 11/03/21 0034 11/04/21 0127  WBC 10.2 11.5* 9.3  RBC 3.83* 3.82* 3.98  HGB 10.5* 10.4* 10.9*  HCT 32.1* 31.9* 33.4*  MCV 83.8 83.5 83.9  MCH 27.4 27.2 27.4  MCHC 32.7 32.6 32.6  RDW 13.8 13.8 13.7  PLT 253 341 429*    Cardiac EnzymesNo results for input(s): "TROPONINI" in the last 168 hours. No results for input(s): "TROPIPOC" in the last 168 hours.   BNPNo results for input(s): "BNP", "PROBNP" in the last 168 hours.   DDimer No results for  input(s): "DDIMER" in the last 168 hours.   Radiology    ECHOCARDIOGRAM LIMITED  Result Date: 11/04/2021    ECHOCARDIOGRAM LIMITED REPORT   Patient Name:   Tiffany Herrera Date of Exam: 11/04/2021 Medical Rec #:  [id number]        Height:       66.0 in Accession #:    [id number]       Weight:       173.3 lb Date of Birth:  1990/12/24       BSA:          1.882 m Patient Age:    30 years         BP:           111/89 mmHg Patient Gender: F                HR:           90 bpm. Exam Location:  Inpatient Procedure: Limited Echo, Cardiac Doppler and Color Doppler Indications:    I31.3 Pericardial effusion (noninflammatory)  History:        Patient has prior history of Echocardiogram examinations, most                 recent 11/01/2021. Pericardial Disease. Pericarditis. Pericardial                 effusion.  Sonographer:    Sheralyn Boatman RDCS Referring Phys: Zoila Shutter, C IMPRESSIONS  1. Left ventricular ejection fraction, by estimation, is 60 to 65%. The left ventricle has normal function. The left ventricle has no regional wall motion abnormalities.  2. Right ventricular systolic function is normal. The right ventricular size  is normal.  3. A small pericardial effusion is present. The pericardial effusion is circumferential. There is no evidence of cardiac tamponade.  4. The mitral valve is normal in structure. Trivial mitral valve regurgitation.  5. The aortic valve was not well visualized.  6. The inferior vena cava is normal in size with greater than 50% respiratory variability, suggesting right atrial pressure of 3 mmHg. Comparison(s): Compared to prior study on 11/01/21, there is no significant change. Pericardial effusion remains small with no evidence of tamponade. FINDINGS  Left Ventricle: Left ventricular ejection fraction, by estimation, is 60 to 65%. The left ventricle has normal function. The left ventricle has no regional wall motion abnormalities. The left ventricular internal cavity size was normal in size. There is  no left ventricular hypertrophy. Right Ventricle: The right ventricular size is normal. No increase in right ventricular wall thickness. Right ventricular systolic function is normal. Left Atrium: Left atrial size was normal in size. Right Atrium: Right atrial size was normal in size. Pericardium: A small pericardial effusion is present. The pericardial effusion is circumferential. There is no evidence of cardiac tamponade. Mitral Valve: The mitral valve is normal in structure. Trivial mitral valve regurgitation. Tricuspid Valve: The tricuspid valve is normal in structure. Tricuspid valve regurgitation is trivial. Aortic Valve: The aortic valve was not well visualized. Pulmonic Valve: The pulmonic valve was normal in structure. Pulmonic valve regurgitation is trivial. Venous: The inferior vena cava is normal in size with greater than 50% respiratory variability, suggesting right atrial pressure of 3 mmHg. Additional Comments: There is a small pleural effusion in the left lateral region. LEFT VENTRICLE PLAX 2D LVIDd:         4.70 cm LVIDs:         2.80 cm LV PW:  1.00 cm LV IVS:        0.90 cm  IVC IVC  diam: 1.50 cm Laurance Flatten MD Electronically signed by Laurance Flatten MD Signature Date/Time: 11/04/2021/10:51:31 AM    Final     Cardiac Studies   See Above  Patient Profile     32 y.o. female w/ no significant past medical history presenting with chest pain post fever.    Assessment & Plan   Pericarditis w/pericardial effusion  Pt appears to be much better symptomatically. Her chest pain has decreased significantly, and she is able to tolerate ADLs/IADLs with no difficulty.  Final recommendations include prednisone 40mg  daily for at least two weeks, and continue colchicine .6mg  BID for three months. Highly recommend referral to rheumatology for further evaluation of recurrent pericarditis. ANA and IFA titers still pending. She has an appointment with at [facility] on 11/13/21     [facility] will sign off.   Medication Recommendations:  Continue Prednisone 40 mg for 2 weeks, Colchicine .6mg  for three months. Other recommendations (labs, testing, etc):  Titers for ANA and IFA still pending Follow up as an outpatient:  ANA and IFA titers, Dr.Hao [facility] at [facility] on 11/13/21  For questions or updates, please contact [facility] Please consult [email or web address] for contact info under        Signed, Lisabeth Devoid, MD  11/04/2021, 11:37 AM

## 2021-11-05 ENCOUNTER — Telehealth: Payer: Self-pay

## 2021-11-05 LAB — COMPLEMENT, TOTAL: Compl, Total (CH50): 58 U/mL (ref >41)

## 2021-11-05 NOTE — Telephone Encounter (Signed)
Transition Care Management Follow-up Telephone Call Date of discharge and from where: 11/04/2021. [facility] How have you been since you were released from the hospital? She stated she is feeling " pretty fine." Any questions or concerns? No  Items Reviewed: Did the pt receive and understand the discharge instructions provided? Yes  Medications obtained and verified? Yes - she said she has all of her medications  Other? No  Any new allergies since your discharge? No  Dietary orders reviewed? No Do you have support at home? Yes   Home Care and Equipment/Supplies: Were home health services ordered? no If so, what is the name of the agency? N/a  Has the agency set up a time to come to the patient's home? not applicable Were any new equipment or medical supplies ordered?  No What is the name of the medical supply agency? N/a Were you able to get the supplies/equipment? not applicable Do you have any questions related to the use of the equipment or supplies? No  Functional Questionnaire: (I = Independent and D = Dependent) ADLs: independent   Follow up appointments reviewed:  PCP Hospital f/u appt confirmed? Yes  Scheduled to see Dr Andrey Campanile- 11/20/2021.   Specialist Hospital f/u appt confirmed? Yes  Scheduled to see cardiology - 11/13/2021.   Are transportation arrangements needed? No  If their condition worsens, is the pt aware to call PCP or go to the Emergency Dept.? Yes Was the patient provided with contact information for the PCP's office or ED? Yes Was to pt encouraged to call back with questions or concerns? Yes

## 2021-11-13 ENCOUNTER — Ambulatory Visit
Admission: RE | Admit: 2021-11-13 | Discharge: 2021-11-13 | Disposition: A | Discharge status: Home / Self Care | Payer: Medicaid Other | Source: Ambulatory Visit | Attending: Nurse Practitioner | Admitting: Nurse Practitioner

## 2021-11-13 ENCOUNTER — Ambulatory Visit: Payer: Medicaid Other | Attending: Physician Assistant | Admitting: Nurse Practitioner

## 2021-11-13 ENCOUNTER — Encounter: Payer: Self-pay | Admitting: Physician Assistant

## 2021-11-13 VITALS — BP 118/70 | HR 86 | Ht 66.0 in | Wt 179.4 lb

## 2021-11-13 DIAGNOSIS — I3139 Other pericardial effusion (noninflammatory): Secondary | ICD-10-CM

## 2021-11-13 DIAGNOSIS — I300 Acute nonspecific idiopathic pericarditis: Secondary | ICD-10-CM | POA: Diagnosis not present

## 2021-11-13 DIAGNOSIS — J90 Pleural effusion, not elsewhere classified: Secondary | ICD-10-CM | POA: Diagnosis not present

## 2021-11-13 NOTE — Patient Instructions (Signed)
Medication Instructions:  Your physician recommends that you continue on your current medications as directed. Please refer to the Current Medication list given to you today.  *If you need a refill on your cardiac medications before your next appointment, please call your pharmacy*  Lab Work: Your physician recommends that you return for lab work TODAY:  BMP CBC CRP ESR  If you have labs (blood work) drawn today and your tests are completely normal, you will receive your results only by: MyChart Message (if you have MyChart) OR A paper copy in the mail If you have any lab test that is abnormal or we need to change your treatment, we will call you to review the results.  Testing/Procedures: Your physician has requested that you have an echocardiogram. Echocardiography is a painless test that uses sound waves to create images of your heart. It provides your doctor with information about the size and shape of your heart and how well your heart's chambers and valves are working. This procedure takes approximately one hour. There are no restrictions for this procedure.  A chest x-ray takes a picture of the organs and structures inside the chest, including the heart, lungs, and blood vessels. This test can show several things, including, whether the heart is enlarges; whether fluid is building up in the lungs; and whether pacemaker / defibrillator leads are still in place. This test is performed at [facility] [address] [place] [address]  Follow-Up: At [facility], you and your health needs are our priority.  As part of our continuing mission to provide you with exceptional heart care, we have created designated Provider Care Teams.  These Care Teams include your primary Cardiologist (physician) and Advanced Practice Providers (APPs -  Physician Assistants and Nurse Practitioners) who all work together to provide you with the care you need, when you need it.  Your next  appointment:   1 month(s)  The format for your next appointment:   In Person  Provider:   Finis Bud, NP on Juvent[address] Slovak, NP 12/13/21  Other Instructions  Important Information About Sugar

## 2021-11-13 NOTE — Progress Notes (Addendum)
Cardiology Office Note:    Date:  11/13/2021   ID:  Tiffany Herrera, DOB 05-10-1990, MRN [id number]  PCP:  Dorna Mai, [facility] Cardiologist:  Pixie Casino, MD     Referring MD: Dorna Mai, MD   CC: Right sided pain  History of Present Illness:    Tiffany Herrera is a 31 y.o. female with a hx of the following:  Pericarditis Pericardial effusion Pleural effusion  Patient is a 31 y.o. female who is an overall healthy adult without a significant past medical history that presented to the emergency department with chief complaint of chest pain on October 31, 2021.  Stated that she developed sore throat, fevers, aches and malaise, and loss of appetite 4 days ago.  She said that her symptoms started to improve over the past day but then she developed new chest pain that brought her to the ED. chest pain was described as centrally located, sharp, worse with laying down and better with leaning forward or with sitting up.  Associated symptom of nonproductive cough that improved and denied any tenderness, lightheadedness, shortness of breath, or leg swelling.  In the emergency department she was running a fever, tachycardic, and hypotensive.  CTA of chest negative for PE, new small simple pericardial effusion noted.  Troponin slightly elevated at 38-30.  Chest x-ray revealed increased cardiac silhouette.   Cardiology was consulted.  Echocardiogram ordered revealed LVEF 65 to 61%, grade 1 diastolic dysfunction noted, small pericardial effusion was noted to be circumferential, no evidence of cardiac tamponade.  No valvular abnormalities noted.  Normal aortic root.  Otherwise, normal echocardiogram.  Repeat limited echocardiogram done 3 days later revealed LVEF 60 to 65%, a small pericardial effusion that was circumferential, no evidence of cardiac tamponade, trivial mitral valve regurgitation noted trivial tricuspid valve regurgitation, small pleural effusion noted  in the left lateral region.  Elevated CRP and ESR, it was noted that in the past she had a previous episode of pericarditis so there was a concern for possibility of an autoimmune disease. She was treated with prednisone, colchicine and ibuprofen for acute viral pericarditis.  Discharge was planned for continuing colchicine for 3 months and prednisone for at least 2 weeks until cardiology follow-up.  It was recommended she might need slow tapering of prednisone depending on her response.  COVID and influenza were negative.  Respiratory viral panel negative.  ANA pending, C4-5, C3 complement is WNL, double-stranded DNA antibody negative.   Today she presents for follow-up.  Complains of right-sided pain under right breast tissue and right lateral chest wall that started yesterday, was doing well until this happened.  She also noticed it today while getting up this morning.  Unable to describe what it feels like, but says it hurts when she lays down on that side or when taking a deep breath.  Says when she coughs it is tight in that area.  Rates the pain as 8 out of 10 in intensity.  She says she took Aleve this morning, but did not seem to relieve the pain.  States she is still on prednisone and will most likely finish this medication this week, wants to know if she needs to take more medication with her new symptom.  She says this pain feels a little similar to previous pericarditis.  Does states she experiences some shortness of breath with certain movements or with laying down-noticed this yesterday.  Works as Animal nutritionist at [employer].  Denies any other  questions or concerns today.   Current Medications: Current Meds  Medication Sig   colchicine 0.6 MG tablet Take 1 tablet (0.6 mg total) by mouth 2 (two) times daily.   naproxen sodium (ALEVE) 220 MG tablet Take 220 mg by mouth 2 (two) times daily with a meal.   omeprazole (PRILOSEC) 20 MG capsule Take 1 capsule (20 mg total) by mouth daily for 14  days.   predniSONE (DELTASONE) 20 MG tablet Take 2 tablets (40 mg total) by mouth daily with breakfast for 14 days.     Allergies:   Patient has no known allergies.   Social History   Socioeconomic History   Marital status: Single    Spouse name: Not on file   Number of children: Not on file   Years of education: Not on file   Highest education level: Not on file  Occupational History   Not on file  Tobacco Use   Smoking status: Never   Smokeless tobacco: Never  Vaping Use   Vaping Use: Never used  Substance and Sexual Activity   Alcohol use: No   Drug use: No   Sexual activity: Yes    Birth control/protection: None  Other Topics Concern   Not on file  Social History Narrative   Not on file   Social Determinants of Health   Financial Resource Strain: Not on file  Food Insecurity: Not on file  Transportation Needs: Not on file  Physical Activity: Not on file  Stress: Not on file  Social Connections: Not on file     Family History: The patient's family history includes Diabetes in her maternal aunt and maternal grandmother; Healthy in her father and mother; Hypertension in her cousin and maternal aunt.  ROS:   Review of Systems  Constitutional: Negative.   HENT: Negative.    Eyes: Negative.   Respiratory:  Positive for cough and shortness of breath. Negative for hemoptysis, sputum production and wheezing.        Pain with deep inspiration, some shortness of breath (See HPI).   Cardiovascular:  Positive for chest pain. Negative for palpitations, orthopnea, claudication, leg swelling and PND.       See HPI.  Gastrointestinal: Negative.   Genitourinary: Negative.   Musculoskeletal: Negative.   Skin: Negative.   Neurological: Negative.   Endo/Heme/Allergies: Negative.   Psychiatric/Behavioral: Negative.      Please see the history of present illness.    All other systems reviewed and are negative.  EKGs/Labs/Other Studies Reviewed:    The following studies  were reviewed today:   EKG:  EKG is not ordered today.   2D limited echocardiogram on November 04, 2021: 1. Left ventricular ejection fraction, by estimation, is 60 to 65%. The  left ventricle has normal function. The left ventricle has no regional  wall motion abnormalities.   2. Right ventricular systolic function is normal. The right ventricular  size is normal.   3. A small pericardial effusion is present. The pericardial effusion is  circumferential. There is no evidence of cardiac tamponade.   4. The mitral valve is normal in structure. Trivial mitral valve  regurgitation.   5. The aortic valve was not well visualized.   6. The inferior vena cava is normal in size with greater than 50%  respiratory variability, suggesting right atrial pressure of 3 mmHg.   Comparison(s): Compared to prior study on 11/01/21, there is no  significant change. Pericardial effusion remains small with no evidence of  tamponade.  FINDINGS   Left Ventricle: Left ventricular ejection fraction, by estimation, is 60  to 65%. The left ventricle has normal function. The left ventricle has no  regional wall motion abnormalities. The left ventricular internal cavity  size was normal in size. There is   no left ventricular hypertrophy.   Right Ventricle: The right ventricular size is normal. No increase in  right ventricular wall thickness. Right ventricular systolic function is  normal.   Left Atrium: Left atrial size was normal in size.   Right Atrium: Right atrial size was normal in size.   Pericardium: A small pericardial effusion is present. The pericardial  effusion is circumferential. There is no evidence of cardiac tamponade.   Mitral Valve: The mitral valve is normal in structure. Trivial mitral  valve regurgitation.   Tricuspid Valve: The tricuspid valve is normal in structure. Tricuspid  valve regurgitation is trivial.   Aortic Valve: The aortic valve was not well visualized.    Pulmonic Valve: The pulmonic valve was normal in structure. Pulmonic valve  regurgitation is trivial.   Venous: The inferior vena cava is normal in size with greater than 50%  respiratory variability, suggesting right atrial pressure of 3 mmHg.   Additional Comments: There is a small pleural effusion in the left lateral  region.  2D complete echocardiogram on November 01, 2021:  1. Left ventricular ejection fraction, by estimation, is 65 to 70%. Left  ventricular ejection fraction by PLAX is 68 %. The left ventricle has  normal function. The left ventricle has no regional wall motion  abnormalities. Left ventricular diastolic  parameters are consistent with Grade I diastolic dysfunction (impaired  relaxation).   2. Right ventricular systolic function is normal. The right ventricular  size is normal.   3. A small pericardial effusion is present. The pericardial effusion is  circumferential. There is no evidence of cardiac tamponade.   4. The mitral valve is normal in structure. No evidence of mitral valve  regurgitation. No evidence of mitral stenosis.   5. The aortic valve is tricuspid. Aortic valve regurgitation is not  visualized. No aortic stenosis is present.   6. The inferior vena cava is normal in size with greater than 50%  respiratory variability, suggesting right atrial pressure of 3 mmHg.   FINDINGS   Left Ventricle: Left ventricular ejection fraction, by estimation, is 65  to 70%. Left ventricular ejection fraction by PLAX is 68 %. The left  ventricle has normal function. The left ventricle has no regional wall  motion abnormalities. The left  ventricular internal cavity size was normal in size. There is no left  ventricular hypertrophy. Left ventricular diastolic parameters are  consistent with Grade I diastolic dysfunction (impaired relaxation).   Right Ventricle: The right ventricular size is normal. No increase in  right ventricular wall thickness. Right  ventricular systolic function is  normal.   Left Atrium: Left atrial size was normal in size.   Right Atrium: Right atrial size was normal in size.   Pericardium: A small pericardial effusion is present. The pericardial  effusion is circumferential. There is no evidence of cardiac tamponade.   Mitral Valve: The mitral valve is normal in structure. No evidence of  mitral valve regurgitation. No evidence of mitral valve stenosis.   Tricuspid Valve: The tricuspid valve is normal in structure. Tricuspid  valve regurgitation is not demonstrated. No evidence of tricuspid  stenosis.   Aortic Valve: The aortic valve is tricuspid. Aortic valve regurgitation is  not visualized.  No aortic stenosis is present.   Pulmonic Valve: The pulmonic valve was normal in structure. Pulmonic valve  regurgitation is not visualized. No evidence of pulmonic stenosis.   Aorta: The aortic root is normal in size and structure.   Venous: The inferior vena cava is normal in size with greater than 50%  respiratory variability, suggesting right atrial pressure of 3 mmHg.   IAS/Shunts: No atrial level shunt detected by color flow Doppler.   1 view chest x-ray on November 01, 2021: Impression: 1.  Persistent enlargement of the Cardiopericardial silhouette, which could suggest cardiomegaly and/or presence of pericardial effusion. 2.  Trace left pleural effusion.  CTA of the chest on October 31, 2021: Cardiovascular: Satisfactory opacification of the pulmonary arteries to the segmental level. No evidence of pulmonary embolism. Normal heart size. New small simple pericardial effusion.   Mediastinum/Nodes: Prominent bilateral axillary lymph nodes are unchanged and likely reactive. No enlarged mediastinal or hilar lymph nodes. Thyroid gland, trachea, and esophagus demonstrate no significant findings.   Lungs/Pleura: Minimal atelectasis at the lung bases. No focal consolidation, pleural effusion, or pneumothorax.    Upper Abdomen: No acute abnormality.   Musculoskeletal: No chest wall abnormality. No acute or significant osseous findings.   Review of the MIP images confirms the above findings.   IMPRESSION: 1. No evidence of pulmonary embolism. 2. New small simple pericardial effusion.    Recent Labs: 05/28/2021: TSH 0.874 11/03/2021: ALT 20 11/04/2021: BUN 13; Creatinine, Ser 0.77; Hemoglobin 10.9; Platelets 429; Potassium 3.7; Sodium 134  Recent Lipid Panel    Component Value Date/Time   CHOL 124 05/28/2021 1515   TRIG 156 (H) 05/28/2021 1515   HDL 28 (L) 05/28/2021 1515   CHOLHDL 4.4 05/28/2021 1515   LDLCALC 69 05/28/2021 1515     Physical Exam:    VS:  BP 118/70   Pulse 86   Ht 5' 6"  (1.676 m)   Wt 179 lb 6.4 oz (81.4 kg)   LMP 10/28/2021 (Exact Date)   SpO2 99%   BMI 28.96 kg/m     Wt Readings from Last 3 Encounters:  11/13/21 179 lb 6.4 oz (81.4 kg)  11/04/21 173 lb 4.5 oz (78.6 kg)  05/28/21 185 lb (83.9 kg)     GEN: Well nourished, well developed in no acute distress HEENT: Normal NECK: No JVD; No carotid bruits CARDIAC: RRR, no murmurs, rubs, gallops; 2+ peripheral pulses throughout, strong and equal bilaterally RESPIRATORY:  Clear to auscultation without rales, wheezing or rhonchi  ABDOMEN: Soft, non-tender, non-distended MUSCULOSKELETAL:  No edema; No deformity  SKIN: Warm and dry NEUROLOGIC:  Alert and oriented x 3 PSYCHIATRIC:  Normal affect   ASSESSMENT:    1. Acute idiopathic pericarditis   2. Pericardial effusion   3. Pleural effusion    PLAN:    In order of problems listed above:  Acute idiopathic pericarditis - recurrent, not progressing Has had recent episode of right-sided chest pain since hospital d/c, she said previously during hospitalization chest pain was left-sided.  Does admit to pain with deep inspiration and with laying down.  We will obtain CRP and ESR today.  We will add Aleve to her medication list and we will continue to have her  take this around-the-clock.  If ESR and CRP are elevated, plan to extend prednisone regimen.  Discussed ED precautions and she verbalizes understanding.  Continue to follow-up with PCP and rheumatology, referral placed during last hospitalization.  Was recommended by hospital team to obtain CBC and BMET  at office follow-up and will arrange this to be drawn today.   2.  Pericardial effusion - recent, stable Repeat limited echocardiogram on November 04, 2021 continue to reveal small pericardial effusion, with no evidence of tamponade, similar to prior.  No evidence of Beck's triad during exam.  Will obtain 2D limited echo in 1 month prior to follow-up visit.  Discussed ED precautions and she verbalizes understanding.    3. Pleural effusion - recent, stable Recent trace left pleural effusion noted on 1 view chest x-ray on November 01, 2021.  Does admit to pain with deep inspiration.  Will obtain 2 view chest x-ray at this time to r/o any acute pulmonology abnormality.   4. Disposition: Follow-up with me or Laurann Montana, NP in 1 month or sooner if anything changes.   Medication Adjustments/Labs and Tests Ordered: Current medicines are reviewed at length with the patient today.  Concerns regarding medicines are outlined above.  Orders Placed This Encounter  Procedures   DG Chest 2 View   Basic metabolic panel   C-reactive protein   Sedimentation rate   CBC   ECHOCARDIOGRAM LIMITED   No orders of the defined types were placed in this encounter.   Patient Instructions  Medication Instructions:  Your physician recommends that you continue on your current medications as directed. Please refer to the Current Medication list given to you today.  *If you need a refill on your cardiac medications before your next appointment, please call your pharmacy*  Lab Work: Your physician recommends that you return for lab work TODAY:  BMP CBC CRP ESR  If you have labs (blood work) drawn today and your  tests are completely normal, you will receive your results only by: MyChart Message (if you have MyChart) OR A paper copy in the mail If you have any lab test that is abnormal or we need to change your treatment, we will call you to review the results.  Testing/Procedures: Your physician has requested that you have an echocardiogram. Echocardiography is a painless test that uses sound waves to create images of your heart. It provides your doctor with information about the size and shape of your heart and how well your heart's chambers and valves are working. This procedure takes approximately one hour. There are no restrictions for this procedure.  A chest x-ray takes a picture of the organs and structures inside the chest, including the heart, lungs, and blood vessels. This test can show several things, including, whether the heart is enlarges; whether fluid is building up in the lungs; and whether pacemaker / defibrillator leads are still in place. This test is performed at [facility] [address] [place] [address]  Follow-Up: At [facility], you and your health needs are our priority.  As part of our continuing mission to provide you with exceptional heart care, we have created designated Provider Care Teams.  These Care Teams include your primary Cardiologist (physician) and Advanced Practice Providers (APPs -  Physician Assistants and Nurse Practitioners) who all work together to provide you with the care you need, when you need it.  Your next appointment:   1 month(s)  The format for your next appointment:   In Person  Provider:   Finis Bud, NP on Juventino Slovak, NP 12/13/21  Other Instructions  Important Information About Sugar         Signed, Finis Bud, NP  11/13/2021 8:43 PM    [place]

## 2021-11-14 ENCOUNTER — Telehealth: Payer: Self-pay | Admitting: Nurse Practitioner

## 2021-11-14 ENCOUNTER — Telehealth: Payer: Self-pay | Admitting: *Deleted

## 2021-11-14 DIAGNOSIS — Z79899 Other long term (current) drug therapy: Secondary | ICD-10-CM

## 2021-11-14 LAB — C-REACTIVE PROTEIN: CRP: 1 mg/L (ref 0–10)

## 2021-11-14 LAB — BASIC METABOLIC PANEL
BUN/Creatinine Ratio: 20 (ref 9–23)
BUN: 15 mg/dL (ref 6–20)
CO2: 24 mmol/L (ref 20–29)
Calcium: 9.2 mg/dL (ref 8.7–10.2)
Chloride: 102 mmol/L (ref 96–106)
Creatinine, Ser: 0.74 mg/dL (ref 0.57–1.00)
Glucose: 119 mg/dL — ABNORMAL HIGH (ref 70–99)
Potassium: 4.5 mmol/L (ref 3.5–5.2)
Sodium: 141 mmol/L (ref 134–144)
eGFR: 111 mL/min/{1.73_m2} (ref >59)

## 2021-11-14 LAB — CBC
Hematocrit: 37.9 % (ref 34.0–46.6)
Hemoglobin: 12.1 g/dL (ref 11.1–15.9)
MCH: 27.3 pg (ref 26.6–33.0)
MCHC: 31.9 g/dL (ref 31.5–35.7)
MCV: 85 fL (ref 79–97)
Platelets: 494 10*3/uL — ABNORMAL HIGH (ref 150–450)
RBC: 4.44 x10E6/uL (ref 3.77–5.28)
RDW: 13.9 % (ref 11.7–15.4)
WBC: 9.2 10*3/uL (ref 3.4–10.8)

## 2021-11-14 LAB — SEDIMENTATION RATE: Sed Rate: 78 mm/hr — ABNORMAL HIGH (ref 0–32)

## 2021-11-14 NOTE — Telephone Encounter (Signed)
Patient states she was supposed to be referred to a rheumatologist. She would like to know the name of the office and their number so she can contact them to schedule.

## 2021-11-14 NOTE — Telephone Encounter (Signed)
I have already spoken to [employer]. She was inquiring about the referral made to rheumatology because she could not see that it was sent anywhere. I explained to her that I do not make referrals to specialists. I am not a hospital case manager.  She appreciated the information and said she would continue to determine if the referral has been sent and if so, where it has been sent to.

## 2021-11-14 NOTE — Telephone Encounter (Signed)
Patient request call  from Robyne Peers

## 2021-11-15 ENCOUNTER — Other Ambulatory Visit: Payer: Self-pay | Admitting: Family Medicine

## 2021-11-15 DIAGNOSIS — M2550 Pain in unspecified joint: Secondary | ICD-10-CM

## 2021-11-15 NOTE — Telephone Encounter (Signed)
Left detailed message as noted for patient (DPR) Rheumatology referral placed while pt was in the hospital, call back w/any questions. Hopefully someone will be calling to schedule soon.

## 2021-11-15 NOTE — Telephone Encounter (Signed)
Mag order entered. Spoke with [facility], she will add Mag to current labs so pt does not have to come in again.

## 2021-11-15 NOTE — Telephone Encounter (Addendum)
noted 

## 2021-11-15 NOTE — Telephone Encounter (Signed)
noted 

## 2021-11-15 NOTE — Addendum Note (Signed)
Addended by: Alyson Ingles on: 11/15/2021 10:22 AM   Modules accepted: Orders

## 2021-11-18 LAB — SPECIMEN STATUS REPORT

## 2021-11-18 LAB — MAGNESIUM: Magnesium: 2.2 mg/dL (ref 1.6–2.3)

## 2021-11-20 ENCOUNTER — Encounter: Payer: Self-pay | Admitting: Family Medicine

## 2021-11-20 ENCOUNTER — Ambulatory Visit: Payer: Medicaid Other | Admitting: Family Medicine

## 2021-11-20 VITALS — BP 97/68 | HR 95 | Temp 98.7°F | Resp 16 | Ht 66.0 in | Wt 182.2 lb

## 2021-11-20 DIAGNOSIS — I300 Acute nonspecific idiopathic pericarditis: Secondary | ICD-10-CM

## 2021-11-20 DIAGNOSIS — D8989 Other specified disorders involving the immune mechanism, not elsewhere classified: Secondary | ICD-10-CM

## 2021-11-20 NOTE — Progress Notes (Signed)
Patient is here with HFU. Patient said that she had right side under rib pain/ SOB that she went to the ER for. Patient was given a dose pack, pain went away. Patient said after medication was done symptoms came back 8/10

## 2021-11-21 ENCOUNTER — Encounter: Payer: Self-pay | Admitting: Family Medicine

## 2021-11-21 NOTE — Progress Notes (Signed)
Established Patient Office Visit  Subjective    Patient ID: Tiffany Herrera, female    DOB: 02/26/1991  Age: 31 y.o. MRN: [id number]  CC:  Chief Complaint  Patient presents with   Follow-up    HFU    HPI Demaria Deeney presents to establish care with a new provider as well as follow up of recent hospitalization for pericarditis. Patient reports some improvement of sx.    Outpatient Encounter Medications as of 11/20/2021  Medication Sig   colchicine 0.6 MG tablet Take 1 tablet (0.6 mg total) by mouth 2 (two) times daily.   naproxen sodium (ALEVE) 220 MG tablet Take 220 mg by mouth 2 (two) times daily with a meal.   [DISCONTINUED] albuterol (PROVENTIL HFA;VENTOLIN HFA) 108 (90 Base) MCG/ACT inhaler Inhale 2 puffs into the lungs every 4 (four) hours as needed for shortness of breath (cough).   [DISCONTINUED] omeprazole (PRILOSEC) 20 MG capsule Take 1 capsule (20 mg total) by mouth daily for 14 days.   No facility-administered encounter medications on file as of 11/20/2021.    No past medical history on file.  No past surgical history on file.  Family History  Problem Relation Age of Onset   Healthy Mother    Healthy Father    Diabetes Maternal Grandmother    Hypertension Maternal Aunt    Diabetes Maternal Aunt    Hypertension Cousin     Social History   Socioeconomic History   Marital status: Single    Spouse name: Not on file   Number of children: Not on file   Years of education: Not on file   Highest education level: Not on file  Occupational History   Not on file  Tobacco Use   Smoking status: Never   Smokeless tobacco: Never  Vaping Use   Vaping Use: Never used  Substance and Sexual Activity   Alcohol use: No   Drug use: No   Sexual activity: Yes    Birth control/protection: None  Other Topics Concern   Not on file  Social History Narrative   Not on file   Social Determinants of Health   Financial Resource Strain: Not on file  Food Insecurity:  Not on file  Transportation Needs: Not on file  Physical Activity: Not on file  Stress: Not on file  Social Connections: Not on file  Intimate Partner Violence: Not on file    Review of Systems  All other systems reviewed and are negative.       Objective    BP 97/68   Pulse 95   Temp 98.7 F (37.1 C) (Oral)   Resp 16   Ht 5\' 6"  (1.676 m)   Wt 182 lb 3.2 oz (82.6 kg)   LMP 10/28/2021 (Exact Date)   SpO2 97%   BMI 29.41 kg/m   Physical Exam Vitals and nursing note reviewed.  Constitutional:      General: She is not in acute distress. Cardiovascular:     Rate and Rhythm: Normal rate and regular rhythm.  Pulmonary:     Effort: Pulmonary effort is normal.     Breath sounds: Normal breath sounds.  Abdominal:     Palpations: Abdomen is soft.     Tenderness: There is no abdominal tenderness.  Neurological:     General: No focal deficit present.     Mental Status: She is alert and oriented to person, place, and time.         Assessment & Plan:   1.  Acute idiopathic pericarditis Improved. Management per consultant  2. 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 Awaiting consultant eval for further eval/mgt     No follow-ups on file.   Tommie Raymond, MD

## 2021-11-22 ENCOUNTER — Other Ambulatory Visit: Payer: Self-pay

## 2021-11-22 ENCOUNTER — Encounter (HOSPITAL_BASED_OUTPATIENT_CLINIC_OR_DEPARTMENT_OTHER): Payer: Self-pay | Admitting: *Deleted

## 2021-11-22 ENCOUNTER — Emergency Department (HOSPITAL_BASED_OUTPATIENT_CLINIC_OR_DEPARTMENT_OTHER)
Admission: EM | Admit: 2021-11-22 | Discharge: 2021-11-23 | Disposition: A | Discharge status: Home / Self Care | Payer: Medicaid Other | Attending: Emergency Medicine | Admitting: Emergency Medicine

## 2021-11-22 ENCOUNTER — Emergency Department (HOSPITAL_BASED_OUTPATIENT_CLINIC_OR_DEPARTMENT_OTHER): Payer: Medicaid Other

## 2021-11-22 DIAGNOSIS — N9489 Other specified conditions associated with female genital organs and menstrual cycle: Secondary | ICD-10-CM | POA: Diagnosis not present

## 2021-11-22 DIAGNOSIS — R0789 Other chest pain: Secondary | ICD-10-CM | POA: Diagnosis not present

## 2021-11-22 DIAGNOSIS — J90 Pleural effusion, not elsewhere classified: Secondary | ICD-10-CM

## 2021-11-22 DIAGNOSIS — R0781 Pleurodynia: Secondary | ICD-10-CM | POA: Diagnosis present

## 2021-11-22 LAB — CBC
HCT: 37.8 % (ref 36.0–46.0)
Hemoglobin: 12.2 g/dL (ref 12.0–15.0)
MCH: 27.5 pg (ref 26.0–34.0)
MCHC: 32.3 g/dL (ref 30.0–36.0)
MCV: 85.3 fL (ref 80.0–100.0)
Platelets: 284 10*3/uL (ref 150–400)
RBC: 4.43 MIL/uL (ref 3.87–5.11)
RDW: 14.7 % (ref 11.5–15.5)
WBC: 3.7 10*3/uL — ABNORMAL LOW (ref 4.0–10.5)
nRBC: 0 % (ref 0.0–0.2)

## 2021-11-22 LAB — MAGNESIUM: Magnesium: 1.7 mg/dL (ref 1.7–2.4)

## 2021-11-22 LAB — BASIC METABOLIC PANEL
Anion gap: 8 (ref 5–15)
BUN: 16 mg/dL (ref 6–20)
CO2: 26 mmol/L (ref 22–32)
Calcium: 8.9 mg/dL (ref 8.9–10.3)
Chloride: 102 mmol/L (ref 98–111)
Creatinine, Ser: 0.81 mg/dL (ref 0.44–1.00)
GFR, Estimated: >60 mL/min (ref >60)
Glucose, Bld: 95 mg/dL (ref 70–99)
Potassium: 4.1 mmol/L (ref 3.5–5.1)
Sodium: 136 mmol/L (ref 135–145)

## 2021-11-22 LAB — HCG, SERUM, QUALITATIVE: Preg, Serum: NEGATIVE

## 2021-11-22 LAB — TROPONIN I (HIGH SENSITIVITY)
Troponin I (High Sensitivity): 16 ng/L (ref <18)
Troponin I (High Sensitivity): 13 ng/L (ref <18)

## 2021-11-22 LAB — SEDIMENTATION RATE: Sed Rate: 30 mm/hr — ABNORMAL HIGH (ref 0–22)

## 2021-11-22 MED ORDER — LIDOCAINE 5 % EX PTCH
1.0000 | MEDICATED_PATCH | CUTANEOUS | Status: DC
  Administered 2021-11-22: 1 via TRANSDERMAL
  Filled 2021-11-22: qty 1

## 2021-11-22 MED ORDER — KETOROLAC TROMETHAMINE 15 MG/ML IJ SOLN
15.0000 mg | Freq: Once | INTRAMUSCULAR | Status: AC
  Administered 2021-11-22: 15 mg via INTRAVENOUS
  Filled 2021-11-22: qty 1

## 2021-11-22 MED ORDER — ACETAMINOPHEN 325 MG PO TABS
650.0000 mg | ORAL_TABLET | Freq: Once | ORAL | Status: AC
  Administered 2021-11-22: 650 mg via ORAL
  Filled 2021-11-22: qty 2

## 2021-11-22 NOTE — ED Notes (Signed)
Please note that VS were obtained in triage during triage evaluation and were WNL with spo2 100% on RA, VS did not cross over.

## 2021-11-22 NOTE — ED Provider Notes (Signed)
[facility] EMERGENCY DEPARTMENT Provider Note   CSN: [id number] Arrival date & time: 11/22/21  1643     History {Add pertinent medical, surgical, social history, OB history to HPI:1} Chief Complaint  Patient presents with   Chest Pain    Rib pain    Tiffany Herrera is a 31 y.o. female.  Patient is a 31 year old female with a past medical history of recent treatment of pericarditis presenting to the emergency department with right-sided chest pain.  The patient reports that she completed her steroid burst on Tuesday and since then she has had worsening right-sided chest pain and shortness of breath.  She denies any fevers, chills or cough, nausea, vomiting or diarrhea.  She states she has been taking her colchicine and NSAIDs as prescribed.  The history is provided by the patient.  Chest Pain      Home Medications Prior to Admission medications   Medication Sig Start Date End Date Taking? Authorizing Provider  colchicine 0.6 MG tablet Take 1 tablet (0.6 mg total) by mouth 2 (two) times daily. 11/04/21 02/02/22  Pokhrel, Rebekah [place], MD  naproxen sodium (ALEVE) 220 MG tablet Take 220 mg by mouth 2 (two) times daily with a meal.    [provider]  albuterol (PROVENTIL HFA;VENTOLIN HFA) 108 (90 Base) MCG/ACT inhaler Inhale 2 puffs into the lungs every 4 (four) hours as needed for shortness of breath (cough). 05/14/18 09/21/18  Azalia Bilis, MD      Allergies    Patient has no known allergies.    Review of Systems   Review of Systems  Cardiovascular:  Positive for chest pain.    Physical Exam Updated Vital Signs BP 100/63   Pulse 76   Temp 98.7 F (37.1 C)   Resp 13   LMP 10/28/2021 (Exact Date)   SpO2 99%  Physical Exam Vitals and nursing note reviewed.  Constitutional:      General: She is not in acute distress.    Appearance: She is well-developed.  HENT:     Head: Normocephalic and atraumatic.  Eyes:     Pupils: Pupils are equal, round, and  reactive to light.  Cardiovascular:     Rate and Rhythm: Normal rate and regular rhythm.     Pulses:          Radial pulses are 2+ on the right side and 2+ on the left side.       Dorsalis pedis pulses are 2+ on the right side and 2+ on the left side.     Heart sounds: Normal heart sounds.  Pulmonary:     Effort: Pulmonary effort is normal.     Breath sounds: Normal breath sounds.  Chest:     Chest wall: Tenderness (R lateral chest) present.  Abdominal:     Palpations: Abdomen is soft.     Tenderness: There is no abdominal tenderness.  Musculoskeletal:        General: Normal range of motion.     Cervical back: Normal range of motion and neck supple.     Right lower leg: No tenderness.     Left lower leg: No tenderness.  Skin:    General: Skin is warm and dry.  Neurological:     General: No focal deficit present.     Mental Status: She is alert and oriented to person, place, and time.  Psychiatric:        Mood and Affect: Mood normal.        Behavior:  Behavior normal.     ED Results / Procedures / Treatments   Labs (all labs ordered are listed, but only abnormal results are displayed) Labs Reviewed  CBC - Abnormal; Notable for the following components:      Result Value   WBC 3.7 (*)    All other components within normal limits  SEDIMENTATION RATE - Abnormal; Notable for the following components:   Sed Rate 30 (*)    All other components within normal limits  BASIC METABOLIC PANEL  MAGNESIUM  HCG, SERUM, QUALITATIVE  C-REACTIVE PROTEIN  TROPONIN I (HIGH SENSITIVITY)  TROPONIN I (HIGH SENSITIVITY)    EKG None  Radiology DG Chest 2 View  Result Date: 11/22/2021 CLINICAL DATA:  Right-sided pleuritic chest pain. EXAM: CHEST - 2 VIEW COMPARISON:  Chest x-ray dated November 13, 2021. FINDINGS: Unchanged enlarged cardiopericardial silhouette likely related to underlying small pericardial effusion as seen on recent chest CT. Increasing small right pleural effusion with  worsening right middle and lower lobe atelectasis. Resolved trace left pleural effusion. No pneumothorax. No acute osseous abnormality. IMPRESSION: 1. Increasing small right pleural effusion. Electronically Signed   By: Obie Dredge M.D.   On: 11/22/2021 17:37    Procedures Procedures  {Document cardiac monitor, telemetry assessment procedure when appropriate:1}  Medications Ordered in ED Medications  lidocaine (LIDODERM) 5 % 1 patch (1 patch Transdermal Patch Applied 11/22/21 1848)  ketorolac (TORADOL) 15 MG/ML injection 15 mg (15 mg Intravenous Given 11/22/21 1849)  acetaminophen (TYLENOL) tablet 650 mg (650 mg Oral Given 11/22/21 1848)    ED Course/ Medical Decision Making/ A&P Clinical Course as of 11/22/21 2311  Fri Nov 22, 2021  2021 I spoke with cardiology and they state from their perspective she does not have an indication to continue steroids for pericarditis. They recommend to repeat the inflammatory markers and if flat or continuing to downtrend she can plan to follow up in their clinic as planned next month. [VK]    Clinical Course User Index [VK] Phoebe Sharps, DO                           Medical Decision Making This patient presents to the ED with chief complaint(s) of chest pain with pertinent past medical history of pericarditis which further complicates the presenting complaint. The complaint involves an extensive differential diagnosis and also carries with it a high risk of complications and morbidity.    The differential diagnosis includes pericardial effusion, pleural effusion, pneumonia, pneumothorax, ACS, arrhythmia, myocarditis costochondritis  Additional history obtained: Additional history obtained from N/A Records reviewed cardiology records   ED Course and Reassessment: Patient was initially evaluated by triage and had labs and chest x-ray performed.  Chest x-ray does show a mild increase of a small right-sided pleural effusion which may be  contributing to her pain.  She does have chest wall tenderness to palpation.  She will be given Tylenol, Toradol and lidocaine patch.  EKG is without signs of pericarditis or other abnormalities.  Remainder of her labs are pending.  Independent labs interpretation:  The following labs were independently interpreted: Downtrending sed rate, otherwise within normal range  Independent visualization of imaging: - I independently visualized the following imaging with scope of interpretation limited to determining acute life threatening conditions related to emergency care: Chest x-ray, which revealed small right-sided pleural effusion  Consultation: - Consulted or discussed management/test interpretation w/ external professional: Cardiology  Consideration for admission or further workup:  Social Determinants of health:    Amount and/or Complexity of Data Reviewed Labs: ordered. Radiology: ordered.  Risk OTC drugs. Prescription drug management.   ***  {Document critical care time when appropriate:1} {Document review of labs and clinical decision tools ie heart score, Chads2Vasc2 etc:1}  {Document your independent review of radiology images, and any outside records:1} {Document your discussion with family members, caretakers, and with consultants:1} {Document social determinants of health affecting pt's care:1} {Document your decision making why or why not admission, treatments were needed:1} Final Clinical Impression(s) / ED Diagnoses Final diagnoses:  None    Rx / DC Orders ED Discharge Orders     None

## 2021-11-22 NOTE — Plan of Care (Signed)
Cp Pericarditis- off steroids?

## 2021-11-22 NOTE — ED Triage Notes (Signed)
Pt is here for right sided rib pain.  Was seen for same 8/24 and placed on medication including prednisone, pt states that she felt fine while on the medication but after she ran out the pain returned.  Pt reports that this pain makes it difficult to take a deep breath and pain is worse when trying to lay down or take a deep breath.

## 2021-11-23 LAB — C-REACTIVE PROTEIN: CRP: 1.8 mg/dL — ABNORMAL HIGH (ref <1.0)

## 2021-11-23 NOTE — Discharge Instructions (Signed)
You were seen in the emergency department for your chest pain.  You had no signs of worsening pericarditis or fluid around your heart.  You did have a slight increase of fluid on the right side of your lung which may be contributing to your symptoms.  You had no signs of pneumonia.  You should follow-up with the rheumatologist as planned and you can follow-up with the pulmonologist for further work-up of your pleural effusion.  You should return to the emergency department if you are having significantly worsening shortness of breath, worsening chest pain, you pass out or if you have any other new or concerning symptoms.

## 2021-11-25 ENCOUNTER — Telehealth: Payer: Self-pay | Admitting: Nurse Practitioner

## 2021-11-25 ENCOUNTER — Telehealth: Payer: Self-pay | Admitting: Family Medicine

## 2021-11-25 NOTE — Telephone Encounter (Signed)
Called patient today, advised that one of the referrals was denied. However I seen a note from PCP that they had sent another referral. I did recommend to have PCP continue to follow this. She verbalized understanding. She will continue to keep [place] updated.

## 2021-11-25 NOTE — Telephone Encounter (Signed)
Patient calling to follow up on rheumatology referral, because she has not heard anything.

## 2021-11-25 NOTE — Telephone Encounter (Signed)
Patient calling back checking on the status of Rheumatologist referral and would like a follow up call today.   Copied from [place] [id number]. Topic: Referral - Status >> Nov 22, 2021  8:46 AM Tiffany B wrote: Reason for CRM:   Patient was informed by rheumatologist referral was denied and no further information was given to the patient. Referral was placed with [facility] [place]. Patient would like referral placed with another specialist and a follow up call with the status of referral.

## 2021-11-27 NOTE — Telephone Encounter (Signed)
Pt checking status of rheumatology referral  Please fu w/ pt

## 2021-11-27 NOTE — Telephone Encounter (Signed)
I have attempted without success to contact this patient by phone to I left a message on answering machine regarding referral. Patient may call back if she need more information.

## 2021-11-27 NOTE — Telephone Encounter (Signed)
Pt is calling back requesting an update.  ? ?Please advise. ?

## 2021-12-12 ENCOUNTER — Ambulatory Visit (INDEPENDENT_AMBULATORY_CARE_PROVIDER_SITE_OTHER): Payer: Medicaid Other

## 2021-12-12 ENCOUNTER — Encounter (HOSPITAL_BASED_OUTPATIENT_CLINIC_OR_DEPARTMENT_OTHER): Payer: Self-pay | Admitting: Family

## 2021-12-12 DIAGNOSIS — I300 Acute nonspecific idiopathic pericarditis: Secondary | ICD-10-CM

## 2021-12-12 LAB — ECHOCARDIOGRAM LIMITED: Area-P 1/2: 3.91 cm2

## 2021-12-13 ENCOUNTER — Encounter (HOSPITAL_BASED_OUTPATIENT_CLINIC_OR_DEPARTMENT_OTHER): Payer: Self-pay | Admitting: Nurse Practitioner

## 2021-12-13 ENCOUNTER — Ambulatory Visit (HOSPITAL_BASED_OUTPATIENT_CLINIC_OR_DEPARTMENT_OTHER): Payer: Medicaid Other | Admitting: Nurse Practitioner

## 2021-12-13 VITALS — BP 102/65 | HR 75 | Ht 66.0 in | Wt 188.4 lb

## 2021-12-13 DIAGNOSIS — J90 Pleural effusion, not elsewhere classified: Secondary | ICD-10-CM | POA: Diagnosis not present

## 2021-12-13 DIAGNOSIS — I300 Acute nonspecific idiopathic pericarditis: Secondary | ICD-10-CM

## 2021-12-13 DIAGNOSIS — M329 Systemic lupus erythematosus, unspecified: Secondary | ICD-10-CM

## 2021-12-13 DIAGNOSIS — I38 Endocarditis, valve unspecified: Secondary | ICD-10-CM | POA: Insufficient documentation

## 2021-12-13 DIAGNOSIS — I3139 Other pericardial effusion (noninflammatory): Secondary | ICD-10-CM | POA: Diagnosis not present

## 2021-12-13 MED ORDER — PANTOPRAZOLE SODIUM 40 MG PO TBEC: 40.0000 mg | DELAYED_RELEASE_TABLET | Freq: Every day | ORAL | 1 refills | Status: DC

## 2021-12-13 NOTE — Patient Instructions (Addendum)
Medication Instructions:   Continue Colchicine, Aleve *Will plan to complete a 3 month course of Colchicine  START Protonix one 40mg  tablet daily   *If you need a refill on your cardiac medications before your next appointment, please call your pharmacy*  Lab Work: Your physician recommends that you return for lab work today: sedimentation rate, CRP  If you have labs (blood work) drawn today and your tests are completely normal, you will receive your results only by: MyChart Message (if you have MyChart) OR A paper copy in the mail If you have any lab test that is abnormal or we need to change your treatment, we will call you to review the results.   Testing/Procedures: None ordered today.  Follow-Up: At [facility], you and your health needs are our priority.  As part of our continuing mission to provide you with exceptional heart care, we have created designated Provider Care Teams.  These Care Teams include your primary Cardiologist (physician) and Advanced Practice Providers (APPs -  Physician Assistants and Nurse Practitioners) who all work together to provide you with the care you need, when you need it.  We recommend signing up for the patient portal called "MyChart".  Sign up information is provided on this After Visit Summary.  MyChart is used to connect with patients for Virtual Visits (Telemedicine).  Patients are able to view lab/test results, encounter notes, upcoming appointments, etc.  Non-urgent messages can be sent to your provider as well.   To learn more about what you can do with MyChart, go to [facility].    Your next appointment:   6 week(s)  The format for your next appointment:   In Person  Provider:   [email or web address] Kirtland Bouchard Hilty, MD or [place], NP or Gillian Shields, NP  Other Instructions    Pericarditis  Pericarditis is inflammation of the pericardium. The pericardium is a thin, double-layered, fluid-filled sac that surrounds the  heart. The pericardium protects and holds the heart in the chest cavity. Inflammation of the pericardium can cause rubbing (friction) between the two layers when the heart beats. Fluid may also build up between the layers of the sac (pericardial effusion). There are five types of this condition: Acute pericarditis. Inflammation develops suddenly and causes pericardial effusion. Subacute pericarditis. Symptoms develop over a couple of days without clear onset. Recurrent pericarditis. Symptoms return after an acute episode and a symptom-free interval of 4-6 weeks. Long-term (chronic) pericarditis. Inflammation may develop slowly, or it may continue after acute pericarditis and last longer than 3 months. Constrictive pericarditis. The layers of the pericardium stiffen and develop scar tissue. The scar tissue thickens and sticks together, making it difficult for the heart to work in a normal way. This type is rare. In most cases, pericarditis is acute and not serious. Chronic pericarditis and constrictive pericarditis are more serious and may require treatment. What are the causes? In most cases, the cause of this condition is not known. If a cause is found, it may be: An infection from a virus, fungus, or bacteria. Noninfectious diseases such as: Autoimmune disorder or inflammatory disorder. The body's defense system (immune system) attacks healthy tissues. Examples include lupus or rheumatoid arthritis. A heart attack (myocardial infarction). Cancer that has spread (metastasized) to the pericardium from another part of the body. Underactive thyroid gland (hypothyroidism). Kidney failure. Noninfectious causes such as: A chest injury. After open-heart surgery. Treatment using high-energy X-rays (radiation). Certain medicines, including some seizure medicines, blood thinners, heart medicines, and antibiotics. What  increases the risk? The following factors may make you more likely to develop this  condition: Being female. Being 36-13 years old. Having had pericarditis before. Having had a recent upper respiratory tract infection. What are the signs or symptoms? The main symptom of this condition is sharp chest pain. The chest pain may: Be in the center or the left side of your chest. Worsen when you lie down and get better when you sit up and lean forward. Worsen with deep breathing. Move to your back, neck, or shoulder. Other symptoms may include: A chronic, dry cough. Heart palpitations. These may feel like rapid, fluttering, or pounding heartbeats. Dizziness or fainting. Tiredness or fatigue. Muscle aches. Fever. Rapid breathing. Shortness of breath when lying down. How is this diagnosed? This condition is diagnosed based on your medical history, a physical exam, and diagnostic tests. During your physical exam, your health care provider will listen for friction while your heart beats (pericardial rub). You may also have tests, including: Imaging, such as: Electrocardiogram (ECG). This test measures the electrical activity in your heart. Echocardiogram. This test uses sound waves to make images of your heart. Chest X-ray. CT scan or MRI, or both. Blood tests: To check for infection and inflammation. To rule out a heart attack. Culture of pericardial fluid. Removing a tissue sample of the pericardium to look at under a microscope (biopsy). If tests show that you may have constrictive pericarditis, a small, thin tube may be inserted into your heart to confirm the diagnosis (cardiac catheterization). How is this treated? Treatment for this condition depends on the cause and type of pericarditis that you have. In most cases, acute pericarditis will clear up on its own. Treatment may include: Limiting physical activity. Taking medicines such as: NSAIDs, such as ibuprofen, or aspirin to relieve pain and inflammation. Colchicine to relieve pain and inflammation. Steroids to  reduce inflammation. An antimicrobial if pericarditis is due to an infection. An IL-1 blocker called anakinra if you have recurrent pericarditis. A procedure to remove fluid using a needle (pericardiocentesis) if fluid buildup puts pressure on the heart. Surgery to remove part of the pericardium if constrictive pericarditis develops. If another condition is causing your pericarditis, you may also need treatment for that condition. Follow these instructions at home: Medicines Take over-the-counter and prescription medicines only as told by your health care provider. If you were prescribed an antimicrobial medicine, take it as told by your health care provider. Do not stop using the antimicrobial even if you start to feel better. Activity Limit your activity as told by your health care provider until your symptoms improve. This usually includes: Resting or sitting for most of the day. No long walks. No exercise. No sports. Athletes may need to limit competition for several months. General instructions Do not use any products that contain nicotine or tobacco. These products include cigarettes, chewing tobacco, and vaping devices, such as e-cigarettes. If you need help quitting, ask your health care provider. Eat a heart-healthy diet. Ask your dietitian what foods are healthy for your heart. Keep all follow-up visits. This is important. Contact a health care provider if: You continue to have symptoms of pericarditis. You develop new symptoms of pericarditis. Your symptoms get worse. You have a fever. Get help right away if: You have worsening chest pain. You have difficulty breathing. You pass out. These symptoms may be an emergency. Get help right away. Call 911. Do not wait to see if the symptoms will go away. Do not drive  yourself to the hospital. Summary Pericarditis is inflammation of the thin, double-layered, fluid-filled sac that surrounds your heart (pericardium). The main  symptom of this condition is chest pain. Treatment for this condition depends on the cause and type of pericarditis that you have and includes limiting activity. Take all medicines only as told by your health care provider. Keep all follow-up visits. This information is not intended to replace advice given to you by your health care provider. Make sure you discuss any questions you have with your health care provider. Document Revised: 10/31/2020 Document Reviewed: 10/31/2020 Elsevier Patient Education  2023 [employer].

## 2021-12-13 NOTE — Progress Notes (Addendum)
Cardiology Office Note:    Date:  12/13/2021   ID:  Tiffany Herrera, DOB 1990/09/26, MRN [id number]  PCP:  Dorna Mai, [facility] Cardiologist:  Pixie Casino, MD     Referring MD: Dorna Mai, MD   CC: Right sided pain  History of Present Illness:    Tiffany Herrera is a 31 y.o. female with a hx of the following:  Pericarditis secondary to SLE Pericardial effusion (resolved) Pleural effusion Mitral regurgitation Pulmonic regurgitation  Patient is a 31 y.o. female who is an overall healthy adult without a significant past medical history that presented to the emergency department with chief complaint of chest pain on October 31, 2021.  Stated that she developed sore throat, fevers, aches and malaise, and loss of appetite 4 days ago.  She said that her symptoms started to improve over the past day but then she developed new chest pain that brought her to the ED. chest pain was described as centrally located, sharp, worse with laying down and better with leaning forward or with sitting up.  Associated symptom of nonproductive cough that improved and denied any tenderness, lightheadedness, shortness of breath, or leg swelling.  In the emergency department she was running a fever, tachycardic, and hypotensive.  CTA of chest negative for PE, new small simple pericardial effusion noted.  Troponin slightly elevated at 38-30.  Chest x-ray revealed increased cardiac silhouette.   Cardiology was consulted.  Echo ordered revealed LVEF 65 to 16%, grade 1 diastolic dysfunction noted, small pericardial effusion was noted to be circumferential, no evidence of cardiac tamponade.  No valvular abnormalities noted.  Normal aortic root.  Otherwise, normal echocardiogram.  Repeat limited Echo done 3 days later revealed LVEF 60 to 65%, a small pericardial effusion that was circumferential, no evidence of cardiac tamponade, trivial mitral valve regurgitation noted trivial  tricuspid valve regurgitation, small pleural effusion noted in the left lateral region.  Elevated CRP and ESR, it was noted that in the past she had a previous episode of pericarditis so there was a concern for possibility of an autoimmune disease. She was treated with prednisone, colchicine and ibuprofen for acute viral pericarditis.  Discharge was planned for continuing colchicine for 3 months and prednisone for at least 2 weeks until cardiology follow-up.  It was recommended she might need slow tapering of prednisone depending on her response.  COVID and influenza were negative.  Respiratory viral panel negative.  ANA pending, C4-5, C3 complement is WNL, double-stranded DNA antibody negative.   I saw her in the office for follow-up on 11/13/21.  CC of right-sided pain under right breast tissue and right lateral chest wall that started day before, was doing well prior to this.  Unable to describe what it felt like, but she had pain with laying down on that side or when taking a deep breath. Coughing caused tightness in this area. 8/10 in intensity. Aleve did not seem to help pain, pain similar to when she had pericarditis.  Had some SHOB with certain movements or with laying down.  Works as Animal nutritionist at [employer].  She presented to [facility] ED on November 22, 2021 complaining of right-sided chest pain, just completed her steroid burst, also reported shortness of breath.  Was taking her colchicine and NSAIDs as prescribed.  Reproducible tenderness to palpation along right lateral chest on exam.  Sed rate 30, white blood cell count 3.7.  CXR showed a mild increase of  small right-sided pleural effusion, thought to have been contributing to her pain.  Cardiology consulted and stated there is no indication to continue steroids, recommend repeat inflammatory markers and if flat or continue to downtrend, plan to follow-up in clinic next month.  Given Tylenol, Toradol, and lidocaine patch.   EKG without signs of pericarditis or other abnormalities.  Sed rate downtrending, C-reactive protein minimally elevated at 1.8.  Recommended to follow-up with cardiology, rheumatology, and pulmonology.  Updated limited echo revealed EF 60 to 65%, and no pericardial effusion identified, mild MR, moderate PR, all other findings normal.  Today she presents for follow-up. She states she is doing much better.  She said she went to rheumatologist and was given an official diagnosis of lupus.  She is since started on hydroxychloroquine and will follow-up with them in the following months.  She denies any chest pain, worsening shortness of breath, palpitations, syncope, presyncope, dizziness, orthopnea, PND, bleeding, or claudication. Requesting a work note for today. Denies any other questions or concerns.     Current Medications: Current Meds  Medication Sig   colchicine 0.6 MG tablet Take 1 tablet (0.6 mg total) by mouth 2 (two) times daily.   hydroxychloroquine (PLAQUENIL) 200 MG tablet Take 200 mg by mouth 2 (two) times daily.   naproxen sodium (ALEVE) 220 MG tablet Take 220 mg by mouth 2 (two) times daily with a meal.     Allergies:   Patient has no known allergies.   Social History   Socioeconomic History   Marital status: Single    Spouse name: Not on file   Number of children: Not on file   Years of education: Not on file   Highest education level: Not on file  Occupational History   Not on file  Tobacco Use   Smoking status: Never   Smokeless tobacco: Never  Vaping Use   Vaping Use: Never used  Substance and Sexual Activity   Alcohol use: No   Drug use: No   Sexual activity: Yes    Birth control/protection: None  Other Topics Concern   Not on file  Social History Narrative   Not on file   Social Determinants of Health   Financial Resource Strain: Not on file  Food Insecurity: Not on file  Transportation Needs: Not on file  Physical Activity: Not on file  Stress: Not on  file  Social Connections: Not on file     Family History: The patient's family history includes Diabetes in her maternal aunt and maternal grandmother; Healthy in her father and mother; Hypertension in her cousin and maternal aunt.  ROS:   Review of Systems  Constitutional: Negative.   HENT: Negative.    Eyes: Negative.   Respiratory: Negative.    Cardiovascular: Negative.   Gastrointestinal: Negative.   Genitourinary: Negative.   Musculoskeletal: Negative.   Skin: Negative.   Neurological: Negative.   Endo/Heme/Allergies: Negative.   Psychiatric/Behavioral: Negative.      Please see the history of present illness.    All other systems reviewed and are negative.  EKGs/Labs/Other Studies Reviewed:    The following studies were reviewed today:   EKG:  EKG is not ordered today.   2D limited echo on December 12, 2021:  1. Left ventricular ejection fraction, by estimation, is 60 to 65%. The  left ventricle has normal function. The left ventricle has no regional  wall motion abnormalities. Left ventricular diastolic parameters were  normal.   2. Right ventricular systolic function is  normal. The right ventricular  size is normal.   3. The mitral valve is normal in structure. Mild mitral valve  regurgitation. No evidence of mitral stenosis.   4. The aortic valve is normal in structure. Aortic valve regurgitation is  not visualized. No aortic stenosis is present.   5. Pulmonic valve regurgitation is moderate.   6. The inferior vena cava is normal in size with greater than 50%  respiratory variability, suggesting right atrial pressure of 3 mmHg.   Comparison(s): No significant pericardial effusion identified.   2D limited echocardiogram on November 04, 2021: 1. Left ventricular ejection fraction, by estimation, is 60 to 65%. The  left ventricle has normal function. The left ventricle has no regional  wall motion abnormalities.   2. Right ventricular systolic function is  normal. The right ventricular  size is normal.   3. A small pericardial effusion is present. The pericardial effusion is  circumferential. There is no evidence of cardiac tamponade.   4. The mitral valve is normal in structure. Trivial mitral valve  regurgitation.   5. The aortic valve was not well visualized.   6. The inferior vena cava is normal in size with greater than 50%  respiratory variability, suggesting right atrial pressure of 3 mmHg.   Comparison(s): Compared to prior study on 11/01/21, there is no  significant change. Pericardial effusion remains small with no evidence of  tamponade.   FINDINGS   Left Ventricle: Left ventricular ejection fraction, by estimation, is 60  to 65%. The left ventricle has normal function. The left ventricle has no  regional wall motion abnormalities. The left ventricular internal cavity  size was normal in size. There is   no left ventricular hypertrophy.   Right Ventricle: The right ventricular size is normal. No increase in  right ventricular wall thickness. Right ventricular systolic function is  normal.   Left Atrium: Left atrial size was normal in size.   Right Atrium: Right atrial size was normal in size.   Pericardium: A small pericardial effusion is present. The pericardial  effusion is circumferential. There is no evidence of cardiac tamponade.   Mitral Valve: The mitral valve is normal in structure. Trivial mitral  valve regurgitation.   Tricuspid Valve: The tricuspid valve is normal in structure. Tricuspid  valve regurgitation is trivial.   Aortic Valve: The aortic valve was not well visualized.   Pulmonic Valve: The pulmonic valve was normal in structure. Pulmonic valve  regurgitation is trivial.   Venous: The inferior vena cava is normal in size with greater than 50%  respiratory variability, suggesting right atrial pressure of 3 mmHg.   Additional Comments: There is a small pleural effusion in the left lateral   region.   2D complete echocardiogram on November 01, 2021:  1. Left ventricular ejection fraction, by estimation, is 65 to 70%. Left  ventricular ejection fraction by PLAX is 68 %. The left ventricle has  normal function. The left ventricle has no regional wall motion  abnormalities. Left ventricular diastolic  parameters are consistent with Grade I diastolic dysfunction (impaired  relaxation).   2. Right ventricular systolic function is normal. The right ventricular  size is normal.   3. A small pericardial effusion is present. The pericardial effusion is  circumferential. There is no evidence of cardiac tamponade.   4. The mitral valve is normal in structure. No evidence of mitral valve  regurgitation. No evidence of mitral stenosis.   5. The aortic valve is tricuspid. Aortic valve  regurgitation is not  visualized. No aortic stenosis is present.   6. The inferior vena cava is normal in size with greater than 50%  respiratory variability, suggesting right atrial pressure of 3 mmHg.   FINDINGS   Left Ventricle: Left ventricular ejection fraction, by estimation, is 65  to 70%. Left ventricular ejection fraction by PLAX is 68 %. The left  ventricle has normal function. The left ventricle has no regional wall  motion abnormalities. The left  ventricular internal cavity size was normal in size. There is no left  ventricular hypertrophy. Left ventricular diastolic parameters are  consistent with Grade I diastolic dysfunction (impaired relaxation).   Right Ventricle: The right ventricular size is normal. No increase in  right ventricular wall thickness. Right ventricular systolic function is  normal.   Left Atrium: Left atrial size was normal in size.   Right Atrium: Right atrial size was normal in size.   Pericardium: A small pericardial effusion is present. The pericardial  effusion is circumferential. There is no evidence of cardiac tamponade.   Mitral Valve: The mitral valve is  normal in structure. No evidence of  mitral valve regurgitation. No evidence of mitral valve stenosis.   Tricuspid Valve: The tricuspid valve is normal in structure. Tricuspid  valve regurgitation is not demonstrated. No evidence of tricuspid  stenosis.   Aortic Valve: The aortic valve is tricuspid. Aortic valve regurgitation is  not visualized. No aortic stenosis is present.   Pulmonic Valve: The pulmonic valve was normal in structure. Pulmonic valve  regurgitation is not visualized. No evidence of pulmonic stenosis.   Aorta: The aortic root is normal in size and structure.   Venous: The inferior vena cava is normal in size with greater than 50%  respiratory variability, suggesting right atrial pressure of 3 mmHg.   IAS/Shunts: No atrial level shunt detected by color flow Doppler.   1 view chest x-ray on November 01, 2021: Impression: 1.  Persistent enlargement of the Cardiopericardial silhouette, which could suggest cardiomegaly and/or presence of pericardial effusion. 2.  Trace left pleural effusion.  CTA of the chest on October 31, 2021: Cardiovascular: Satisfactory opacification of the pulmonary arteries to the segmental level. No evidence of pulmonary embolism. Normal heart size. New small simple pericardial effusion.   Mediastinum/Nodes: Prominent bilateral axillary lymph nodes are unchanged and likely reactive. No enlarged mediastinal or hilar lymph nodes. Thyroid gland, trachea, and esophagus demonstrate no significant findings.   Lungs/Pleura: Minimal atelectasis at the lung bases. No focal consolidation, pleural effusion, or pneumothorax.   Upper Abdomen: No acute abnormality.   Musculoskeletal: No chest wall abnormality. No acute or significant osseous findings.   Review of the MIP images confirms the above findings.   IMPRESSION: 1. No evidence of pulmonary embolism. 2. New small simple pericardial effusion.    Recent Labs: 05/28/2021: TSH  0.874 11/03/2021: ALT 20 11/22/2021: BUN 16; Creatinine, Ser 0.81; Hemoglobin 12.2; Magnesium 1.7; Platelets 284; Potassium 4.1; Sodium 136  Recent Lipid Panel    Component Value Date/Time   CHOL 124 05/28/2021 1515   TRIG 156 (H) 05/28/2021 1515   HDL 28 (L) 05/28/2021 1515   CHOLHDL 4.4 05/28/2021 1515   LDLCALC 69 05/28/2021 1515     Physical Exam:    VS:  BP 102/65   Pulse 75   Ht 5' 6"  (1.676 m)   Wt 188 lb 6.4 oz (85.5 kg)   BMI 30.41 kg/m     Wt Readings from Last 3 Encounters:  12/13/21  188 lb 6.4 oz (85.5 kg)  11/20/21 182 lb 3.2 oz (82.6 kg)  11/13/21 179 lb 6.4 oz (81.4 kg)     GEN: Well nourished, well developed in no acute distress HEENT: Normal NECK: No JVD; No carotid bruits CARDIAC: RRR, no murmurs, rubs, gallops; 2+ peripheral pulses throughout, strong and equal bilaterally RESPIRATORY:  Clear and diminished to auscultation without rales, wheezing or rhonchi  MUSCULOSKELETAL:  No edema; No deformity  SKIN: Warm and dry NEUROLOGIC:  Alert and oriented x 3 PSYCHIATRIC:  Normal, pleasant affect   ASSESSMENT:    1. Acute idiopathic pericarditis   2. Pericardial effusion   3. Pleural effusion   4. SLE (systemic lupus erythematosus related syndrome) ([place])   5. Heart valve insufficiency     PLAN:    In order of problems listed above:  Acute idiopathic pericarditis  Secondary to new diagnosis of SLE. Significant improvement in symptoms.  Denies any recurrence in her chest pain since most recent ED visit. No friction rub noted on exam. Now on hydroxychloroquine for SLE. Continue colchicine and Aleve. At next follow-up, will re-evaluate to see if medications can be discontinued. Will obtain CRP and sed rate to evaluate trend.   2.  Pericardial effusion - resolved Resolved. Repeat limited Echo that was performed yesterday did not reveal pericardial effusion.       3. Pleural effusion - recent, stable 2 view CXR on 11/22/21 revealed increasing small R  pleural effusion. States improvement in symptoms. I suspect that this has decreased/ resolved. Discussed to let us know if symptoms worsen or she becomes more short of breath, and we can repeat CXR. Follow-up with Pulmonology next week.    4. SLE Newly diagnosed at [facility] on 12/03/21. Continue hydroxychloroquine and continue to f/u with Rheumatology. Will obtain CRP and Sed rate today to monitor trend. Continue current medication regimen and will initiate Protonix 40 mg daily for GI protection.   5. Valve insufficiency TTE on 12/12/21 revealed mild MR and moderate PR. Could be d/t most recent hx of pleural effusion/pericarditis. Asymptomatic with this. Recommend repeating 2D echo within the next year or two to monitor this or sooner if clinically indicated. Follow up with Pulmonology next week.   5. Disposition: Follow-up with Dr. Debara Pickett or APP in 6 weeks or sooner if anything changes.                            Medication Adjustments/Labs and Tests Ordered: Current medicines are reviewed at length with the patient today.  Concerns regarding medicines are outlined above.  Orders Placed This Encounter  Procedures   Sedimentation rate   C-reactive protein   Meds ordered this encounter  Medications   pantoprazole (PROTONIX) 40 MG tablet    Sig: Take 1 tablet (40 mg total) by mouth daily.    Dispense:  90 tablet    Refill:  1    Order Specific Question:   Supervising Provider    Answer:   Buford Dresser [[id number]]    Patient Instructions  Medication Instructions:   Continue Colchicine, Aleve *Will plan to complete a 3 month course of Colchicine  START Protonix one 75m tablet daily   *If you need a refill on your cardiac medications before your next appointment, please call your pharmacy*  Lab Work: Your physician recommends that you return for lab work today: sedimentation rate, CRP  If you have labs (blood work) drawn today and your  tests are completely normal, you  will receive your results only by: MyChart Message (if you have MyChart) OR A paper copy in the mail If you have any lab test that is abnormal or we need to change your treatment, we will call you to review the results.   Testing/Procedures: None ordered today.  Follow-Up: At [facility], you and your health needs are our priority.  As part of our continuing mission to provide you with exceptional heart care, we have created designated Provider Care Teams.  These Care Teams include your primary Cardiologist (physician) and Advanced Practice Providers (APPs -  Physician Assistants and Nurse Practitioners) who all work together to provide you with the care you need, when you need it.  We recommend signing up for the patient portal called "MyChart".  Sign up information is provided on this After Visit Summary.  MyChart is used to connect with patients for Virtual Visits (Telemedicine).  Patients are able to view lab/test results, encounter notes, upcoming appointments, etc.  Non-urgent messages can be sent to your provider as well.   To learn more about what you can do with MyChart, go to [email or web address].    Your next appointment:   6 week(s)  The format for your next appointment:   In Person  Provider:   Raliegh Ip Mali Hilty, MD or Laurann Montana, NP or Finis Bud, NP  Other Instructions    Pericarditis  Pericarditis is inflammation of the pericardium. The pericardium is a thin, double-layered, fluid-filled sac that surrounds the heart. The pericardium protects and holds the heart in the chest cavity. Inflammation of the pericardium can cause rubbing (friction) between the two layers when the heart beats. Fluid may also build up between the layers of the sac (pericardial effusion). There are five types of this condition: Acute pericarditis. Inflammation develops suddenly and causes pericardial effusion. Subacute pericarditis. Symptoms develop over a couple of days without  clear onset. Recurrent pericarditis. Symptoms return after an acute episode and a symptom-free interval of 4-6 weeks. Long-term (chronic) pericarditis. Inflammation may develop slowly, or it may continue after acute pericarditis and last longer than 3 months. Constrictive pericarditis. The layers of the pericardium stiffen and develop scar tissue. The scar tissue thickens and sticks together, making it difficult for the heart to work in a normal way. This type is rare. In most cases, pericarditis is acute and not serious. Chronic pericarditis and constrictive pericarditis are more serious and may require treatment. What are the causes? In most cases, the cause of this condition is not known. If a cause is found, it may be: An infection from a virus, fungus, or bacteria. Noninfectious diseases such as: Autoimmune disorder or inflammatory disorder. The body's defense system (immune system) attacks healthy tissues. Examples include lupus or rheumatoid arthritis. A heart attack (myocardial infarction). Cancer that has spread (metastasized) to the pericardium from another part of the body. Underactive thyroid gland (hypothyroidism). Kidney failure. Noninfectious causes such as: A chest injury. After open-heart surgery. Treatment using high-energy X-rays (radiation). Certain medicines, including some seizure medicines, blood thinners, heart medicines, and antibiotics. What increases the risk? The following factors may make you more likely to develop this condition: Being female. Being 39-42 years old. Having had pericarditis before. Having had a recent upper respiratory tract infection. What are the signs or symptoms? The main symptom of this condition is sharp chest pain. The chest pain may: Be in the center or the left side of your chest. Worsen when you lie down  and get better when you sit up and lean forward. Worsen with deep breathing. Move to your back, neck, or shoulder. Other symptoms  may include: A chronic, dry cough. Heart palpitations. These may feel like rapid, fluttering, or pounding heartbeats. Dizziness or fainting. Tiredness or fatigue. Muscle aches. Fever. Rapid breathing. Shortness of breath when lying down. How is this diagnosed? This condition is diagnosed based on your medical history, a physical exam, and diagnostic tests. During your physical exam, your health care provider will listen for friction while your heart beats (pericardial rub). You may also have tests, including: Imaging, such as: Electrocardiogram (ECG). This test measures the electrical activity in your heart. Echocardiogram. This test uses sound waves to make images of your heart. Chest X-ray. CT scan or MRI, or both. Blood tests: To check for infection and inflammation. To rule out a heart attack. Culture of pericardial fluid. Removing a tissue sample of the pericardium to look at under a microscope (biopsy). If tests show that you may have constrictive pericarditis, a small, thin tube may be inserted into your heart to confirm the diagnosis (cardiac catheterization). How is this treated? Treatment for this condition depends on the cause and type of pericarditis that you have. In most cases, acute pericarditis will clear up on its own. Treatment may include: Limiting physical activity. Taking medicines such as: NSAIDs, such as ibuprofen, or aspirin to relieve pain and inflammation. Colchicine to relieve pain and inflammation. Steroids to reduce inflammation. An antimicrobial if pericarditis is due to an infection. An IL-1 blocker called anakinra if you have recurrent pericarditis. A procedure to remove fluid using a needle (pericardiocentesis) if fluid buildup puts pressure on the heart. Surgery to remove part of the pericardium if constrictive pericarditis develops. If another condition is causing your pericarditis, you may also need treatment for that condition. Follow these  instructions at home: Medicines Take over-the-counter and prescription medicines only as told by your health care provider. If you were prescribed an antimicrobial medicine, take it as told by your health care provider. Do not stop using the antimicrobial even if you start to feel better. Activity Limit your activity as told by your health care provider until your symptoms improve. This usually includes: Resting or sitting for most of the day. No long walks. No exercise. No sports. Athletes may need to limit competition for several months. General instructions Do not use any products that contain nicotine or tobacco. These products include cigarettes, chewing tobacco, and vaping devices, such as e-cigarettes. If you need help quitting, ask your health care provider. Eat a heart-healthy diet. Ask your dietitian what foods are healthy for your heart. Keep all follow-up visits. This is important. Contact a health care provider if: You continue to have symptoms of pericarditis. You develop new symptoms of pericarditis. Your symptoms get worse. You have a fever. Get help right away if: You have worsening chest pain. You have difficulty breathing. You pass out. These symptoms may be an emergency. Get help right away. Call 911. Do not wait to see if the symptoms will go away. Do not drive yourself to the hospital. Summary Pericarditis is inflammation of the thin, double-layered, fluid-filled sac that surrounds your heart (pericardium). The main symptom of this condition is chest pain. Treatment for this condition depends on the cause and type of pericarditis that you have and includes limiting activity. Take all medicines only as told by your health care provider. Keep all follow-up visits. This information is not intended to replace  advice given to you by your health care provider. Make sure you discuss any questions you have with your health care provider. Document Revised: 10/31/2020  Document Reviewed: 10/31/2020 Elsevier Patient Education  [place], Finis Bud, NP  12/13/2021 2:08 PM    [place]

## 2021-12-14 LAB — SEDIMENTATION RATE: Sed Rate: 22 mm/hr (ref 0–32)

## 2021-12-14 LAB — C-REACTIVE PROTEIN: CRP: 2 mg/L (ref 0–10)

## 2021-12-18 ENCOUNTER — Encounter: Payer: Self-pay | Admitting: Pulmonary Disease

## 2021-12-18 ENCOUNTER — Ambulatory Visit (INDEPENDENT_AMBULATORY_CARE_PROVIDER_SITE_OTHER): Payer: Medicaid Other | Admitting: Pulmonary Disease

## 2021-12-18 VITALS — BP 112/66 | HR 83 | Temp 98.3°F | Ht 66.0 in | Wt 190.0 lb

## 2021-12-18 DIAGNOSIS — M329 Systemic lupus erythematosus, unspecified: Secondary | ICD-10-CM | POA: Diagnosis not present

## 2021-12-18 DIAGNOSIS — J90 Pleural effusion, not elsewhere classified: Secondary | ICD-10-CM

## 2021-12-18 NOTE — H&P (View-Only) (Signed)
@Patient  ID: Tiffany Herrera, female    DOB: [birth year], 31 y.o.   MRN: [id number]  Chief Complaint  Patient presents with   Consult    Has seen Cardiologist for fluid around her heart. Pt states that is better now and she is better now    Referring provider: Kemper Durie, DO  HPI:   31 y.o. whom are seen in consultation for evaluation of right pleural effusion.  Most recent PCP note reviewed.  Discharge summary 10/2021 reviewed.  ED note 11/2021 reviewed.  Patient presented to ED 10/2021 with cough and chest pain.  CT scan of the chest was obtained with contrast revealed no infiltrate, no pleural effusion, no PE, pericardial effusion present on my review interpretation.  This prompted echocardiogram which confirmed pericardial effusion.  She was placed on 2-week prednisone.  Also colchicine.  As well as naproxen.  Had a chest x-ray 11/13/2021 that on my review interpretation reveals clear lungs with small, tiny bilateral left greater than right pleural effusions.  Her symptoms improved but then recurred.  Presented to ED 11/22/2021.  There, chest x-ray reveals resolution of left pleural effusion but worsening and increase in size to small to moderate right pleural effusion on my review and interpretation.  In the interim she was diagnosed with lupus via a rheumatologist.  Started Plaquenil.  Overall her symptoms are much improved.  Chest pain gone.  Dyspnea gone.  Feels back to baseline.  PMH: Lupus Surgical history: Reviewed, she denies any Family history: No significant respiratory illness in first-degree relative Social history: Never smoker, lives in [place] / Pulmonary Flowsheets:   ACT:      No data to display          MMRC:     No data to display          Epworth:      No data to display          Tests:   FENO:  No results found for: "NITRICOXIDE"  PFT:     No data to display          WALK:      No data to display           Imaging: Personally reviewed and as per EMR discussion this note ECHOCARDIOGRAM LIMITED  Result Date: 12/12/2021    ECHOCARDIOGRAM LIMITED REPORT   Patient Name:   Tiffany Herrera Date of Exam: 12/12/2021 Medical Rec #:  [id number]        Height:       66.0 in Accession #:    [id number]       Weight:       182.2 lb Date of Birth:  [birth year]       BSA:          1.922 m Patient Age:    31 years         BP:           100/60 mmHg Patient Gender: F                HR:           75 bpm. Exam Location:  Outpatient Procedure: Limited Echo, Color Doppler and Cardiac Doppler Indications:    Acute idiopathic pericarditis  History:        Patient has prior history of Echocardiogram examinations, most                 recent 11/04/2021. Signs/Symptoms:Chest Pain;  Risk                 Factors:Non-Smoker. Pericarditis                 Pericardial effusion.  Sonographer:    Leavy Cella RDCS Referring Phys: [id number] [place]  1. Left ventricular ejection fraction, by estimation, is 60 to 65%. The left ventricle has normal function. The left ventricle has no regional wall motion abnormalities. Left ventricular diastolic parameters were normal.  2. Right ventricular systolic function is normal. The right ventricular size is normal.  3. The mitral valve is normal in structure. Mild mitral valve regurgitation. No evidence of mitral stenosis.  4. The aortic valve is normal in structure. Aortic valve regurgitation is not visualized. No aortic stenosis is present.  5. Pulmonic valve regurgitation is moderate.  6. The inferior vena cava is normal in size with greater than 50% respiratory variability, suggesting right atrial pressure of 3 mmHg. Comparison(s): No significant pericardial effusion identified. FINDINGS  Left Ventricle: Left ventricular ejection fraction, by estimation, is 60 to 65%. The left ventricle has normal function. The left ventricle has no regional wall motion abnormalities. The left  ventricular internal cavity size was normal in size. There is  no left ventricular hypertrophy. Left ventricular diastolic parameters were normal. Right Ventricle: The right ventricular size is normal. No increase in right ventricular wall thickness. Right ventricular systolic function is normal. Left Atrium: Left atrial size was normal in size. Right Atrium: Right atrial size was normal in size. Pericardium: There is no evidence of pericardial effusion. Mitral Valve: The mitral valve is normal in structure. Mild mitral valve regurgitation. No evidence of mitral valve stenosis. Tricuspid Valve: The tricuspid valve is normal in structure. Tricuspid valve regurgitation is mild . No evidence of tricuspid stenosis. Aortic Valve: The aortic valve is normal in structure. Aortic valve regurgitation is not visualized. No aortic stenosis is present. Pulmonic Valve: The pulmonic valve was normal in structure. Pulmonic valve regurgitation is moderate. No evidence of pulmonic stenosis. Aorta: The aortic root is normal in size and structure. Venous: The inferior vena cava is normal in size with greater than 50% respiratory variability, suggesting right atrial pressure of 3 mmHg. IAS/Shunts: No atrial level shunt detected by color flow Doppler. AORTIC VALVE LVOT Vmax:   114.00 cm/s LVOT Vmean:  72.800 cm/s LVOT VTI:    0.229 m MITRAL VALVE MV Area (PHT): 3.91 cm    SHUNTS MV Decel Time: 194 msec    Systemic VTI: 0.23 m MV E velocity: 91.70 cm/s MV A velocity: 63.40 cm/s MV E/A ratio:  1.45 Candee Furbish MD Electronically signed by Candee Furbish MD Signature Date/Time: 12/12/2021/4:31:02 PM    Final    DG Chest 2 View  Result Date: 11/22/2021 CLINICAL DATA:  Right-sided pleuritic chest pain. EXAM: CHEST - 2 VIEW COMPARISON:  Chest x-ray dated November 13, 2021. FINDINGS: Unchanged enlarged cardiopericardial silhouette likely related to underlying small pericardial effusion as seen on recent chest CT. Increasing small right pleural  effusion with worsening right middle and lower lobe atelectasis. Resolved trace left pleural effusion. No pneumothorax. No acute osseous abnormality. IMPRESSION: 1. Increasing small right pleural effusion. Electronically Signed   By: Titus Dubin M.D.   On: 11/22/2021 17:37    Lab Results: Personally reviewed CBC    Component Value Date/Time   WBC 3.7 (L) 11/22/2021 1713   RBC 4.43 11/22/2021 1713   HGB 12.2 11/22/2021 1713   HGB 12.1 11/13/2021  1226   HCT 37.8 11/22/2021 1713   HCT 37.9 11/13/2021 1226   PLT 284 11/22/2021 1713   PLT 494 (H) 11/13/2021 1226   MCV 85.3 11/22/2021 1713   MCV 85 11/13/2021 1226   MCH 27.5 11/22/2021 1713   MCHC 32.3 11/22/2021 1713   RDW 14.7 11/22/2021 1713   RDW 13.9 11/13/2021 1226   LYMPHSABS 1.5 10/31/2021 1526   MONOABS 0.6 10/31/2021 1526   EOSABS 0.0 10/31/2021 1526   BASOSABS 0.0 10/31/2021 1526    BMET    Component Value Date/Time   NA 136 11/22/2021 1713   NA 141 11/13/2021 1226   K 4.1 11/22/2021 1713   CL 102 11/22/2021 1713   CO2 26 11/22/2021 1713   GLUCOSE 95 11/22/2021 1713   BUN 16 11/22/2021 1713   BUN 15 11/13/2021 1226   CREATININE 0.81 11/22/2021 1713   CALCIUM 8.9 11/22/2021 1713   GFRNONAA >60 11/22/2021 1713   GFRAA >60 05/15/2019 1658    BNP No results found for: "BNP"  ProBNP No results found for: "PROBNP"  Specialty Problems       Pulmonary Problems   Acute cough   Pleural effusion    No Known Allergies   There is no immunization history on file for this patient.  Past Medical History:  Diagnosis Date   Acute idiopathic pericarditis    Heart valve insufficiency    Lupus (systemic lupus erythematosus) (HCC)    Pericardial effusion - resolved    Pleural effusion     Tobacco History: Social History   Tobacco Use  Smoking Status Never  Smokeless Tobacco Current  Tobacco Comments   Pt smokes hooka 1-2 times per month PAP 12/18/2021   Ready to quit: Not Answered Counseling given:  Not Answered Tobacco comments: Pt smokes hooka 1-2 times per month PAP 12/18/2021   Continue to not smoke  Outpatient Encounter Medications as of 12/18/2021  Medication Sig   colchicine 0.6 MG tablet Take 1 tablet (0.6 mg total) by mouth 2 (two) times daily.   hydroxychloroquine (PLAQUENIL) 200 MG tablet Take 200 mg by mouth 2 (two) times daily.   naproxen sodium (ALEVE) 220 MG tablet Take 220 mg by mouth 2 (two) times daily with a meal.   pantoprazole (PROTONIX) 40 MG tablet Take 1 tablet (40 mg total) by mouth daily.   [DISCONTINUED] albuterol (PROVENTIL HFA;VENTOLIN HFA) 108 (90 Base) MCG/ACT inhaler Inhale 2 puffs into the lungs every 4 (four) hours as needed for shortness of breath (cough).   No facility-administered encounter medications on file as of 12/18/2021.     Review of Systems  Review of Systems  No chest pain with exertion.  No orthopnea or PND.  Comprehensive review of systems otherwise negative. Physical Exam  BP 112/66 (BP Location: Left Arm, Patient Position: Sitting, Cuff Size: Normal)   Pulse 83   Temp 98.3 F (36.8 C) (Oral)   Ht 5\' 6"  (1.676 m)   Wt 190 lb (86.2 kg)   SpO2 98%   BMI 30.67 kg/m   Wt Readings from Last 5 Encounters:  12/18/21 190 lb (86.2 kg)  12/13/21 188 lb 6.4 oz (85.5 kg)  11/20/21 182 lb 3.2 oz (82.6 kg)  11/13/21 179 lb 6.4 oz (81.4 kg)  11/04/21 173 lb 4.5 oz (78.6 kg)    BMI Readings from Last 5 Encounters:  12/18/21 30.67 kg/m  12/13/21 30.41 kg/m  11/20/21 29.41 kg/m  11/13/21 28.96 kg/m  11/04/21 27.97 kg/m  Physical Exam General: Sitting in chair, no acute distress Eyes: EOMI, no icterus Neck: Supple, no JVP Pulmonary: Breath sounds relatively clear, mild diminished sounds on the right base, normal work of breathing Cardiovascular: Warm, no edema Abdomen: Nondistended, bowel sounds present MSK: No synovitis, no joint effusion Neuro: Normal gait, no weakness Psych: Normal mood, full  affect   Assessment & Plan:   Pleural effusions: Initially left greater than right and tiny, left seems to resolved on subsequent chest imaging with increasing size of right.  Point-of-care ultrasound today reveals small to moderate size right pleural effusion, approximately 4 cm of hypoechoic appearing fluid prior to lung field with deeper pocket in the gutter.  Given recent diagnosis of lupus and pericarditis, suspect this is related to lupus and highly inflammatory.  As such recommend thoracentesis for fluid removal as well as for diagnostic purposes.  Fortunately, she has minimal to no symptoms in terms of dyspnea cough pain etc.  We will arrange for thoracentesis in the coming days.  Risk and benefits explained.  Lupus: Recent diagnosis.  On Plaquenil.  Likely contributing to pleural effusion.  Thoracentesis as above.    Return in about 4 weeks (around 01/15/2022).   Lanier Clam, MD 12/18/2021

## 2021-12-18 NOTE — Progress Notes (Signed)
 @Patient ID: Tiffany Herrera, female    DOB: 03/21/1990, 30 y.o.   MRN: [id number]  Chief Complaint  Patient presents with   Consult    Has seen Cardiologist for fluid around her heart. Pt states that is better now and she is better now    Referring provider: Kingsley, Victoria K, DO  HPI:   30 y.o. whom are seen in consultation for evaluation of right pleural effusion.  Most recent PCP note reviewed.  Discharge summary 10/2021 reviewed.  ED note 11/2021 reviewed.  Patient presented to ED 10/2021 with cough and chest pain.  CT scan of the chest was obtained with contrast revealed no infiltrate, no pleural effusion, no PE, pericardial effusion present on my review interpretation.  This prompted echocardiogram which confirmed pericardial effusion.  She was placed on 2-week prednisone.  Also colchicine.  As well as naproxen.  Had a chest x-ray 11/13/2021 that on my review interpretation reveals clear lungs with small, tiny bilateral left greater than right pleural effusions.  Her symptoms improved but then recurred.  Presented to ED 11/22/2021.  There, chest x-ray reveals resolution of left pleural effusion but worsening and increase in size to small to moderate right pleural effusion on my review and interpretation.  In the interim she was diagnosed with lupus via a rheumatologist.  Started Plaquenil.  Overall her symptoms are much improved.  Chest pain gone.  Dyspnea gone.  Feels back to baseline.  PMH: Lupus Surgical history: Reviewed, she denies any Family history: No significant respiratory illness in first-degree relative Social history: Never smoker, lives in [place]   Questionaires / Pulmonary Flowsheets:   ACT:      No data to display          MMRC:     No data to display          Epworth:      No data to display          Tests:   FENO:  No results found for: "NITRICOXIDE"  PFT:     No data to display          WALK:      No data to display           Imaging: Personally reviewed and as per EMR discussion this note ECHOCARDIOGRAM LIMITED  Result Date: 12/12/2021    ECHOCARDIOGRAM LIMITED REPORT   Patient Name:   Tiffany Herrera Date of Exam: 12/12/2021 Medical Rec #:  [id number]        Height:       66.0 in Accession #:    [id number]       Weight:       182.2 lb Date of Birth:  04/03/1990       BSA:          1.922 m Patient Age:    30 years         BP:           100/60 mmHg Patient Gender: F                HR:           75 bpm. Exam Location:  Outpatient Procedure: Limited Echo, Color Doppler and Cardiac Doppler Indications:    Acute idiopathic pericarditis  History:        Patient has prior history of Echocardiogram examinations, most                 recent 11/04/2021. Signs/Symptoms:Chest Pain;   Risk                 Factors:Non-Smoker. Pericarditis                 Pericardial effusion.  Sonographer:    Johanna Elliott RDCS Referring Phys: [id number] ELIZABETH PECK IMPRESSIONS  1. Left ventricular ejection fraction, by estimation, is 60 to 65%. The left ventricle has normal function. The left ventricle has no regional wall motion abnormalities. Left ventricular diastolic parameters were normal.  2. Right ventricular systolic function is normal. The right ventricular size is normal.  3. The mitral valve is normal in structure. Mild mitral valve regurgitation. No evidence of mitral stenosis.  4. The aortic valve is normal in structure. Aortic valve regurgitation is not visualized. No aortic stenosis is present.  5. Pulmonic valve regurgitation is moderate.  6. The inferior vena cava is normal in size with greater than 50% respiratory variability, suggesting right atrial pressure of 3 mmHg. Comparison(s): No significant pericardial effusion identified. FINDINGS  Left Ventricle: Left ventricular ejection fraction, by estimation, is 60 to 65%. The left ventricle has normal function. The left ventricle has no regional wall motion abnormalities. The left  ventricular internal cavity size was normal in size. There is  no left ventricular hypertrophy. Left ventricular diastolic parameters were normal. Right Ventricle: The right ventricular size is normal. No increase in right ventricular wall thickness. Right ventricular systolic function is normal. Left Atrium: Left atrial size was normal in size. Right Atrium: Right atrial size was normal in size. Pericardium: There is no evidence of pericardial effusion. Mitral Valve: The mitral valve is normal in structure. Mild mitral valve regurgitation. No evidence of mitral valve stenosis. Tricuspid Valve: The tricuspid valve is normal in structure. Tricuspid valve regurgitation is mild . No evidence of tricuspid stenosis. Aortic Valve: The aortic valve is normal in structure. Aortic valve regurgitation is not visualized. No aortic stenosis is present. Pulmonic Valve: The pulmonic valve was normal in structure. Pulmonic valve regurgitation is moderate. No evidence of pulmonic stenosis. Aorta: The aortic root is normal in size and structure. Venous: The inferior vena cava is normal in size with greater than 50% respiratory variability, suggesting right atrial pressure of 3 mmHg. IAS/Shunts: No atrial level shunt detected by color flow Doppler. AORTIC VALVE LVOT Vmax:   114.00 cm/s LVOT Vmean:  72.800 cm/s LVOT VTI:    0.229 m MITRAL VALVE MV Area (PHT): 3.91 cm    SHUNTS MV Decel Time: 194 msec    Systemic VTI: 0.23 m MV E velocity: 91.70 cm/s MV A velocity: 63.40 cm/s MV E/A ratio:  1.45 Mark Skains MD Electronically signed by Mark Skains MD Signature Date/Time: 12/12/2021/4:31:02 PM    Final    DG Chest 2 View  Result Date: 11/22/2021 CLINICAL DATA:  Right-sided pleuritic chest pain. EXAM: CHEST - 2 VIEW COMPARISON:  Chest x-ray dated November 13, 2021. FINDINGS: Unchanged enlarged cardiopericardial silhouette likely related to underlying small pericardial effusion as seen on recent chest CT. Increasing small right pleural  effusion with worsening right middle and lower lobe atelectasis. Resolved trace left pleural effusion. No pneumothorax. No acute osseous abnormality. IMPRESSION: 1. Increasing small right pleural effusion. Electronically Signed   By: William T Derry M.D.   On: 11/22/2021 17:37    Lab Results: Personally reviewed CBC    Component Value Date/Time   WBC 3.7 (L) 11/22/2021 1713   RBC 4.43 11/22/2021 1713   HGB 12.2 11/22/2021 1713   HGB 12.1 11/13/2021   1226   HCT 37.8 11/22/2021 1713   HCT 37.9 11/13/2021 1226   PLT 284 11/22/2021 1713   PLT 494 (H) 11/13/2021 1226   MCV 85.3 11/22/2021 1713   MCV 85 11/13/2021 1226   MCH 27.5 11/22/2021 1713   MCHC 32.3 11/22/2021 1713   RDW 14.7 11/22/2021 1713   RDW 13.9 11/13/2021 1226   LYMPHSABS 1.5 10/31/2021 1526   MONOABS 0.6 10/31/2021 1526   EOSABS 0.0 10/31/2021 1526   BASOSABS 0.0 10/31/2021 1526    BMET    Component Value Date/Time   NA 136 11/22/2021 1713   NA 141 11/13/2021 1226   K 4.1 11/22/2021 1713   CL 102 11/22/2021 1713   CO2 26 11/22/2021 1713   GLUCOSE 95 11/22/2021 1713   BUN 16 11/22/2021 1713   BUN 15 11/13/2021 1226   CREATININE 0.81 11/22/2021 1713   CALCIUM 8.9 11/22/2021 1713   GFRNONAA >60 11/22/2021 1713   GFRAA >60 05/15/2019 1658    BNP No results found for: "BNP"  ProBNP No results found for: "PROBNP"  Specialty Problems       Pulmonary Problems   Acute cough   Pleural effusion    No Known Allergies   There is no immunization history on file for this patient.  Past Medical History:  Diagnosis Date   Acute idiopathic pericarditis    Heart valve insufficiency    Lupus (systemic lupus erythematosus) (HCC)    Pericardial effusion - resolved    Pleural effusion     Tobacco History: Social History   Tobacco Use  Smoking Status Never  Smokeless Tobacco Current  Tobacco Comments   Pt smokes hooka 1-2 times per month PAP 12/18/2021   Ready to quit: Not Answered Counseling given:  Not Answered Tobacco comments: Pt smokes hooka 1-2 times per month PAP 12/18/2021   Continue to not smoke  Outpatient Encounter Medications as of 12/18/2021  Medication Sig   colchicine 0.6 MG tablet Take 1 tablet (0.6 mg total) by mouth 2 (two) times daily.   hydroxychloroquine (PLAQUENIL) 200 MG tablet Take 200 mg by mouth 2 (two) times daily.   naproxen sodium (ALEVE) 220 MG tablet Take 220 mg by mouth 2 (two) times daily with a meal.   pantoprazole (PROTONIX) 40 MG tablet Take 1 tablet (40 mg total) by mouth daily.   [DISCONTINUED] albuterol (PROVENTIL HFA;VENTOLIN HFA) 108 (90 Base) MCG/ACT inhaler Inhale 2 puffs into the lungs every 4 (four) hours as needed for shortness of breath (cough).   No facility-administered encounter medications on file as of 12/18/2021.     Review of Systems  Review of Systems  No chest pain with exertion.  No orthopnea or PND.  Comprehensive review of systems otherwise negative. Physical Exam  BP 112/66 (BP Location: Left Arm, Patient Position: Sitting, Cuff Size: Normal)   Pulse 83   Temp 98.3 F (36.8 C) (Oral)   Ht 5' 6" (1.676 m)   Wt 190 lb (86.2 kg)   SpO2 98%   BMI 30.67 kg/m   Wt Readings from Last 5 Encounters:  12/18/21 190 lb (86.2 kg)  12/13/21 188 lb 6.4 oz (85.5 kg)  11/20/21 182 lb 3.2 oz (82.6 kg)  11/13/21 179 lb 6.4 oz (81.4 kg)  11/04/21 173 lb 4.5 oz (78.6 kg)    BMI Readings from Last 5 Encounters:  12/18/21 30.67 kg/m  12/13/21 30.41 kg/m  11/20/21 29.41 kg/m  11/13/21 28.96 kg/m  11/04/21 27.97 kg/m       Physical Exam General: Sitting in chair, no acute distress Eyes: EOMI, no icterus Neck: Supple, no JVP Pulmonary: Breath sounds relatively clear, mild diminished sounds on the right base, normal work of breathing Cardiovascular: Warm, no edema Abdomen: Nondistended, bowel sounds present MSK: No synovitis, no joint effusion Neuro: Normal gait, no weakness Psych: Normal mood, full  affect   Assessment & Plan:   Pleural effusions: Initially left greater than right and tiny, left seems to resolved on subsequent chest imaging with increasing size of right.  Point-of-care ultrasound today reveals small to moderate size right pleural effusion, approximately 4 cm of hypoechoic appearing fluid prior to lung field with deeper pocket in the gutter.  Given recent diagnosis of lupus and pericarditis, suspect this is related to lupus and highly inflammatory.  As such recommend thoracentesis for fluid removal as well as for diagnostic purposes.  Fortunately, she has minimal to no symptoms in terms of dyspnea cough pain etc.  We will arrange for thoracentesis in the coming days.  Risk and benefits explained.  Lupus: Recent diagnosis.  On Plaquenil.  Likely contributing to pleural effusion.  Thoracentesis as above.    Return in about 4 weeks (around 01/15/2022).   Lanier Clam, MD 12/18/2021

## 2021-12-18 NOTE — Patient Instructions (Addendum)
Nice to meet you  On the ultrasound, pleural effusion or fluid around the lung that was seen a few weeks ago is still present.  I think this is likely related to your recent diagnosis of lupus.  Often times, the fluid is very inflammatory and I recommend we drain it to prevent complications from the fluid being there for too long.  I will look at mine and my colleagues scheduled for the upcoming week to see if we can get a procedure called a thoracentesis scheduled.  This is a procedure to drain fluid from around the lung.  I like to see you back in clinic in 4 to 6 weeks he can make sure the fluid remains calm after draining it.  No changes to medication today  Return to clinic in 4 to 6 weeks with Dr. Silas Flood

## 2021-12-24 ENCOUNTER — Encounter (HOSPITAL_COMMUNITY)
Admission: RE | Disposition: A | Discharge status: Home / Self Care | Payer: Self-pay | Source: Ambulatory Visit | Attending: Pulmonary Disease

## 2021-12-24 ENCOUNTER — Ambulatory Visit (HOSPITAL_COMMUNITY)
Admission: RE | Admit: 2021-12-24 | Discharge: 2021-12-24 | Disposition: A | Discharge status: Home / Self Care | Payer: Medicaid Other | Source: Ambulatory Visit | Attending: Pulmonary Disease | Admitting: Pulmonary Disease

## 2021-12-24 DIAGNOSIS — J90 Pleural effusion, not elsewhere classified: Secondary | ICD-10-CM | POA: Insufficient documentation

## 2021-12-24 SURGERY — CANCELLED PROCEDURE

## 2021-12-24 NOTE — Interval H&P Note (Signed)
    Tiffany Herrera has been scheduled for Procedure(s): THORACENTESIS (N/A) today. The various methods of treatment have been discussed with the patient. After consideration of the risks, benefits and treatment options the patient has consented to the planned procedure.   The patient has been seen and labs reviewed. There are no changes in the patient's condition to prevent proceeding with the planned procedure today.  Recent labs:  Lab Results  Component Value Date   WBC 3.7 (L) 11/22/2021   HGB 12.2 11/22/2021   HCT 37.8 11/22/2021   PLT 284 11/22/2021   GLUCOSE 95 11/22/2021   CHOL 124 05/28/2021   TRIG 156 (H) 05/28/2021   HDL 28 (L) 05/28/2021   LDLCALC 69 05/28/2021   ALT 20 11/03/2021   AST 19 11/03/2021   NA 136 11/22/2021   K 4.1 11/22/2021   CL 102 11/22/2021   CREATININE 0.81 11/22/2021   BUN 16 11/22/2021   CO2 26 11/22/2021   TSH 0.874 05/28/2021    Lanier Clam, MD 12/24/2021 3:00 PM

## 2021-12-24 NOTE — Consult Note (Signed)
Patient presented for planned thoracentesis for right-sided pleural effusion has had lupus, concern for highly inflammatory effusion that needs to be drained.  At office visit last week, fluid pocket approximate 4-5 cm.  On bedside ultrasound today, examination from spine to mid axillary line on the right reveals maximum pocket approximate 2 cm.  Very small amount of fluid.  Given improvement with treatment per rheumatology, I do not think the risk procedure outweighed the benefit.  This explained to the patient.  We did not move forward with procedure.  I request no charges be filed for this visit.

## 2022-01-20 ENCOUNTER — Ambulatory Visit: Payer: Medicaid Other | Admitting: Pulmonary Disease

## 2022-01-20 ENCOUNTER — Ambulatory Visit (INDEPENDENT_AMBULATORY_CARE_PROVIDER_SITE_OTHER): Payer: Medicaid Other

## 2022-01-20 ENCOUNTER — Encounter: Payer: Self-pay | Admitting: Pulmonary Disease

## 2022-01-20 VITALS — BP 122/64 | HR 64 | Temp 98.7°F | Wt 196.8 lb

## 2022-01-20 DIAGNOSIS — J90 Pleural effusion, not elsewhere classified: Secondary | ICD-10-CM

## 2022-01-20 NOTE — Progress Notes (Signed)
@Patient  ID: , female    DOB: Jul 17, 1990, 31 y.o.   MRN: [id number]  Chief Complaint  Patient presents with   Follow-up    Pt is here for follow up for pleural effusion. Pt was supposed to have thoro done on 10/17 but was noted that the fluid went down and she did not need the procedure. Pt states she is doing well since last months visit.     Referring provider: 11/17, MD  HPI:   31 y.o. whom are seeing in follow up for evaluation of right pleural effusion.  Most recent cardiology note reviewed.    Overall, doing well.  At last visit concern for pleural effusion.  On bedside ultrasound in the office 12/18/2021, about a 5 cm pocket on the right.  None on the left.  Thoracentesis scheduled 12/24/2021 and on ultrasound that time less than 2 cm pocket in a lot of places no pocket of fluid.  Much improved.  Thoracentesis was canceled.  She returns today for routine follow-up.  No issues with breathing.  No chest pain etc.  Exam is clear which is reassuring in terms of no pleural effusion.  Discussed role and rationale for establishing new chest x-ray, baseline, which she is agreeable to.   HPI at initial visit: Patient presented to ED 10/2021 with cough and chest pain.  CT scan of the chest was obtained with contrast revealed no infiltrate, no pleural effusion, no PE, pericardial effusion present on my review interpretation.  This prompted echocardiogram which confirmed pericardial effusion.  She was placed on 2-week prednisone.  Also colchicine.  As well as naproxen.  Had a chest x-ray 11/13/2021 that on my review interpretation reveals clear lungs with small, tiny bilateral left greater than right pleural effusions.  Her symptoms improved but then recurred.  Presented to ED 11/22/2021.  There, chest x-ray reveals resolution of left pleural effusion but worsening and increase in size to small to moderate right pleural effusion on my review and interpretation.  In the interim  she was diagnosed with lupus via a rheumatologist.  Started Plaquenil.  Overall her symptoms are much improved.  Chest pain gone.  Dyspnea gone.  Feels back to baseline.  PMH: Lupus Surgical history: Reviewed, she denies any Family history: No significant respiratory illness in first-degree relative Social history: Never smoker, lives in [place] / Pulmonary Flowsheets:   ACT:      No data to display           MMRC:     No data to display           Epworth:      No data to display           Tests:   FENO:  No results found for: "NITRICOXIDE"  PFT:     No data to display           WALK:      No data to display           Imaging: Personally reviewed and as per EMR discussion this note No results found.  Lab Results: Personally reviewed CBC    Component Value Date/Time   WBC 3.7 (L) 11/22/2021 1713   RBC 4.43 11/22/2021 1713   HGB 12.2 11/22/2021 1713   HGB 12.1 11/13/2021 1226   HCT 37.8 11/22/2021 1713   HCT 37.9 11/13/2021 1226   PLT 284 11/22/2021 1713   PLT 494 (H) 11/13/2021 1226   MCV  85.3 11/22/2021 1713   MCV 85 11/13/2021 1226   MCH 27.5 11/22/2021 1713   MCHC 32.3 11/22/2021 1713   RDW 14.7 11/22/2021 1713   RDW 13.9 11/13/2021 1226   LYMPHSABS 1.5 10/31/2021 1526   MONOABS 0.6 10/31/2021 1526   EOSABS 0.0 10/31/2021 1526   BASOSABS 0.0 10/31/2021 1526    BMET    Component Value Date/Time   NA 136 11/22/2021 1713   NA 141 11/13/2021 1226   K 4.1 11/22/2021 1713   CL 102 11/22/2021 1713   CO2 26 11/22/2021 1713   GLUCOSE 95 11/22/2021 1713   BUN 16 11/22/2021 1713   BUN 15 11/13/2021 1226   CREATININE 0.81 11/22/2021 1713   CALCIUM 8.9 11/22/2021 1713   GFRNONAA >60 11/22/2021 1713   GFRAA >60 05/15/2019 1658    BNP No results found for: "BNP"  ProBNP No results found for: "PROBNP"  Specialty Problems       Pulmonary Problems   Acute cough   Pleural effusion    No Known  Allergies   There is no immunization history on file for this patient.  Past Medical History:  Diagnosis Date   Acute idiopathic pericarditis    Heart valve insufficiency    Lupus (systemic lupus erythematosus) (HCC)    Pericardial effusion - resolved    Pleural effusion     Tobacco History: Social History   Tobacco Use  Smoking Status Never  Smokeless Tobacco Current  Tobacco Comments   Pt smokes hooka 1-2 times per month PAP 12/18/2021   Ready to quit: Not Answered Counseling given: Not Answered Tobacco comments: Pt smokes hooka 1-2 times per month PAP 12/18/2021   Continue to not smoke  Outpatient Encounter Medications as of 01/20/2022  Medication Sig   colchicine 0.6 MG tablet Take 1 tablet (0.6 mg total) by mouth 2 (two) times daily.   hydroxychloroquine (PLAQUENIL) 200 MG tablet Take 200 mg by mouth 2 (two) times daily.   naproxen sodium (ALEVE) 220 MG tablet Take 220 mg by mouth 2 (two) times daily with a meal.   pantoprazole (PROTONIX) 40 MG tablet Take 1 tablet (40 mg total) by mouth daily.   [DISCONTINUED] albuterol (PROVENTIL HFA;VENTOLIN HFA) 108 (90 Base) MCG/ACT inhaler Inhale 2 puffs into the lungs every 4 (four) hours as needed for shortness of breath (cough).   No facility-administered encounter medications on file as of 01/20/2022.     Review of Systems  Review of Systems  N/a Physical Exam  BP 122/64 (BP Location: Left Arm, Patient Position: Sitting, Cuff Size: Normal)   Pulse 64   Temp 98.7 F (37.1 C) (Oral)   Wt 196 lb 12.8 oz (89.3 kg)   SpO2 98%   BMI 31.76 kg/m   Wt Readings from Last 5 Encounters:  01/20/22 196 lb 12.8 oz (89.3 kg)  12/18/21 190 lb (86.2 kg)  12/13/21 188 lb 6.4 oz (85.5 kg)  11/20/21 182 lb 3.2 oz (82.6 kg)  11/13/21 179 lb 6.4 oz (81.4 kg)    BMI Readings from Last 5 Encounters:  01/20/22 31.76 kg/m  12/18/21 30.67 kg/m  12/13/21 30.41 kg/m  11/20/21 29.41 kg/m  11/13/21 28.96 kg/m     Physical  Exam General: Sitting in chair, no acute distress Eyes: EOMI, no icterus Neck: Supple, no JVP Pulmonary: Breath sounds clear and equal bilaterally Cardiovascular: Warm, no edema Abdomen: Nondistended, bowel sounds present MSK: No synovitis, no joint effusion Neuro: Normal gait, no weakness Psych: Normal mood, full affect  Assessment & Plan:   Pleural effusions: Initially left greater than right and tiny, left seems to resolved on subsequent chest imaging with increasing size of right.  Previous ultrasound in the office demonstrated mall but tappable pocket.  Decreased significantly in size 1 week later at time of thoracentesis.  Exam is clear today.  Chest x-ray today to establish new baseline, would expect to see minimal to no effusions based on exam and recent ultrasound.  Lupus: Recent diagnosis.  On Plaquenil.  Likely contributing to prior pericardial and pleural effusions.  Symptoms seem well controlled.  Encouraged ongoing follow-up with rheumatology.  Return if symptoms worsen or fail to improve.   Karren Burly, MD 01/20/2022

## 2022-01-20 NOTE — Patient Instructions (Signed)
Nice to see you again  Your lungs sound clear, I do not hear any fluid.  We will repeat a chest x-ray today just to establish a new baseline and make sure the fluid is gone.  Return to clinic as needed based on results of chest x-ray

## 2022-01-22 ENCOUNTER — Telehealth (HOSPITAL_BASED_OUTPATIENT_CLINIC_OR_DEPARTMENT_OTHER): Payer: Self-pay | Admitting: Family

## 2022-01-22 NOTE — Telephone Encounter (Signed)
Spoke with patient regarding new appointemnt date and time----

## 2022-01-22 NOTE — Telephone Encounter (Signed)
Spoke with patient regarding new appointment date and time Friday 02/07/22 at 10:55 am with Gillian Shields, NP. (Original appt was 01/24/22 provider not in office ).  WIll mail information to patient and she voiced her understanding

## 2022-01-22 NOTE — Progress Notes (Signed)
Chest xray shows improvement which was expected

## 2022-01-24 ENCOUNTER — Ambulatory Visit (HOSPITAL_BASED_OUTPATIENT_CLINIC_OR_DEPARTMENT_OTHER): Payer: Medicaid Other | Admitting: Family

## 2022-02-07 ENCOUNTER — Ambulatory Visit (HOSPITAL_BASED_OUTPATIENT_CLINIC_OR_DEPARTMENT_OTHER): Payer: Medicaid Other | Admitting: Family

## 2022-02-07 ENCOUNTER — Encounter (HOSPITAL_BASED_OUTPATIENT_CLINIC_OR_DEPARTMENT_OTHER): Payer: Self-pay | Admitting: Family

## 2022-02-07 VITALS — BP 118/64 | HR 77 | Ht 66.0 in | Wt 197.6 lb

## 2022-02-07 DIAGNOSIS — Z8679 Personal history of other diseases of the circulatory system: Secondary | ICD-10-CM

## 2022-02-07 DIAGNOSIS — I340 Nonrheumatic mitral (valve) insufficiency: Secondary | ICD-10-CM | POA: Diagnosis not present

## 2022-02-07 DIAGNOSIS — M329 Systemic lupus erythematosus, unspecified: Secondary | ICD-10-CM | POA: Diagnosis not present

## 2022-02-07 NOTE — Patient Instructions (Addendum)
Medication Instructions:  Your physician has recommended you make the following change in your medication:   STOP Protonix - may take as needed for indigestion  Try an antihistamine for anti inflammatory for hoarseness.  *If you need a refill on your cardiac medications before your next appointment, please call your pharmacy*   Lab Work/Testing/Procedures: Your physician has requested that you have an echocardiogram 12/2022. Echocardiography is a painless test that uses sound waves to create images of your heart. It provides your doctor with information about the size and shape of your heart and how well your heart's chambers and valves are working. This procedure takes approximately one hour. There are no restrictions for this procedure. Please do NOT wear cologne, perfume, aftershave, or lotions (deodorant is allowed). Please arrive 15 minutes prior to your appointment time.    Follow-Up: At [facility], you and your health needs are our priority.  As part of our continuing mission to provide you with exceptional heart care, we have created designated Provider Care Teams.  These Care Teams include your primary Cardiologist (physician) and Advanced Practice Providers (APPs -  Physician Assistants and Nurse Practitioners) who all work together to provide you with the care you need, when you need it.  We recommend signing up for the patient portal called "MyChart".  Sign up information is provided on this After Visit Summary.  MyChart is used to connect with patients for Virtual Visits (Telemedicine).  Patients are able to view lab/test results, encounter notes, upcoming appointments, etc.  Non-urgent messages can be sent to your provider as well.   To learn more about what you can do with MyChart, go to [email or web address].    Your next appointment:   6 month(s)  The format for your next appointment:   In Person  Provider:   Kirtland Bouchard [place] Hilty, MD or Gillian Shields, NP     Other Instructions  Heart Healthy Diet Recommendations: A low-salt diet is recommended. Meats should be grilled, baked, or boiled. Avoid fried foods. Focus on lean protein sources like fish or chicken with vegetables and fruits. The American Heart Association is a Chief Technology Officer!  American Heart Association Diet and Lifeystyle Recommendations   Exercise recommendations: The American Heart Association recommends 150 minutes of moderate intensity exercise weekly. Try 30 minutes of moderate intensity exercise 4-5 times per week. This could include walking, jogging, or swimming.   Important Information About Sugar

## 2022-02-07 NOTE — Progress Notes (Unsigned)
Office Visit    Patient Name: Tiffany Herrera Date of Encounter: 02/08/2022  PCP:  Georganna Skeans, MD   [facility]  Cardiologist:  Chrystie Nose, MD  Advanced Practice Provider:  No care team member to display Electrophysiologist:  None      Chief Complaint    Tiffany Herrera is a 31 y.o. female presents today for pericarditis follow up.    Past Medical History    Past Medical History:  Diagnosis Date   Acute idiopathic pericarditis    Heart valve insufficiency    Lupus (systemic lupus erythematosus) (HCC)    Pericardial effusion - resolved    Pleural effusion    No past surgical history on file.  Allergies  No Known Allergies  History of Present Illness    Tiffany Herrera is a 31 y.o. female with a hx of pericarditis secondary to SLE, pericardial effusion, mitral regurgitation, pulmonic regurgitation last seen 12/13/21.  Presented to ED 10/31/21 with chest pain associated with fevers, sore throat, aches, malaise. Echo LVEF 65-70%, gr1 DD, small circumferetnial pericardial effusion without tamponade. Repeat echo 3 days later LVEF 60-65%, small pericardial effusion, trivial MR, trivial TR. Treated with pednisone, colchicine, ibuprofen. She subsequently underwent rheumatology evaluation and was diagnosed with lupus. Last seen 12/13/21 doing well since initiation of hydroxychloroquine.   She presents today for follow up.  Stopped her colchicine earlier this week with no recurrent chest pain. 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 Was supposed to have thoracentesis with pulmonary, but fluid self resolved. She feels more raspy, hoarse which occurs when she is out with friends or has had a drink. No difficulty swallowing, wheeze, dyspnea.    EKGs/Labs/Other Studies Reviewed:   The following studies were reviewed today:  Echo 12/12/21   1. Left ventricular ejection fraction, by estimation, is 60 to 65%. The   left ventricle has normal function. The left ventricle has no regional  wall motion abnormalities. Left ventricular diastolic parameters were  normal.   2. Right ventricular systolic function is normal. The right ventricular  size is normal.   3. The mitral valve is normal in structure. Mild mitral valve  regurgitation. No evidence of mitral stenosis.   4. The aortic valve is normal in structure. Aortic valve regurgitation is  not visualized. No aortic stenosis is present.   5. Pulmonic valve regurgitation is moderate.   6. The inferior vena cava is normal in size with greater than 50%  respiratory variability, suggesting right atrial pressure of 3 mmHg.   Comparison(s): No significant pericardial effusion identified.   EKG:  EKG is not ordered today.    Recent Labs: 05/28/2021: TSH 0.874 11/03/2021: ALT 20 11/22/2021: BUN 16; Creatinine, Ser 0.81; Hemoglobin 12.2; Magnesium 1.7; Platelets 284; Potassium 4.1; Sodium 136  Recent Lipid Panel    Component Value Date/Time   CHOL 124 05/28/2021 1515   TRIG 156 (H) 05/28/2021 1515   HDL 28 (L) 05/28/2021 1515   CHOLHDL 4.4 05/28/2021 1515   LDLCALC 69 05/28/2021 1515   Home Medications   Current Meds  Medication Sig   hydroxychloroquine (PLAQUENIL) 200 MG tablet Take 200 mg by mouth 2 (two) times daily.   [DISCONTINUED] pantoprazole (PROTONIX) 40 MG tablet Take 1 tablet (40 mg total) by mouth daily.    Review of Systems      All other systems reviewed and are otherwise negative except as noted above.  Physical Exam  VS:  BP 118/64   Pulse 77   Ht 5\' 6"  (1.676 m)   Wt 197 lb 9.6 oz (89.6 kg)   BMI 31.89 kg/m  , BMI Body mass index is 31.89 kg/m.  Wt Readings from Last 3 Encounters:  02/07/22 197 lb 9.6 oz (89.6 kg)  01/20/22 196 lb 12.8 oz (89.3 kg)  12/18/21 190 lb (86.2 kg)     GEN: Well nourished, well developed, in no acute distress. HEENT: normal. Neck: Supple, no JVD, carotid bruits, or masses. Cardiac:  RRR, no murmurs, rubs, or gallops. No clubbing, cyanosis, edema.  Radials/PT 2+ and equal bilaterally.  Respiratory:  Respirations regular and unlabored, clear to auscultation bilaterally. GI: Soft, nontender, nondistended. MS: No deformity or atrophy. Skin: Warm and dry, no rash. Neuro:  Strength and sensation are intact. Psych: Normal affect.  Assessment & Plan    Hx of pericarditis - Admission 10/2021 in setting of SLE flare. Management of SLE, below. Completed colchicine earlier this week, no recurrent chest pain. May discontinue protonix as she was only on for GI protection with NSAID. No hx of GERD. She will contact 11/2021 if she has recurrent chest discomfort.  Valvular heart disease - Echo 12/12/21 mild MR, moderate pulmonic regurgitation. Update echo 12/2022 for monitoring. No evidence of volume overload.  SLE - Managed by rheumatology.       Disposition: Follow up in 1 year(s) with 01/2023, MD or APP.  Signed, Chrystie Nose, NP 02/08/2022, 2:06 PM [facility]

## 2022-02-08 ENCOUNTER — Encounter (HOSPITAL_BASED_OUTPATIENT_CLINIC_OR_DEPARTMENT_OTHER): Payer: Self-pay | Admitting: Family

## 2022-02-10 ENCOUNTER — Telehealth: Payer: Self-pay | Admitting: Internal Medicine

## 2022-02-10 DIAGNOSIS — M79673 Pain in unspecified foot: Secondary | ICD-10-CM

## 2022-02-10 DIAGNOSIS — M79643 Pain in unspecified hand: Secondary | ICD-10-CM

## 2022-02-10 NOTE — Telephone Encounter (Signed)
Pt c/o medication issue:  1. Name of Medication:  Colchicine  2. How are you currently taking this medication (dosage and times per day)? 0.6 mg 2 x daily   3. Are you having a reaction (difficulty breathing--STAT)? Yes.   4. What is your medication issue?  Pt states that she was prescribed this at her last visit and says that she is now having pain and swelling in hand and feet and would like to discuss this. Please advise.

## 2022-02-10 NOTE — Telephone Encounter (Addendum)
Pain in foot, hand, and chest when first prescribed Colchicine  Completed Colchicine and pain in had and foot has returned Patient stated pain was in hand, foot, and chest when she had pericarditis and is concerned coming back.   Discussed with Dr Duke Salvia and she would like for patient to have sed rate  Advised patient, verbalized understanding

## 2022-02-14 NOTE — Telephone Encounter (Signed)
Returned call to patient and provided the following recommendations. Patient denies any chest discomfort, but states the hand  and foot pain is better but still somewhat there.     "Sed rate not yet collected, please remind of labs.  If she is still having chest discomfort, recommend ibuprofen 600mg  twice daily for 3 days.   , NP"

## 2022-02-14 NOTE — Telephone Encounter (Signed)
Sed rate not yet collected, please remind of labs.  If she is still having chest discomfort, recommend ibuprofen 600mg  twice daily for 3 days.  , NP

## 2022-03-29 ENCOUNTER — Emergency Department (HOSPITAL_BASED_OUTPATIENT_CLINIC_OR_DEPARTMENT_OTHER): Payer: Medicaid Other

## 2022-03-29 ENCOUNTER — Encounter (HOSPITAL_BASED_OUTPATIENT_CLINIC_OR_DEPARTMENT_OTHER): Payer: Self-pay

## 2022-03-29 ENCOUNTER — Other Ambulatory Visit: Payer: Self-pay

## 2022-03-29 DIAGNOSIS — R0781 Pleurodynia: Secondary | ICD-10-CM | POA: Insufficient documentation

## 2022-03-29 DIAGNOSIS — J9801 Acute bronchospasm: Secondary | ICD-10-CM | POA: Diagnosis not present

## 2022-03-29 DIAGNOSIS — R0602 Shortness of breath: Secondary | ICD-10-CM | POA: Diagnosis present

## 2022-03-29 LAB — BASIC METABOLIC PANEL
Anion gap: 7 (ref 5–15)
BUN: 18 mg/dL (ref 6–20)
CO2: 25 mmol/L (ref 22–32)
Calcium: 8.8 mg/dL — ABNORMAL LOW (ref 8.9–10.3)
Chloride: 99 mmol/L (ref 98–111)
Creatinine, Ser: 0.87 mg/dL (ref 0.44–1.00)
GFR, Estimated: >60 mL/min (ref >60)
Glucose, Bld: 99 mg/dL (ref 70–99)
Potassium: 3.4 mmol/L — ABNORMAL LOW (ref 3.5–5.1)
Sodium: 131 mmol/L — ABNORMAL LOW (ref 135–145)

## 2022-03-29 LAB — CBC
HCT: 36.7 % (ref 36.0–46.0)
Hemoglobin: 12 g/dL (ref 12.0–15.0)
MCH: 26.8 pg (ref 26.0–34.0)
MCHC: 32.7 g/dL (ref 30.0–36.0)
MCV: 81.9 fL (ref 80.0–100.0)
Platelets: 279 10*3/uL (ref 150–400)
RBC: 4.48 MIL/uL (ref 3.87–5.11)
RDW: 13.3 % (ref 11.5–15.5)
WBC: 3.5 10*3/uL — ABNORMAL LOW (ref 4.0–10.5)
nRBC: 0 % (ref 0.0–0.2)

## 2022-03-29 LAB — TROPONIN I (HIGH SENSITIVITY): Troponin I (High Sensitivity): 6 ng/L (ref <18)

## 2022-03-29 LAB — HCG, SERUM, QUALITATIVE: Preg, Serum: NEGATIVE

## 2022-03-29 NOTE — ED Triage Notes (Signed)
Pt presents with c/o shortness of breath and left upper quadrant pain for 9 hours. Reports she has had this feeling before and was diagnosed with fluid around her lung. Pt states she worked out today and not sure if the pain is associated with that or another episode of the fluid.

## 2022-03-30 ENCOUNTER — Emergency Department (HOSPITAL_BASED_OUTPATIENT_CLINIC_OR_DEPARTMENT_OTHER)
Admission: EM | Admit: 2022-03-30 | Discharge: 2022-03-30 | Disposition: A | Discharge status: Home / Self Care | Payer: Medicaid Other | Attending: Emergency Medicine | Admitting: Emergency Medicine

## 2022-03-30 DIAGNOSIS — J9801 Acute bronchospasm: Secondary | ICD-10-CM

## 2022-03-30 DIAGNOSIS — R0781 Pleurodynia: Secondary | ICD-10-CM

## 2022-03-30 LAB — TROPONIN I (HIGH SENSITIVITY): Troponin I (High Sensitivity): 6 ng/L (ref <18)

## 2022-03-30 MED ORDER — KETOROLAC TROMETHAMINE 15 MG/ML IJ SOLN
15.0000 mg | Freq: Once | INTRAMUSCULAR | Status: AC
  Administered 2022-03-30: 15 mg via INTRAVENOUS
  Filled 2022-03-30: qty 1

## 2022-03-30 MED ORDER — ALBUTEROL SULFATE HFA 108 (90 BASE) MCG/ACT IN AERS
2.0000 | INHALATION_SPRAY | RESPIRATORY_TRACT | Status: DC | PRN
  Administered 2022-03-30: 2 via RESPIRATORY_TRACT
  Filled 2022-03-30: qty 6.7

## 2022-03-30 NOTE — ED Provider Notes (Signed)
[place] [facility] Provider Note: Georgena Spurling, MD, FACEP  CSN: [id number] MRN: [id number] ARRIVAL: 03/29/22 at 2119 ROOM: [place]  Shortness of Breath   HISTORY OF PRESENT ILLNESS  03/30/22 1:52 AM Tiffany Herrera is a 32 y.o. female with a history of lupus.  She is here with short of breath since yesterday about noon.  She feels it is difficult to pull air in sometimes.  She is also having left lower anterior chest pain which she describes is neither sharp nor dull.  It is worse with deep breathing.  She rates it as an 8 out of 10.  She states it feels like when she had a pleural effusion in the past.   Past Medical History:  Diagnosis Date   Acute idiopathic pericarditis    Heart valve insufficiency    Lupus (systemic lupus erythematosus) (HCC)    Pericardial effusion - resolved    Pleural effusion     History reviewed. No pertinent surgical history.  Family History  Problem Relation Age of Onset   Healthy Mother    Healthy Father    Diabetes Maternal Grandmother    Hypertension Maternal Aunt    Diabetes Maternal Aunt    Hypertension Cousin     Social History   Tobacco Use   Smoking status: Never   Smokeless tobacco: Current   Tobacco comments:    Pt smokes hooka 1-2 times per month PAP 12/18/2021  Vaping Use   Vaping Use: Never used  Substance Use Topics   Alcohol use: No   Drug use: No    Prior to Admission medications   Medication Sig Start Date End Date Taking? Authorizing Provider  hydroxychloroquine (PLAQUENIL) 200 MG tablet Take 200 mg by mouth 2 (two) times daily.    [provider]  albuterol (PROVENTIL HFA;VENTOLIN HFA) 108 (90 Base) MCG/ACT inhaler Inhale 2 puffs into the lungs every 4 (four) hours as needed for shortness of breath (cough). 05/14/18 09/21/18  Jola Schmidt, MD    Allergies Patient has no known allergies.   REVIEW OF SYSTEMS  Negative except as noted here or in the History of Present  Illness.   PHYSICAL EXAMINATION  Initial Vital Signs Blood pressure 104/65, pulse 63, temperature 98.2 F (36.8 C), temperature source Oral, resp. rate 14, height 5\' 6"  (1.676 m), weight 84.4 kg, last menstrual period 03/11/2022, SpO2 99 %.  Examination General: Well-developed, well-nourished female in no acute distress; appearance consistent with age of record HENT: normocephalic; atraumatic Eyes: Normal appearance Neck: supple Heart: regular rate and rhythm Lungs: Decreased air movement bilaterally; shallow breaths Chest: Nontender Abdomen: soft; nondistended; nontender; bowel sounds present Extremities: No deformity; full range of motion; pulses normal Neurologic: Awake, alert and oriented; motor function intact in all extremities and symmetric; no facial droop Skin: Warm and dry Psychiatric: Normal mood and affect   RESULTS  Summary of this visit's results, reviewed and interpreted by myself:   EKG Interpretation  Date/Time:  Saturday March 29 2022 21:35:59 EST Ventricular Rate:  72 PR Interval:  209 QRS Duration: 78 QT Interval:  388 QTC Calculation: 425 R Axis:   96 Text Interpretation: Sinus rhythm Borderline prolonged PR interval Borderline right axis deviation Rate is slower Confirmed by Roque Schill [phone]) on 03/30/2022 1:52:09 AM       Laboratory Studies: Results for orders placed or performed during the hospital encounter of 03/30/22 (from the past 24 hour(s))  Basic metabolic panel     Status:  Abnormal   Collection Time: 03/29/22  9:29 PM  Result Value Ref Range   Sodium 131 (L) 135 - 145 mmol/L   Potassium 3.4 (L) 3.5 - 5.1 mmol/L   Chloride 99 98 - 111 mmol/L   CO2 25 22 - 32 mmol/L   Glucose, Bld 99 70 - 99 mg/dL   BUN 18 6 - 20 mg/dL   Creatinine, Ser 0.87 0.44 - 1.00 mg/dL   Calcium 8.8 (L) 8.9 - 10.3 mg/dL   GFR, Estimated >60 >60 mL/min   Anion gap 7 5 - 15  CBC     Status: Abnormal   Collection Time: 03/29/22  9:29 PM  Result Value Ref  Range   WBC 3.5 (L) 4.0 - 10.5 K/uL   RBC 4.48 3.87 - 5.11 MIL/uL   Hemoglobin 12.0 12.0 - 15.0 g/dL   HCT 36.7 36.0 - 46.0 %   MCV 81.9 80.0 - 100.0 fL   MCH 26.8 26.0 - 34.0 pg   MCHC 32.7 30.0 - 36.0 g/dL   RDW 13.3 11.5 - 15.5 %   Platelets 279 150 - 400 K/uL   nRBC 0.0 0.0 - 0.2 %  Troponin I (High Sensitivity)     Status: None   Collection Time: 03/29/22  9:29 PM  Result Value Ref Range   Troponin I (High Sensitivity) 6 <18 ng/L  hCG, serum, qualitative     Status: None   Collection Time: 03/29/22  9:39 PM  Result Value Ref Range   Preg, Serum NEGATIVE NEGATIVE  Troponin I (High Sensitivity)     Status: None   Collection Time: 03/30/22  1:10 AM  Result Value Ref Range   Troponin I (High Sensitivity) 6 <18 ng/L   Imaging Studies: DG Chest 2 View  Result Date: 03/29/2022 CLINICAL DATA:  Chest pain and shortness of breath. EXAM: CHEST - 2 VIEW COMPARISON:  01/20/2022. FINDINGS: The heart size and mediastinal contours are within normal limits. No consolidation, effusion, or pneumothorax. No acute osseous abnormality. IMPRESSION: No active cardiopulmonary disease. Electronically Signed   By: Brett Fairy M.D.   On: 03/29/2022 21:53    ED COURSE and MDM  Nursing notes, initial and subsequent vitals signs, including pulse oximetry, reviewed and interpreted by myself.  Vitals:   03/30/22 0100 03/30/22 0200 03/30/22 0209 03/30/22 0230  BP: 104/65 99/62  99/62  Pulse: 63 60  60  Resp: 14 14  18   Temp: 98.2 F (36.8 C)     TempSrc: Oral     SpO2: 99%  100% 100%  Weight:      Height:       Medications  albuterol (VENTOLIN HFA) 108 (90 Base) MCG/ACT inhaler 2 puff (2 puffs Inhalation Given 03/30/22 0209)  ketorolac (TORADOL) 15 MG/ML injection 15 mg (15 mg Intravenous Given 03/30/22 0243)   3:05 AM Air movement improved after albuterol treatment.  Chest discomfort improved after Toradol.  I suspect the patient is having some acute bronchospasm.  There is no evidence of  pleural effusions.  Her troponins are negative and her EKG is nonischemic.  She was advised to return if symptoms worsen.   PROCEDURES  Procedures   ED DIAGNOSES     ICD-10-CM   1. Acute bronchospasm  J98.01     2. Pleuritic chest pain  R07.81          Shanon Rosser, MD 03/30/22 [phone]

## 2022-03-30 NOTE — ED Notes (Signed)
Pt on monitor 

## 2022-04-01 ENCOUNTER — Telehealth: Payer: Self-pay | Admitting: Internal Medicine

## 2022-04-01 NOTE — Telephone Encounter (Signed)
Pt c/o of Chest Pain: STAT if CP now or developed within 24 hours  1. Are you having CP right now? yes  2. Are you experiencing any other symptoms (ex. SOB, nausea, vomiting, sweating)? Some SOB  3. How long have you been experiencing CP? Since Saturday morning  4. Is your CP continuous or coming and going? Continuous, with deep breaths and movement it gets worse  5. Have you taken Nitroglycerin? No  Patient states she has been having chest pain since Saturday morning. She says she did go to the ED, but the tests came back normal but her chest pain had eased up then. She says she is having chest pain now. ?

## 2022-04-01 NOTE — Telephone Encounter (Signed)
Spoke with pt regarding her chest pain and shortness of breath. Pt states that she feels similar to the way she felt when she has a pericardial effusion back in August 2023. Pt did go to the ED on Saturday with this chest pain and workup was unremarkable. Office visit scheduled for pt. Pt verbalizes understanding.

## 2022-04-02 ENCOUNTER — Ambulatory Visit: Payer: Medicaid Other | Admitting: Nurse Practitioner

## 2022-04-03 ENCOUNTER — Other Ambulatory Visit: Payer: Self-pay | Admitting: Internal Medicine

## 2022-04-03 ENCOUNTER — Encounter: Payer: Self-pay | Admitting: Internal Medicine

## 2022-04-03 ENCOUNTER — Ambulatory Visit: Payer: Medicaid Other | Attending: Internal Medicine

## 2022-04-03 ENCOUNTER — Ambulatory Visit: Payer: Medicaid Other | Attending: Internal Medicine | Admitting: Internal Medicine

## 2022-04-03 VITALS — BP 106/60 | HR 73 | Ht 66.0 in | Wt 190.0 lb

## 2022-04-03 DIAGNOSIS — M329 Systemic lupus erythematosus, unspecified: Secondary | ICD-10-CM

## 2022-04-03 DIAGNOSIS — R0609 Other forms of dyspnea: Secondary | ICD-10-CM

## 2022-04-03 DIAGNOSIS — I3139 Other pericardial effusion (noninflammatory): Secondary | ICD-10-CM

## 2022-04-03 NOTE — Progress Notes (Signed)
OFFICE CONSULT NOTE  Chief Complaint:  Chest pain, shortness of breath  Primary Care Physician: Dorna Mai, MD  HPI:  Tiffany Herrera is a 32 y.o. female who is being seen today for the evaluation of chest pain/dyspnea at the request of Dorna Mai, MD. This is a 32 yo female known to me from recent hospitalization in August 2023 at which time she had presented with pericardial effusion and she was noted to have a very high sedimentation rate.  Subsequently I had diagnosed her with lupus based on an abnormal ANA.  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  She has seen Dr. Lupita Leash.  Her last appointment 1 month of January 8 at which time she was doing well.  Complement levels were noted to be normal.  She was asymptomatic.  She had followed up with [place] several times including an echocardiogram in October 2023 which showed normal LVEF, mild MR and moderate PR but no pericardial effusion.  She had completed a course of colchicine, she had weaned off of NSAIDs and steroids.  She has been maintained on hydroxychloroquine since September.  She was just seen in the emergency department on 03/30/2022 for shortness of breath that started the day before her presentation.  She says it is tough to get air in.  She is reporting left-sided chest pain that was worse with deep breathing.  It was rated as an 8 out of 10 in intensity.  She said it felt like when she had a pleural effusion in the past.  Chest x-ray, however showed no acute disease.  Troponins were negative.  She was given Toradol and encouraged to follow-up with cardiology.   PMHx:  Past Medical History:  Diagnosis Date   Acute idiopathic pericarditis    Heart valve insufficiency    Lupus (systemic lupus erythematosus) (HCC)    Pericardial effusion - resolved    Pleural effusion     History reviewed. No pertinent surgical history.  FAMHx:  Family History  Problem Relation  Age of Onset   Healthy Mother    Healthy Father    Diabetes Maternal Grandmother    Hypertension Maternal Aunt    Diabetes Maternal Aunt    Hypertension Cousin     SOCHx:   reports that she has never smoked. She uses smokeless tobacco. She reports that she does not drink alcohol and does not use drugs.  ALLERGIES:  No Known Allergies  ROS: Pertinent items noted in HPI and remainder of comprehensive ROS otherwise negative.  HOME MEDS: Current Outpatient Medications on File Prior to Visit  Medication Sig Dispense Refill   hydroxychloroquine (PLAQUENIL) 200 MG tablet Take 200 mg by mouth 2 (two) times daily.     [DISCONTINUED] albuterol (PROVENTIL HFA;VENTOLIN HFA) 108 (90 Base) MCG/ACT inhaler Inhale 2 puffs into the lungs every 4 (four) hours as needed for shortness of breath (cough). 1 Inhaler 0   No current facility-administered medications on file prior to visit.    LABS/IMAGING: No results found for this or any previous visit (from the past 48 hour(s)). No results found.  LIPID PANEL:    Component Value Date/Time   CHOL 124 05/28/2021 1515   TRIG 156 (H) 05/28/2021 1515   HDL 28 (L) 05/28/2021 1515   CHOLHDL 4.4 05/28/2021 1515   LDLCALC 69 05/28/2021 1515    WEIGHTS: Wt Readings from Last 3 Encounters:  04/03/22 190 lb (86.2 kg)  03/29/22 186 lb (  84.4 kg)  02/07/22 197 lb 9.6 oz (89.6 kg)    VITALS: BP 106/60   Pulse 73   Ht 5\' 6"  (1.676 m)   Wt 190 lb (86.2 kg)   LMP 03/11/2022 (Approximate)   BMI 30.67 kg/m   EXAM: General appearance: alert and no distress Neck: no carotid bruit, no JVD, and thyroid not enlarged, symmetric, no tenderness/mass/nodules Lungs: clear to auscultation bilaterally Heart: regular rate and rhythm, S1, S2 normal, no murmur, click, rub or gallop Abdomen: soft, non-tender; bowel sounds normal; no masses,  no organomegaly Extremities: extremities normal, atraumatic, no cyanosis or edema Pulses: 2+ and symmetric Skin: Skin  color, texture, turgor normal. No rashes or lesions Neurologic: Grossly normal Psych: Pleasant  EKG: Normal sinus rhythm at 73- personally reviewed  ASSESSMENT: Recently diagnosed SLE History of pericardial effusion secondary to SLE Recurrent shortness of breath and chest pain  PLAN: 1.   Ms. Sigel has recently diagnosed lupus with an associated pericardial effusion.  That had resolved and she completed a course of colchicine and NSAIDs after initial steroid therapy.  She is established with rheumatology at [facility] and had a follow-up appointment just a few weeks ago.  Her complement levels were normal.  She remains on hydroxychloroquine.  She had acute episode of shortness of breath and left lower chest pain and was seen in the emergency department 5 days ago.  Workup was negative including negative troponins and a normal chest x-ray without pleural effusion.  I am concerned that this could be recurrent pericarditis and it would be important to demonstrate that there is an acute inflammatory flare.  Will check a sedimentation rate and CRP as well as a limited echo for pericardial effusion.  I have advised starting anti-inflammatory dose ibuprofen 600 mg 3 times daily until we see her blood work.  If there is some evidence of recurrence, would favor retreatment with colchicine in addition to this.  She should follow-up with her rheumatologist to make sure that her lupus remains quiescent.  Will arrange for follow-up in about 3 to 4 months.  Pixie Casino, MD, [facility], [place] Director of the Advanced Lipid Disorders &  Cardiovascular Risk Reduction Clinic Diplomate of the American Board of Clinical Lipidology Attending Cardiologist  Direct Dial: [phone]  Fax: [phone]  Website:  [email or web address] 04/03/2022, 10:55 AM

## 2022-04-03 NOTE — Patient Instructions (Signed)
Medication Instructions:  Your physician recommends that you continue on your current medications as directed. Please refer to the Current Medication list given to you today.  *If you need a refill on your cardiac medications before your next appointment, please call your pharmacy*   Lab Work: Your physician recommends that you have labs drawn today: Sed rate, CRP, CMET  If you have labs (blood work) drawn today and your tests are completely normal, you will receive your results only by: [place] (if you have MyChart) OR A paper copy in the mail If you have any lab test that is abnormal or we need to change your treatment, we will call you to review the results.   Testing/Procedures: Your physician has requested that you have an limited echocardiogram. Echocardiography is a painless test that uses sound waves to create images of your heart. It provides your doctor with information about the size and shape of your heart and how well your heart's chambers and valves are working. This procedure takes approximately one hour. There are no restrictions for this procedure. Please do NOT wear cologne, perfume, aftershave, or lotions (deodorant is allowed). Please arrive 15 minutes prior to your appointment time.    Follow-Up: At [facility], you and your health needs are our priority.  As part of our continuing mission to provide you with exceptional heart care, we have created designated Provider Care Teams.  These Care Teams include your primary Cardiologist (physician) and Advanced Practice Providers (APPs -  Physician Assistants and Nurse Practitioners) who all work together to provide you with the care you need, when you need it.  We recommend signing up for the patient portal called "MyChart".  Sign up information is provided on this After Visit Summary.  MyChart is used to connect with patients for Virtual Visits (Telemedicine).  Patients are able to view lab/test results,  encounter notes, upcoming appointments, etc.  Non-urgent messages can be sent to your provider as well.   To learn more about what you can do with MyChart, go to [email or web address].    Your next appointment:   3-4 month(s)  Provider:   Pixie Casino, MD

## 2022-04-04 ENCOUNTER — Other Ambulatory Visit: Payer: Self-pay | Admitting: *Deleted

## 2022-04-04 LAB — COMPREHENSIVE METABOLIC PANEL
ALT: 13 IU/L (ref 0–32)
AST: 22 IU/L (ref 0–40)
Albumin/Globulin Ratio: 1 — ABNORMAL LOW (ref 1.2–2.2)
Albumin: 4.1 g/dL (ref 3.9–4.9)
Alkaline Phosphatase: 60 IU/L (ref 44–121)
BUN/Creatinine Ratio: 16 (ref 9–23)
BUN: 13 mg/dL (ref 6–20)
Bilirubin Total: 0.5 mg/dL (ref 0.0–1.2)
CO2: 24 mmol/L (ref 20–29)
Calcium: 10.2 mg/dL (ref 8.7–10.2)
Chloride: 101 mmol/L (ref 96–106)
Creatinine, Ser: 0.79 mg/dL (ref 0.57–1.00)
Globulin, Total: 4.3 g/dL (ref 1.5–4.5)
Glucose: 92 mg/dL (ref 70–99)
Potassium: 4.4 mmol/L (ref 3.5–5.2)
Sodium: 138 mmol/L (ref 134–144)
Total Protein: 8.4 g/dL (ref 6.0–8.5)
eGFR: 102 mL/min/{1.73_m2} (ref >59)

## 2022-04-04 LAB — ECHOCARDIOGRAM LIMITED
Calc EF: 59.3 %
Height: 66 in
S' Lateral: 2.6 cm
Single Plane A2C EF: 59.2 %
Single Plane A4C EF: 56.5 %
Weight: 3040 oz

## 2022-04-04 LAB — SEDIMENTATION RATE: Sed Rate: 55 mm/hr — ABNORMAL HIGH (ref 0–32)

## 2022-04-04 LAB — C-REACTIVE PROTEIN: CRP: 11 mg/L — ABNORMAL HIGH (ref 0–10)

## 2022-04-04 MED ORDER — PREDNISONE 10 MG PO TABS: ORAL_TABLET | ORAL | 0 refills | Status: AC

## 2022-04-09 ENCOUNTER — Ambulatory Visit (INDEPENDENT_AMBULATORY_CARE_PROVIDER_SITE_OTHER): Payer: Medicaid Other

## 2022-04-09 ENCOUNTER — Encounter: Payer: Self-pay | Admitting: Primary Care

## 2022-04-09 ENCOUNTER — Ambulatory Visit: Payer: Medicaid Other | Admitting: Primary Care

## 2022-04-09 VITALS — BP 110/78 | HR 79 | Temp 98.0°F | Ht 66.0 in | Wt 194.4 lb

## 2022-04-09 DIAGNOSIS — Z8709 Personal history of other diseases of the respiratory system: Secondary | ICD-10-CM

## 2022-04-09 DIAGNOSIS — R0781 Pleurodynia: Secondary | ICD-10-CM | POA: Diagnosis not present

## 2022-04-09 NOTE — Patient Instructions (Addendum)
CXR did not show evidence of pneumonia or pleural effusion. You have some linear atelectasis of the left side. Recommend using incentive spirometer, take 5-10 deep breaths hourly while awake   You can get Incentive spirometer on [employer] or [employer]  Continue prednisone taper per cardiology   Follow-up  Dr. Silas Flood in 6 months or sooner if needed    How to Use an Incentive Spirometer An incentive spirometer is a tool that measures how well you are filling your lungs with each breath. Learning to take long, deep breaths using this tool can help you keep your lungs clear and active. This may help to reverse or lessen your chance of developing breathing (pulmonary) problems, especially infection. You may be asked to use a spirometer: After a surgery. If you have a lung problem or a history of smoking. After a long period of time when you have been unable to move or be active. If the spirometer includes an indicator to show the highest number that you have reached, your health care provider or respiratory therapist will help you set a goal. Keep a log of your progress as told by your health care provider. What are the risks? Breathing too quickly may cause dizziness or cause you to pass out. Take your time so you do not get dizzy or light-headed. If you are in pain, you may need to take pain medicine before doing incentive spirometry. It is harder to take a deep breath if you are having pain. How to use your incentive spirometer  Sit up on the edge of your bed or on a chair. Hold the incentive spirometer so that it is in an upright position. Before you use the spirometer, breathe out normally. Place the mouthpiece in your mouth. Make sure your lips are closed tightly around it. Breathe in slowly and as deeply as you can through your mouth, causing the piston or the ball to rise toward the top of the chamber. Hold your breath for 3-5 seconds, or for as long as possible. If the spirometer  includes a coach indicator, use this to guide you in breathing. Slow down your breathing if the indicator goes above the marked areas. Remove the mouthpiece from your mouth and breathe out normally. The piston or ball will return to the bottom of the chamber. Rest for a few seconds, then repeat the steps 10 or more times. Take your time and take a few normal breaths between deep breaths so that you do not get dizzy or light-headed. Do this every 1-2 hours when you are awake. If the spirometer includes a goal marker to show the highest number you have reached (best effort), use this as a goal to work toward during each repetition. After each set of 10 deep breaths, cough a few times. This will help to make sure that your lungs are clear. If you have an incision on your chest or abdomen from surgery, place a pillow or a rolled-up towel firmly against the incision when you cough. This can help to reduce pain while taking deep breaths and coughing. General tips When you are able to get out of bed: Walk around often. Continue to take deep breaths and cough in order to clear your lungs. Keep using the incentive spirometer until your health care provider says it is okay to stop using it. If you have been in the hospital, you may be told to keep using the spirometer at home. Contact a health care provider if: You are having  difficulty using the spirometer. You have trouble using the spirometer as often as instructed. Your pain medicine is not giving enough relief for you to use the spirometer as told. You have a fever. Get help right away if: You develop shortness of breath. You develop a cough with bloody mucus from the lungs. You have fluid or blood coming from an incision site after you cough. Summary An incentive spirometer is a tool that can help you learn to take long, deep breaths to keep your lungs clear and active. You may be asked to use a spirometer after a surgery, if you have a lung  problem or a history of smoking, or if you have been inactive for a long period of time. Use your incentive spirometer as instructed every 1-2 hours while you are awake. If you have an incision on your chest or abdomen, place a pillow or a rolled-up towel firmly against your incision when you cough. This will help to reduce pain. Get help right away if you have shortness of breath, you cough up bloody mucus, or blood comes from your incision when you cough. This information is not intended to replace advice given to you by your health care provider. Make sure you discuss any questions you have with your health care provider. Document Revised: 05/16/2019 Document Reviewed: 05/16/2019 Elsevier Patient Education  [place].

## 2022-04-09 NOTE — Progress Notes (Signed)
@Patient  ID: Tiffany Herrera, female    DOB: [birth year], 32 y.o.   MRN: [id number]  Chief Complaint  Patient presents with   Acute Visit    Pt feels like she has fluid around her lungs again. States she has sob and pain under her left breast/rib area    Referring provider: Dorna Mai, MD  HPI: 32 year old female, never smoked. PMH significant for lupus, pericarditis, pleural effusion, heart valve insufficiency, hyponatremia.  04/09/2022 Patient presents today for acute office visit.  She reports symptoms of shortness of breath and left-sided pleuritic chest pain.  History of pleural effusion. No cough or wheeze. Echocardiogram 04/03/22 showed trivial pericardial, currently on prednisone taper per cardiology. CXR today without pneumonia or pleural effusion.    No Known Allergies   There is no immunization history on file for this patient.  Past Medical History:  Diagnosis Date   Acute idiopathic pericarditis    Heart valve insufficiency    Lupus (systemic lupus erythematosus) (HCC)    Pericardial effusion - resolved    Pleural effusion     Tobacco History: Social History   Tobacco Use  Smoking Status Never  Smokeless Tobacco Current  Tobacco Comments   Pt smokes hooka 1-2 times per month PAP 12/18/2021   Ready to quit: Not Answered Counseling given: Not Answered Tobacco comments: Pt smokes hooka 1-2 times per month PAP 12/18/2021   Outpatient Medications Prior to Visit  Medication Sig Dispense Refill   hydroxychloroquine (PLAQUENIL) 200 MG tablet Take 200 mg by mouth 2 (two) times daily.     predniSONE (DELTASONE) 10 MG tablet Take 4 tablets (40 mg total) by mouth daily with breakfast for 7 days, THEN 3 tablets (30 mg total) daily with breakfast for 7 days, THEN 2 tablets (20 mg total) daily with breakfast for 7 days, THEN 1 tablet (10 mg total) daily with breakfast for 7 days, THEN 0.5 tablets (5 mg total) daily with breakfast for 7 days. 74 tablet 0   No  facility-administered medications prior to visit.   Review of Systems  Review of Systems  Constitutional: Negative.   HENT: Negative.    Respiratory:  Positive for shortness of breath. Negative for cough, chest tightness and wheezing.   Cardiovascular:  Positive for chest pain.    Physical Exam  BP 110/78 (BP Location: Left Arm, Patient Position: Sitting, Cuff Size: Normal)   Pulse 79   Temp 98 F (36.7 C) (Oral)   Ht 5\' 6"  (1.676 m)   Wt 194 lb 6.4 oz (88.2 kg)   LMP 03/11/2022 (Approximate)   SpO2 96%   BMI 31.38 kg/m  Physical Exam Constitutional:      Appearance: Normal appearance.  HENT:     Head: Normocephalic and atraumatic.     Mouth/Throat:     Mouth: Mucous membranes are moist.     Pharynx: Oropharynx is clear.  Cardiovascular:     Rate and Rhythm: Normal rate and regular rhythm.  Pulmonary:     Effort: Pulmonary effort is normal.     Breath sounds: Normal breath sounds. No wheezing, rhonchi or rales.  Skin:    General: Skin is warm and dry.  Neurological:     General: No focal deficit present.     Mental Status: She is alert and oriented to person, place, and time. Mental status is at baseline.  Psychiatric:        Mood and Affect: Mood normal.        Behavior:  Behavior normal.        Thought Content: Thought content normal.        Judgment: Judgment normal.      Lab Results:  CBC    Component Value Date/Time   WBC 3.5 (L) 03/29/2022 2129   RBC 4.48 03/29/2022 2129   HGB 12.0 03/29/2022 2129   HGB 12.1 11/13/2021 1226   HCT 36.7 03/29/2022 2129   HCT 37.9 11/13/2021 1226   PLT 279 03/29/2022 2129   PLT 494 (H) 11/13/2021 1226   MCV 81.9 03/29/2022 2129   MCV 85 11/13/2021 1226   MCH 26.8 03/29/2022 2129   MCHC 32.7 03/29/2022 2129   RDW 13.3 03/29/2022 2129   RDW 13.9 11/13/2021 1226   LYMPHSABS 1.5 10/31/2021 1526   MONOABS 0.6 10/31/2021 1526   EOSABS 0.0 10/31/2021 1526   BASOSABS 0.0 10/31/2021 1526    BMET    Component Value  Date/Time   NA 138 04/03/2022 1105   K 4.4 04/03/2022 1105   CL 101 04/03/2022 1105   CO2 24 04/03/2022 1105   GLUCOSE 92 04/03/2022 1105   GLUCOSE 99 03/29/2022 2129   BUN 13 04/03/2022 1105   CREATININE 0.79 04/03/2022 1105   CALCIUM 10.2 04/03/2022 1105   GFRNONAA >60 03/29/2022 2129   GFRAA >60 05/15/2019 1658    BNP No results found for: "BNP"  ProBNP No results found for: "PROBNP"  Imaging: DG Chest 2 View  Result Date: 04/09/2022 CLINICAL DATA:  Shortness of breath, left sided pleuritic pain EXAM: CHEST - 2 VIEW COMPARISON:  03/29/2022; 01/20/2022; 10/17/2015 FINDINGS: Grossly unchanged borderline enlarged cardiac silhouette. Normal mediastinal contours. Redemonstrated perihilar interstitial opacities. Worsening left basilar/retrocardiac linear heterogeneous opacities. No pleural effusion or pneumothorax. No evidence of edema. No acute osseous abnormalities. IMPRESSION: 1. Redemonstrated perihilar and left basilar linear heterogeneous opacities favored to represent atelectasis. No discrete focal airspace opacities to suggest pneumonia. 2. Redemonstrated borderline cardiomegaly. Electronically Signed   By: Simonne Come M.D.   On: 04/09/2022 10:26   ECHOCARDIOGRAM LIMITED  Result Date: 04/04/2022    ECHOCARDIOGRAM LIMITED REPORT   Patient Name:   Tiffany Herrera Date of Exam: 04/03/2022 Medical Rec #:  [id number]        Height:       66.0 in Accession #:    [id number]       Weight:       190.0 lb Date of Birth:  09-01-1990       BSA:          1.957 m Patient Age:    31 years         BP:           110/62 mmHg Patient Gender: F                HR:           84 bpm. Exam Location:  [facility] Procedure: Limited Echo and Limited Color Doppler Indications:    I31.3 Pericardial effusion (noninflammatory)  History:        Patient has prior history of Echocardiogram examinations, most                 recent 11/04/2021. Pericardial Disease; Risk Factors:Non-Smoker.  Sonographer:    Quentin Ore  RDMS, RVT, RDCS Referring Phys: [id number] Lisette Abu HILTY  Sonographer Comments: This was a limited echo to reassess a known pericardial effusion IMPRESSIONS  1. Left ventricular ejection fraction, by estimation, is 60 to 65%. The left ventricle  has normal function. The left ventricle has no regional wall motion abnormalities. Left ventricular diastolic parameters were normal.  2. Right ventricular systolic function is normal. The right ventricular size is normal. Tricuspid regurgitation signal is inadequate for assessing PA pressure.  3. The mitral valve is normal in structure. No evidence of mitral valve regurgitation. No evidence of mitral stenosis.  4. The aortic valve has an indeterminant number of cusps. Aortic valve regurgitation is not visualized. No aortic stenosis is present.  5. The inferior vena cava is normal in size with greater than 50% respiratory variability, suggesting right atrial pressure of 3 mmHg. FINDINGS  Left Ventricle: Left ventricular ejection fraction, by estimation, is 60 to 65%. The left ventricle has normal function. The left ventricle has no regional wall motion abnormalities. The left ventricular internal cavity size was normal in size. There is  no left ventricular hypertrophy. Left ventricular diastolic parameters were normal. Right Ventricle: The right ventricular size is normal. No increase in right ventricular wall thickness. Right ventricular systolic function is normal. Tricuspid regurgitation signal is inadequate for assessing PA pressure. Left Atrium: Left atrial size was normal in size. Right Atrium: Right atrial size was normal in size. Pericardium: Trivial pericardial effusion is present. Mitral Valve: The mitral valve is normal in structure. No evidence of mitral valve stenosis. Tricuspid Valve: The tricuspid valve is normal in structure. Tricuspid valve regurgitation is not demonstrated. No evidence of tricuspid stenosis. Aortic Valve: The aortic valve has an indeterminant  number of cusps. Aortic valve regurgitation is not visualized. No aortic stenosis is present. Pulmonic Valve: The pulmonic valve was normal in structure. Pulmonic valve regurgitation is not visualized. No evidence of pulmonic stenosis. Aorta: The aortic root is normal in size and structure. Venous: The inferior vena cava is normal in size with greater than 50% respiratory variability, suggesting right atrial pressure of 3 mmHg. IAS/Shunts: No atrial level shunt detected by color flow Doppler. LEFT VENTRICLE PLAX 2D LVIDd:         4.50 cm LVIDs:         2.60 cm LV PW:         0.60 cm LV IVS:        0.80 cm  LV Volumes (MOD) LV vol d, MOD A2C: 90.4 ml LV vol d, MOD A4C: 86.0 ml LV vol s, MOD A2C: 36.9 ml LV vol s, MOD A4C: 37.4 ml LV SV MOD A2C:     53.5 ml LV SV MOD A4C:     86.0 ml LV SV MOD BP:      54.2 ml Ida Rogue MD Electronically signed by Ida Rogue MD Signature Date/Time: 04/04/2022/4:40:17 PM    Final    DG Chest 2 View  Result Date: 03/29/2022 CLINICAL DATA:  Chest pain and shortness of breath. EXAM: CHEST - 2 VIEW COMPARISON:  01/20/2022. FINDINGS: The heart size and mediastinal contours are within normal limits. No consolidation, effusion, or pneumothorax. No acute osseous abnormality. IMPRESSION: No active cardiopulmonary disease. Electronically Signed   By: Brett Fairy M.D.   On: 03/29/2022 21:53     Assessment & Plan:   Pleuritic chest pain - Patient has symptoms dyspnea and left sided pleuritic chest pain. Hx pleural effusion, lupus. Echocardiogram 04/03/22 showed recurrent trivial pericardial effusion, started on prednisone per cardiology. CXR today did not show evidence of pneumonia or pleural effusion. She had some linear atelectasis of the left side. Recommend using incentive spirometer 10x/hour while awake   Follow-up  Dr. Silas Flood  in 6 months or sooner if needed    Martyn Ehrich, NP 04/09/2022

## 2022-04-09 NOTE — Assessment & Plan Note (Addendum)
-  Patient has symptoms dyspnea and left sided pleuritic chest pain. Hx pleural effusion, lupus. Echocardiogram 04/03/22 showed recurrent trivial pericardial effusion, started on prednisone per cardiology. CXR today did not show evidence of pneumonia or pleural effusion. She had some linear atelectasis of the left side. Recommend using incentive spirometer 10x/hour while awake   Follow-up  Dr. Silas Flood in 6 months or sooner if needed

## 2022-04-11 ENCOUNTER — Other Ambulatory Visit (HOSPITAL_COMMUNITY): Payer: Medicaid Other

## 2022-05-20 ENCOUNTER — Telehealth: Payer: Self-pay | Admitting: Internal Medicine

## 2022-05-20 NOTE — Telephone Encounter (Signed)
Patient states Right sided chest pain since Saturday.  Off and on through the day.  Monday she took Aleve and the pain went away for approx. 5-6 hours. No antacids taken.  States when she lays down it moves more to center and she feels SOB. She also feels SOB when she bends over.  She has no reflux or heartburn.  No Left arm pian, no jaw pain, no pain in shoulder blades,  She states this morning she has no pain  but has a headache. No palpitations or flutters. At this time her BP 116/64  HR  77.  She has no other readings.

## 2022-05-20 NOTE — Telephone Encounter (Signed)
Call to patient with message from Dr Debara Pickett    Sounds atypical for cardiac pain - would advise follow-up with PCP if it persists.   Dr Debara Pickett  Patient states understanding.  Will call if any new changes that she feels are heart related.

## 2022-05-20 NOTE — Telephone Encounter (Signed)
Sounds atypical for cardiac pain - would advise follow-up with PCP if it persists.  Dr Debara Pickett

## 2022-05-20 NOTE — Telephone Encounter (Signed)
Pt c/o of Chest Pain: STAT if CP now or developed within 24 hours  1. Are you having CP right now?  Yes   2. Are you experiencing any other symptoms (ex. SOB, nausea, vomiting, sweating)?  SOB, pain when laying down  3. How long have you been experiencing CP?  Started late Saturday night  4. Is your CP continuous or coming and going?  Coming and going  5. Have you taken Nitroglycerin?  No  ?

## 2022-06-18 ENCOUNTER — Other Ambulatory Visit: Payer: Self-pay

## 2022-06-18 ENCOUNTER — Emergency Department (HOSPITAL_BASED_OUTPATIENT_CLINIC_OR_DEPARTMENT_OTHER): Payer: Medicaid Other

## 2022-06-18 ENCOUNTER — Encounter (HOSPITAL_BASED_OUTPATIENT_CLINIC_OR_DEPARTMENT_OTHER): Payer: Self-pay

## 2022-06-18 DIAGNOSIS — E871 Hypo-osmolality and hyponatremia: Secondary | ICD-10-CM | POA: Diagnosis not present

## 2022-06-18 DIAGNOSIS — W57XXXA Bitten or stung by nonvenomous insect and other nonvenomous arthropods, initial encounter: Secondary | ICD-10-CM | POA: Insufficient documentation

## 2022-06-18 DIAGNOSIS — S41152A Open bite of left upper arm, initial encounter: Secondary | ICD-10-CM | POA: Diagnosis not present

## 2022-06-18 DIAGNOSIS — S4992XA Unspecified injury of left shoulder and upper arm, initial encounter: Secondary | ICD-10-CM | POA: Diagnosis present

## 2022-06-18 DIAGNOSIS — D72819 Decreased white blood cell count, unspecified: Secondary | ICD-10-CM | POA: Insufficient documentation

## 2022-06-18 NOTE — ED Triage Notes (Addendum)
Pt noticed yesterday that she might have gotten bit by something on her LT inner bicep. Pt noted to have mild swelling and heat to the area with itchiness. No bite mark or pin point noted to the area. Sensation intact, radial pulse +2, no numbness/tingling. Pt newly dx with Lupus in September 2023 and taking meds; unsure if this is a flare.

## 2022-06-19 ENCOUNTER — Emergency Department (HOSPITAL_BASED_OUTPATIENT_CLINIC_OR_DEPARTMENT_OTHER)
Admission: EM | Admit: 2022-06-19 | Discharge: 2022-06-19 | Disposition: A | Discharge status: Home / Self Care | Payer: Medicaid Other | Attending: Emergency Medicine | Admitting: Emergency Medicine

## 2022-06-19 DIAGNOSIS — S40862A Insect bite (nonvenomous) of left upper arm, initial encounter: Secondary | ICD-10-CM

## 2022-06-19 DIAGNOSIS — D72819 Decreased white blood cell count, unspecified: Secondary | ICD-10-CM

## 2022-06-19 DIAGNOSIS — E871 Hypo-osmolality and hyponatremia: Secondary | ICD-10-CM

## 2022-06-19 LAB — CBC WITH DIFFERENTIAL/PLATELET
Abs Immature Granulocytes: 0.01 10*3/uL (ref 0.00–0.07)
Basophils Absolute: 0 10*3/uL (ref 0.0–0.1)
Basophils Relative: 0 %
Eosinophils Absolute: 0.1 10*3/uL (ref 0.0–0.5)
Eosinophils Relative: 3 %
HCT: 39.5 % (ref 36.0–46.0)
Hemoglobin: 12.8 g/dL (ref 12.0–15.0)
Immature Granulocytes: 0 %
Lymphocytes Relative: 45 %
Lymphs Abs: 1.3 10*3/uL (ref 0.7–4.0)
MCH: 27.3 pg (ref 26.0–34.0)
MCHC: 32.4 g/dL (ref 30.0–36.0)
MCV: 84.2 fL (ref 80.0–100.0)
Monocytes Absolute: 0.5 10*3/uL (ref 0.1–1.0)
Monocytes Relative: 18 %
Neutro Abs: 1 10*3/uL — ABNORMAL LOW (ref 1.7–7.7)
Neutrophils Relative %: 34 %
Platelets: 239 10*3/uL (ref 150–400)
RBC: 4.69 MIL/uL (ref 3.87–5.11)
RDW: 13.3 % (ref 11.5–15.5)
WBC: 3 10*3/uL — ABNORMAL LOW (ref 4.0–10.5)
nRBC: 0 % (ref 0.0–0.2)

## 2022-06-19 LAB — BASIC METABOLIC PANEL
Anion gap: 7 (ref 5–15)
BUN: 16 mg/dL (ref 6–20)
CO2: 26 mmol/L (ref 22–32)
Calcium: 9.3 mg/dL (ref 8.9–10.3)
Chloride: 101 mmol/L (ref 98–111)
Creatinine, Ser: 0.8 mg/dL (ref 0.44–1.00)
GFR, Estimated: >60 mL/min (ref >60)
Glucose, Bld: 85 mg/dL (ref 70–99)
Potassium: 3.7 mmol/L (ref 3.5–5.1)
Sodium: 134 mmol/L — ABNORMAL LOW (ref 135–145)

## 2022-06-19 MED ORDER — DEXAMETHASONE 4 MG PO TABS
10.0000 mg | ORAL_TABLET | Freq: Once | ORAL | Status: AC
  Administered 2022-06-19: 10 mg via ORAL
  Filled 2022-06-19: qty 3

## 2022-06-19 MED ORDER — DIPHENHYDRAMINE HCL 25 MG PO CAPS
25.0000 mg | ORAL_CAPSULE | Freq: Once | ORAL | Status: AC
  Administered 2022-06-19: 25 mg via ORAL
  Filled 2022-06-19: qty 1

## 2022-06-19 NOTE — ED Provider Notes (Signed)
EMERGENCY DEPARTMENT AT [facility] Provider Note   CSN: [id number] Arrival date & time: 06/18/22  2304     History  Chief Complaint  Patient presents with   Arm Swelling    Tiffany Herrera is a 32 y.o. female.  The history is provided by the patient.  She has history of lupus and noted onset yesterday of some pain and itching and swelling of her left upper arm.  She does not recall any trauma or getting bitten.  It is getting worse today.  She denies any difficulty breathing or swallowing.  She has not noted any rash.   Home Medications Prior to Admission medications   Medication Sig Start Date End Date Taking? Authorizing Provider  hydroxychloroquine (PLAQUENIL) 200 MG tablet Take 200 mg by mouth 2 (two) times daily.    [provider]  albuterol (PROVENTIL HFA;VENTOLIN HFA) 108 (90 Base) MCG/ACT inhaler Inhale 2 puffs into the lungs every 4 (four) hours as needed for shortness of breath (cough). 05/14/18 09/21/18  Azalia Bilis, MD      Allergies    Patient has no known allergies.    Review of Systems   Review of Systems  All other systems reviewed and are negative.   Physical Exam Updated Vital Signs BP 112/71 (BP Location: Right Arm)   Pulse (!) 58   Temp 98.7 F (37.1 C) (Oral)   Resp 18   Ht 5\' 6"  (1.676 m)   Wt 81.6 kg   LMP 06/09/2022   SpO2 100%   BMI 29.05 kg/m  Physical Exam Vitals and nursing note reviewed.   32 year old female, resting comfortably and in no acute distress. Vital signs are normal. Oxygen saturation is 100%, which is normal. Head is normocephalic and atraumatic. PERRLA, EOMI.  Lungs are clear without rales, wheezes, or rhonchi. Chest is nontender. Heart has regular rate and rhythm without murmur. Abdomen is soft, flat, nontender. Extremities: There are 3 small erythematous raised lesions of the distal left upper arm medially which have the appearance of insect bites.  There is no fluctuance or  tenderness and no lymphangitic streaks are seen. Skin is warm and dry without other rash. Neurologic: Mental status is normal, cranial nerves are intact, moves all extremities equally.  ED Results / Procedures / Treatments   Labs (all labs ordered are listed, but only abnormal results are displayed) Labs Reviewed  CBC WITH DIFFERENTIAL/PLATELET - Abnormal; Notable for the following components:      Result Value   WBC 3.0 (*)    Neutro Abs 1.0 (*)    All other components within normal limits  BASIC METABOLIC PANEL - Abnormal; Notable for the following components:   Sodium 134 (*)    All other components within normal limits   Radiology DG Humerus Left  Result Date: 06/19/2022 CLINICAL DATA:  Left arm swelling EXAM: LEFT HUMERUS - 2+ VIEW COMPARISON:  None Available. FINDINGS: There is no evidence of fracture or other focal bone lesions. Soft tissues are unremarkable. IMPRESSION: Negative. Electronically Signed   By: Jasmine Pang M.D.   On: 06/19/2022 00:10    Procedures Procedures    Medications Ordered in ED Medications  dexamethasone (DECADRON) tablet 10 mg (has no administration in time range)  diphenhydrAMINE (BENADRYL) capsule 25 mg (has no administration in time range)    ED Course/ Medical Decision Making/ A&P  Medical Decision Making Amount and/or Complexity of Data Reviewed Labs: ordered. Radiology: ordered.   Apparent arthropod bites of the left upper arm.  No evidence of abscess or cellulitis.  No evidence of generalized allergic reaction.  I do not feel this is related to her underlying lupus.  I have reviewed her laboratory test and my interpretation is leukopenia which is unchanged from prior, mild hyponatremia which is not felt to be clinically significant.  Leukopenia is probably related to her known lupus.  Left humerus x-ray is normal.  I have independently viewed the images, and agree with radiologist's interpretation.  I have  ordered a single dose of dexamethasone and diphenhydramine.  I have advised the patient to apply ice, use over-the-counter diphenhydramine and topical hydrocortisone.  I have also recommended use over-the-counter NSAIDs and acetaminophen as needed for pain.  Return precautions discussed.  Final Clinical Impression(s) / ED Diagnoses Final diagnoses:  Insect bite of left upper arm, initial encounter  Leukopenia, unspecified type  Hyponatremia    Rx / DC Orders ED Discharge Orders     None         Dione Booze, MD 06/19/22 0102

## 2022-06-19 NOTE — Discharge Instructions (Signed)
Apply ice as needed.  You may take diphenhydramine (Benadryl) every 4 hours as needed for itching.  You may apply hydrocortisone cream twice a day.  You may take acetaminophen or ibuprofen as needed for pain.  Return if you have any new or concerning symptoms.

## 2022-06-26 IMAGING — CR DG CHEST 2V
2 series · 2 of 2 positions shown · non-contrast
Comparison: 01/13/2021

CLINICAL DATA: Chest pain and shortness of breath.

EXAM:
CHEST - 2 VIEW

[w chest pa]
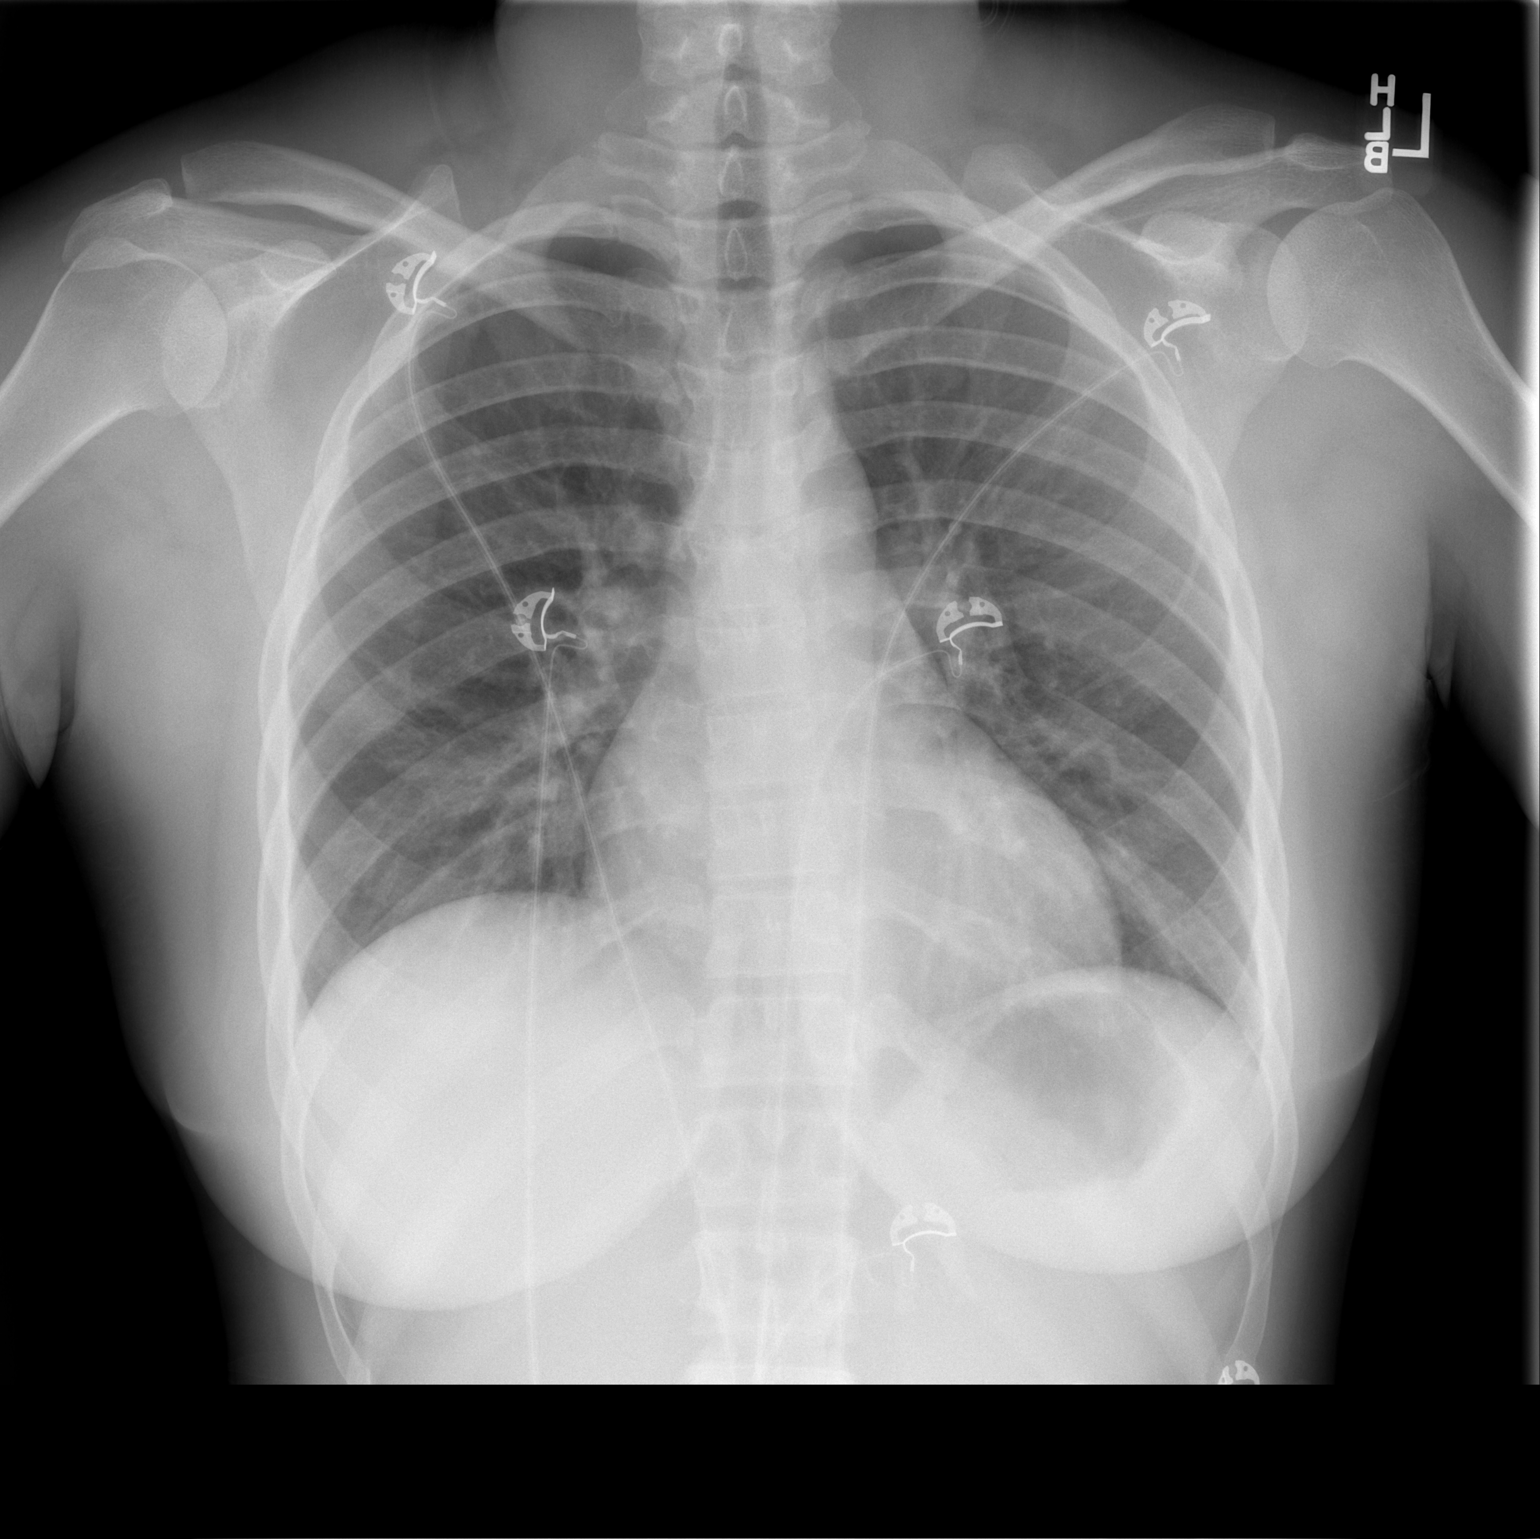

[w chest lat]
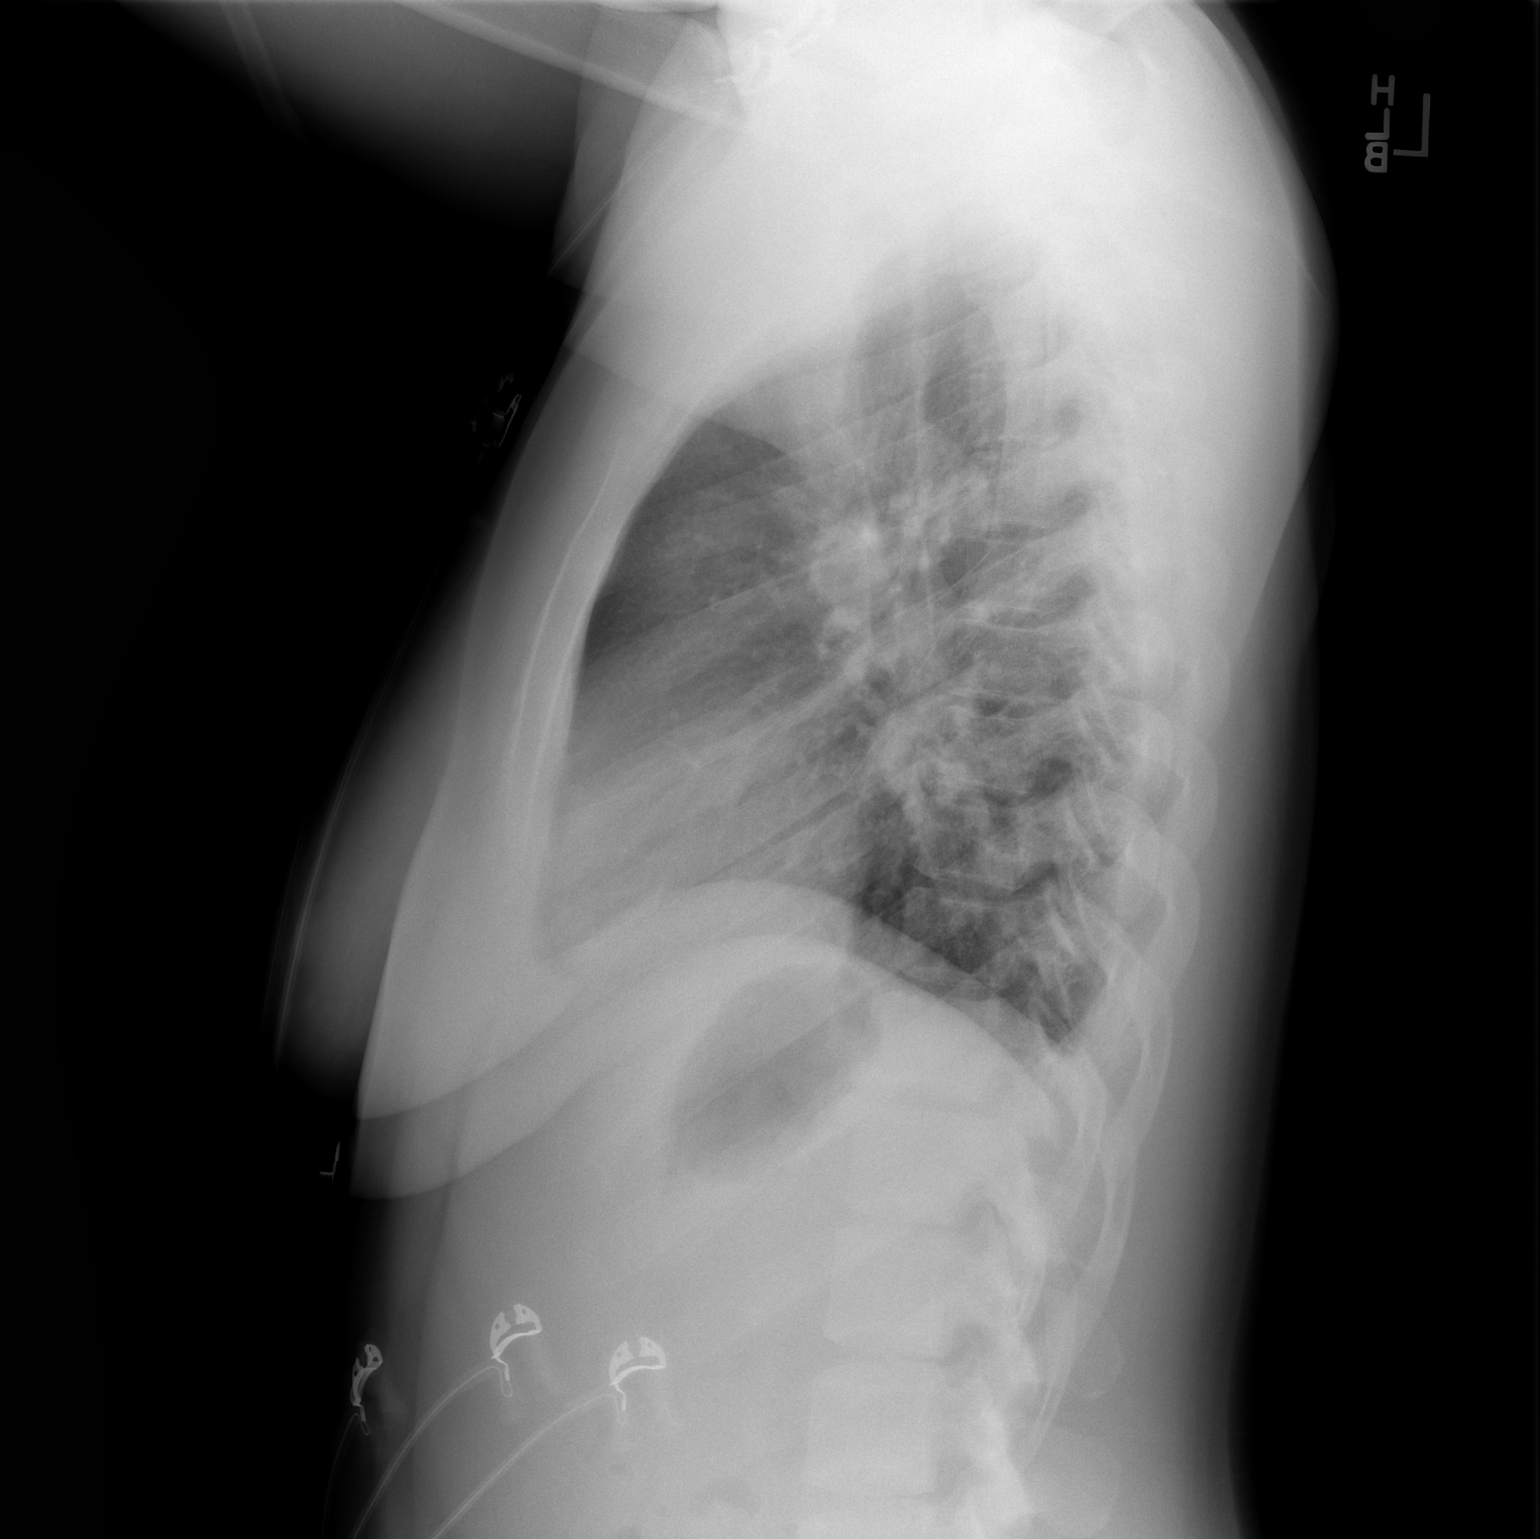

[2 of 2 positions shown; findings below may reference images not displayed]

FINDINGS: Low lung volumes.The cardiomediastinal contours are normal. The
lungs are clear. Pulmonary vasculature is normal. No consolidation,
pleural effusion, or pneumothorax. No acute osseous abnormalities
are seen.
IMPRESSION: Low lung volumes without acute chest finding.

## 2022-07-24 ENCOUNTER — Ambulatory Visit: Payer: Medicaid Other | Attending: Internal Medicine | Admitting: Internal Medicine

## 2022-07-24 ENCOUNTER — Encounter: Payer: Self-pay | Admitting: Internal Medicine

## 2022-07-24 VITALS — BP 118/74 | HR 63 | Ht 66.0 in | Wt 194.6 lb

## 2022-07-24 DIAGNOSIS — R0781 Pleurodynia: Secondary | ICD-10-CM | POA: Diagnosis not present

## 2022-07-24 DIAGNOSIS — M329 Systemic lupus erythematosus, unspecified: Secondary | ICD-10-CM

## 2022-07-24 DIAGNOSIS — I3139 Other pericardial effusion (noninflammatory): Secondary | ICD-10-CM | POA: Diagnosis not present

## 2022-07-24 NOTE — Patient Instructions (Signed)
Medication Instructions:  Continue same medications *If you need a refill on your cardiac medications before your next appointment, please call your pharmacy*   Lab Work: None ordered   Testing/Procedures: None ordered   Follow-Up: At [facility], you and your health needs are our priority.  As part of our continuing mission to provide you with exceptional heart care, we have created designated Provider Care Teams.  These Care Teams include your primary Cardiologist (physician) and Advanced Practice Providers (APPs -  Physician Assistants and Nurse Practitioners) who all work together to provide you with the care you need, when you need it.  We recommend signing up for the patient portal called "MyChart".  Sign up information is provided on this After Visit Summary.  MyChart is used to connect with patients for Virtual Visits (Telemedicine).  Patients are able to view lab/test results, encounter notes, upcoming appointments, etc.  Non-urgent messages can be sent to your provider as well.   To learn more about what you can do with MyChart, go to [email or web address].    Your next appointment:  6 months    Provider:  Dr.Hilty   

## 2022-07-24 NOTE — Progress Notes (Signed)
OFFICE CONSULT NOTE  Chief Complaint:  No complaints  Primary Care Physician: Georganna Skeans, MD  HPI:  Tiffany Herrera is a 32 y.o. female who is being seen today for the evaluation of chest pain/dyspnea at the request of Georganna Skeans, MD. This is a 32 yo female known to me from recent hospitalization in August 2023 at which time she had presented with pericardial effusion and she was noted to have a very high sedimentation rate.  Subsequently I had diagnosed her with lupus based on an abnormal ANA.  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  She has seen Dr. Jonathon Bellows.  Her last appointment 1 month of January 8 at which time she was doing well.  Complement levels were noted to be normal.  She was asymptomatic.  She had followed up with [place] several times including an echocardiogram in October 2023 which showed normal LVEF, mild MR and moderate PR but no pericardial effusion.  She had completed a course of colchicine, she had weaned off of NSAIDs and steroids.  She has been maintained on hydroxychloroquine since September.  She was just seen in the emergency department on 03/30/2022 for shortness of breath that started the day before her presentation.  She says it is tough to get air in.  She is reporting left-sided chest pain that was worse with deep breathing.  It was rated as an 8 out of 10 in intensity.  She said it felt like when she had a pleural effusion in the past.  Chest x-ray, however showed no acute disease.  Troponins were negative.  She was given Toradol and encouraged to follow-up with cardiology.  07/24/2022  Ms. Tarpey returns today for follow-up.  She reports that her chest pain has improved significantly.  She still gets some mild pleuritic chest pain with deep breaths but overall she has no limitations.  She has been on hydroxychloroquine for her lupus.  She is in the process of trying to establish with a rheumatologist  within the [facility].  She was seen by pulmonary also felt she had atypical chest pain likely pleuritic/chest wall.  Repeat echo to follow-up on pericardial effusion is already scheduled in October 2024.   PMHx:  Past Medical History:  Diagnosis Date   Acute idiopathic pericarditis    Heart valve insufficiency    Lupus (systemic lupus erythematosus) (HCC)    Pericardial effusion - resolved    Pleural effusion     No past surgical history on file.  FAMHx:  Family History  Problem Relation Age of Onset   Healthy Mother    Healthy Father    Diabetes Maternal Grandmother    Hypertension Maternal Aunt    Diabetes Maternal Aunt    Hypertension Cousin     SOCHx:   reports that she has never smoked. She uses smokeless tobacco. She reports current alcohol use. She reports that she does not use drugs.  ALLERGIES:  No Known Allergies  ROS: Pertinent items noted in HPI and remainder of comprehensive ROS otherwise negative.  HOME MEDS: Current Outpatient Medications on File Prior to Visit  Medication Sig Dispense Refill   hydroxychloroquine (PLAQUENIL) 200 MG tablet Take 200 mg by mouth 2 (two) times daily.     [DISCONTINUED] albuterol (PROVENTIL HFA;VENTOLIN HFA) 108 (90 Base) MCG/ACT inhaler Inhale 2 puffs into the lungs every 4 (four) hours as needed for shortness of breath (cough). 1 Inhaler 0   No  current facility-administered medications on file prior to visit.    LABS/IMAGING: No results found for this or any previous visit (from the past 48 hour(s)). No results found.  LIPID PANEL:    Component Value Date/Time   CHOL 124 05/28/2021 1515   TRIG 156 (H) 05/28/2021 1515   HDL 28 (L) 05/28/2021 1515   CHOLHDL 4.4 05/28/2021 1515   LDLCALC 69 05/28/2021 1515    WEIGHTS: Wt Readings from Last 3 Encounters:  07/24/22 194 lb 9.6 oz (88.3 kg)  06/18/22 180 lb (81.6 kg)  04/09/22 194 lb 6.4 oz (88.2 kg)    VITALS: BP 118/74   Pulse 63   Ht 5\' 6"  (1.676 m)   Wt  194 lb 9.6 oz (88.3 kg)   LMP 07/10/2022   SpO2 96%   BMI 31.41 kg/m   EXAM: General appearance: alert and no distress Neck: no carotid bruit, no JVD, and thyroid not enlarged, symmetric, no tenderness/mass/nodules Lungs: clear to auscultation bilaterally Heart: regular rate and rhythm, S1, S2 normal, no murmur, click, rub or gallop Abdomen: soft, non-tender; bowel sounds normal; no masses,  no organomegaly Extremities: extremities normal, atraumatic, no cyanosis or edema Pulses: 2+ and symmetric Skin: Skin color, texture, turgor normal. No rashes or lesions Neurologic: Grossly normal Psych: Pleasant  EKG: Sinus rhythm with first-degree AV block at 63-personally reviewed  ASSESSMENT: Recently diagnosed SLE History of pericardial effusion secondary to SLE Recurrent shortness of breath and chest pain  PLAN: 1.   Ms. Goldmann seems to be doing much better.  She recently had steroids for flare of lupus and now is on hydroxychloroquine Cornelius Moras.  She reports no significant chest pain although she does get some occasional pleuritic type pain under the left rib cage.  This may have been due to some adhesions related to pericarditis.  She is try to establish with [facility] rheumatology.  I recommend continue her current therapies.  We have a repeat echo ordered in October and I will plan to see her back after that which is about 6 months.  Chrystie Nose, MD, [facility], FACP  [place]  [facility]  Medical Director of the [facility] Lipid [facility] &  Cardiovascular Risk Reduction Clinic Diplomate of the American Board of Clinical Lipidology Attending Cardiologist  Direct Dial: [phone]  Fax: [phone]  Website:  [email or web address] Nicely Nakia Remmers 07/24/2022, 8:23 AM

## 2022-07-30 ENCOUNTER — Other Ambulatory Visit: Payer: Self-pay

## 2022-07-30 ENCOUNTER — Emergency Department (HOSPITAL_BASED_OUTPATIENT_CLINIC_OR_DEPARTMENT_OTHER)
Admission: EM | Admit: 2022-07-30 | Discharge: 2022-07-30 | Disposition: A | Discharge status: Home / Self Care | Payer: Medicaid Other | Attending: Emergency Medicine | Admitting: Emergency Medicine

## 2022-07-30 ENCOUNTER — Emergency Department (HOSPITAL_BASED_OUTPATIENT_CLINIC_OR_DEPARTMENT_OTHER): Payer: Medicaid Other

## 2022-07-30 ENCOUNTER — Encounter (HOSPITAL_BASED_OUTPATIENT_CLINIC_OR_DEPARTMENT_OTHER): Payer: Self-pay

## 2022-07-30 DIAGNOSIS — R079 Chest pain, unspecified: Secondary | ICD-10-CM | POA: Diagnosis present

## 2022-07-30 DIAGNOSIS — R0789 Other chest pain: Secondary | ICD-10-CM | POA: Insufficient documentation

## 2022-07-30 DIAGNOSIS — R519 Headache, unspecified: Secondary | ICD-10-CM | POA: Diagnosis not present

## 2022-07-30 LAB — CBC
HCT: 40.2 % (ref 36.0–46.0)
Hemoglobin: 13.1 g/dL (ref 12.0–15.0)
MCH: 27.2 pg (ref 26.0–34.0)
MCHC: 32.6 g/dL (ref 30.0–36.0)
MCV: 83.4 fL (ref 80.0–100.0)
Platelets: 265 10*3/uL (ref 150–400)
RBC: 4.82 MIL/uL (ref 3.87–5.11)
RDW: 13 % (ref 11.5–15.5)
WBC: 3.1 10*3/uL — ABNORMAL LOW (ref 4.0–10.5)
nRBC: 0 % (ref 0.0–0.2)

## 2022-07-30 LAB — BASIC METABOLIC PANEL
Anion gap: 6 (ref 5–15)
BUN: 10 mg/dL (ref 6–20)
CO2: 26 mmol/L (ref 22–32)
Calcium: 8.8 mg/dL — ABNORMAL LOW (ref 8.9–10.3)
Chloride: 103 mmol/L (ref 98–111)
Creatinine, Ser: 0.72 mg/dL (ref 0.44–1.00)
GFR, Estimated: >60 mL/min (ref >60)
Glucose, Bld: 89 mg/dL (ref 70–99)
Potassium: 3.7 mmol/L (ref 3.5–5.1)
Sodium: 135 mmol/L (ref 135–145)

## 2022-07-30 LAB — TROPONIN I (HIGH SENSITIVITY): Troponin I (High Sensitivity): 2 ng/L (ref <18)

## 2022-07-30 LAB — PREGNANCY, URINE: Preg Test, Ur: NEGATIVE

## 2022-07-30 MED ORDER — KETOROLAC TROMETHAMINE 30 MG/ML IJ SOLN
30.0000 mg | Freq: Once | INTRAMUSCULAR | Status: AC
  Administered 2022-07-30: 30 mg via INTRAVENOUS
  Filled 2022-07-30: qty 1

## 2022-07-30 NOTE — Discharge Instructions (Addendum)
Thank you for allowing me to be a part of your care today.   Your work-up today is overall reassuring.  Your chest x-ray did not show fluid around your heart or in your lungs.  Your ECG also did not demonstrate heart attack or damage being done to your heart.    I recommend taking 600-800 mg of ibuprofen every 6-8 hours as needed for chest discomfort.  I also recommend following up with your cardiologist if your chest pain continues.   Return to the ED if you develop sudden worsening of your symptoms or if you have any new concerns.

## 2022-07-30 NOTE — ED Triage Notes (Signed)
C/o right sided chest pain and headache since Sunday. States pain worse with certain movements.

## 2022-07-30 NOTE — ED Provider Notes (Signed)
[place] EMERGENCY DEPARTMENT AT [facility] Provider Note   CSN: [id number] Arrival date & time: 07/30/22  6962     History  Chief Complaint  Patient presents with   Chest Pain    Tiffany Herrera is a 32 y.o. female with past medical history significant for SLE, pericardial effusion (resolved), heart valve insufficiency, acute idiopathic pericarditis, pleural effusion presents to the ED complaining of right-sided chest pain since Sunday.  She has taken pain medicine with little relief.  She states that her chest pain is worse with twisting movements, when she lies flat, and when she takes a deep breath.  She states she feels the pain just lateral to the sternum.  Denies recent illness or cough.  She is still taking hydroxyquinoline for lupus.  Patient has been seen previously for chest pain in ED and followed up by cardiology and pulmonology.  She is also complaining of a headache that began yesterday, which she describes as throbbing.  Denies fever, cough, shortness of breath, palpitations, photophobia, visual disturbance, dizziness, lightheadedness, syncope.       Home Medications Prior to Admission medications   Medication Sig Start Date End Date Taking? Authorizing Provider  hydroxychloroquine (PLAQUENIL) 200 MG tablet Take 200 mg by mouth 2 (two) times daily.    [provider]  albuterol (PROVENTIL HFA;VENTOLIN HFA) 108 (90 Base) MCG/ACT inhaler Inhale 2 puffs into the lungs every 4 (four) hours as needed for shortness of breath (cough). 05/14/18 09/21/18  Azalia Bilis, MD      Allergies    Patient has no known allergies.    Review of Systems   Review of Systems  Constitutional:  Negative for fever.  Eyes:  Negative for photophobia and visual disturbance.  Respiratory:  Negative for cough and shortness of breath.   Cardiovascular:  Positive for chest pain. Negative for palpitations.  Neurological:  Positive for headaches. Negative for dizziness,  syncope and light-headedness.    Physical Exam Updated Vital Signs BP 113/76   Pulse 63   Temp 98.8 F (37.1 C) (Oral)   Resp 18   Ht 5\' 6"  (1.676 m)   Wt 85.7 kg   LMP 07/10/2022   SpO2 100%   BMI 30.51 kg/m  Physical Exam Vitals and nursing note reviewed.  Constitutional:      General: She is not in acute distress.    Appearance: Normal appearance. She is not ill-appearing or diaphoretic.  Eyes:     General: Vision grossly intact.     Extraocular Movements: Extraocular movements intact.     Conjunctiva/sclera: Conjunctivae normal.  Cardiovascular:     Rate and Rhythm: Normal rate and regular rhythm.     Pulses: Normal pulses.     Heart sounds: No murmur heard. Pulmonary:     Effort: Pulmonary effort is normal. No accessory muscle usage or respiratory distress.     Breath sounds: Normal breath sounds and air entry.  Chest:     Chest wall: No swelling, tenderness or crepitus.  Musculoskeletal:     Right lower leg: No edema.     Left lower leg: No edema.  Skin:    General: Skin is warm and dry.     Capillary Refill: Capillary refill takes less than 2 seconds.  Neurological:     Mental Status: She is alert and oriented to person, place, and time. Mental status is at baseline.     GCS: GCS eye subscore is 4. GCS verbal subscore is 5. GCS motor  subscore is 6.     Motor: Motor function is intact.  Psychiatric:        Mood and Affect: Mood normal.        Behavior: Behavior normal.     ED Results / Procedures / Treatments   Labs (all labs ordered are listed, but only abnormal results are displayed) Labs Reviewed  BASIC METABOLIC PANEL - Abnormal; Notable for the following components:      Result Value   Calcium 8.8 (*)    All other components within normal limits  CBC - Abnormal; Notable for the following components:   WBC 3.1 (*)    All other components within normal limits  PREGNANCY, URINE  TROPONIN I (HIGH SENSITIVITY)    EKG EKG  Interpretation  Date/Time:  Wednesday Jul 30 2022 09:00:48 EDT Ventricular Rate:  68 PR Interval:  200 QRS Duration: 89 QT Interval:  389 QTC Calculation: 414 R Axis:   93 Text Interpretation: Sinus rhythm Right atrial enlargement Borderline right axis deviation similar to prior No STEMI Confirmed by Theda Belfast ([id number]) on 07/30/2022 9:58:32 AM  Radiology DG Chest 2 View  Result Date: 07/30/2022 CLINICAL DATA:  Chest pain. EXAM: CHEST - 2 VIEW COMPARISON:  April 09, 2022. FINDINGS: The heart size and mediastinal contours are within normal limits. Both lungs are clear. The visualized skeletal structures are unremarkable. IMPRESSION: No active cardiopulmonary disease. Electronically Signed   By: Lupita Raider M.D.   On: 07/30/2022 09:37    Procedures Procedures    Medications Ordered in ED Medications  ketorolac (TORADOL) 30 MG/ML injection 30 mg (30 mg Intravenous Given 07/30/22 6045)    ED Course/ Medical Decision Making/ A&P                             Medical Decision Making Amount and/or Complexity of Data Reviewed Labs: ordered. Radiology: ordered.   This patient presents to the ED with chief complaint(s) of right sided chest pain and headache with pertinent past medical history of SLE, pericardial effusion (resolved), pleural effusion (resolved), idiopathic pericarditis.  The complaint involves an extensive differential diagnosis and also carries with it a high risk of complications and morbidity.    The differential diagnosis includes ACS, pericarditis, pleural effusion, pleuritic chest pain, tension headache, migraine headache, infectious etiology   The initial plan is to obtain chest pain workup  Additional history obtained: Records reviewed  cardiology and pulmonology.  Patient had resolved pericardial effusion and pleural effusion.  Patient's chest pain thought to be pleuritis in nature.  Patient recently saw cardiology on 07/24/22, at which time, patient had  significantly improved chest pain, still having some mild pleuritic chest pain with deep breaths.  Repeat ECHO scheduled for October.  Patient trying to establish with rheumatology.  She is on hydroxychloroquine for lupus.   Initial Assessment:   On exam, patient is a well-appearing 32 year old female who is not in acute distress.  Heart rate is normal in the 70s with regular rhythm.  No appreciable murmurs.  No pedal edema.  Lungs are clear to auscultation bilaterally.  Chest pain is not reproducible with palpation.  Independent ECG/labs interpretation:  The following labs were independently interpreted:  ECG demonstrates sinus rhythm without ectopy, ischemia, or infarction.   Independent visualization and interpretation of imaging: I independently visualized the following imaging with scope of interpretation limited to determining acute life threatening conditions related to emergency care: chest x-ray, which revealed  no pneumonia, pleural effusion, pneumothorax.  Cardiac silhouette is unremarkable.  I agree with radiologist interpretation.    Treatment and Reassessment: Patient given Toradol with improvement in her symptoms.   Disposition:   Patient's workup today is overall very reassuring.  Patient presented with atypical chest pain which may be pleuritic in nature given that is worse with movement and deep breathing.  Patient has had similar pain in the past.  There is no evidence of ACS at this time.  No evidence of pericarditis or pleural effusion.  Patient is feeling improved following Toradol.  Patient is appropriate for discharge home with cardiology and PCP follow-up.  The patient has been appropriately medically screened and/or stabilized in the ED. I have low suspicion for any other emergent medical condition which would require further screening, evaluation or treatment in the ED or require inpatient management. At time of discharge the patient is hemodynamically stable and in no  acute distress. I have discussed work-up results and diagnosis with patient and answered all questions. Patient is agreeable with discharge plan. We discussed strict return precautions for returning to the emergency department and they verbalized understanding.            Final Clinical Impression(s) / ED Diagnoses Final diagnoses:  Atypical chest pain  Bad headache    Rx / DC Orders ED Discharge Orders     None         Lenard Simmer, PA-C 07/30/22 1021    Tegeler, Canary Brim, MD 07/30/22 1513

## 2022-08-12 ENCOUNTER — Ambulatory Visit (HOSPITAL_BASED_OUTPATIENT_CLINIC_OR_DEPARTMENT_OTHER): Payer: Medicaid Other | Admitting: Internal Medicine

## 2022-09-03 ENCOUNTER — Ambulatory Visit: Payer: Self-pay | Admitting: *Deleted

## 2022-09-03 ENCOUNTER — Telehealth: Payer: Self-pay | Admitting: Family Medicine

## 2022-09-03 ENCOUNTER — Telehealth: Payer: Self-pay | Admitting: Internal Medicine

## 2022-09-03 NOTE — Telephone Encounter (Signed)
Please help with referral

## 2022-09-03 NOTE — Telephone Encounter (Signed)
Patient aware she will need to get referral from PCP

## 2022-09-03 NOTE — Telephone Encounter (Signed)
Patient states she has been awaiting a referral  to rheumatology -she is requesting: [facility]  [address] . Patient states this was supposed to be done months ago. Patient advised I would send message to provider- she may require her to be seen if needed.  Reason for Disposition . Requesting regular office appointment  Answer Assessment - Initial Assessment Questions 1. REASON FOR CALL or QUESTION: "What is your reason for calling today?" or "How can I best help you?" or "What question do you have that I can help answer?"     Patient was transferred to nurse triage for pain- but patient states she has pain an declines triage. Patient is calling to request follow up on a referral she previously requested. See note  Protocols used: Information Only Call - No Triage-A-AH

## 2022-09-03 NOTE — Telephone Encounter (Signed)
Patient called to get referral to [facility] at [address], [address], [place], [place] [phone], fax# [phone].

## 2022-09-03 NOTE — Telephone Encounter (Signed)
Patient returned RN's call. 

## 2022-09-03 NOTE — Telephone Encounter (Signed)
Referral Request - Has patient seen PCP for this complaint? yes *If NO, is insurance requiring patient see PCP for this issue before PCP can refer them? Referral for which specialty: rheumatologist Preferred provider/office: ? Reason for referral: pain in left foot/wants to see a different Dr then who shehas been seeing

## 2022-09-03 NOTE — Telephone Encounter (Signed)
LVM to let patient know this type of referral generally comes from PCP and should reach out to them.

## 2022-09-04 NOTE — Telephone Encounter (Signed)
Thanks will let patient know °

## 2022-09-08 ENCOUNTER — Telehealth: Payer: Self-pay | Admitting: Family Medicine

## 2022-09-10 ENCOUNTER — Ambulatory Visit: Payer: Medicaid Other | Admitting: Family

## 2022-09-10 ENCOUNTER — Encounter: Payer: Self-pay | Admitting: Family

## 2022-09-10 VITALS — BP 99/62 | HR 62 | Temp 97.9°F | Ht 66.0 in | Wt 201.6 lb

## 2022-09-10 DIAGNOSIS — D8989 Other specified disorders involving the immune mechanism, not elsewhere classified: Secondary | ICD-10-CM

## 2022-09-10 DIAGNOSIS — M329 Systemic lupus erythematosus, unspecified: Secondary | ICD-10-CM

## 2022-09-10 NOTE — Progress Notes (Signed)
Pt is trying to switch from Dr. Andrey Campanile to Tiffany Herrera.  Pt states she needs a new reff for rheumatologist.

## 2022-09-10 NOTE — Progress Notes (Signed)
Patient ID: Tiffany Herrera, female    DOB: Sep 23, 1990  MRN: [id number]  CC: Follow-Up  Subjective: Tiffany Herrera is a 32 y.o. female who presents for follow-up.   Her concerns today include:  09/03/2022 per triage RN call note: Patient states she has been awaiting a referral  to rheumatology -she is requesting: [facility]  [address] . Patient states this was supposed to be done months ago. Patient advised I would send message to provider- she may require her to be seen if needed.  Reason for Disposition  Requesting regular office appointment  Answer Assessment - Initial Assessment Questions 1. REASON FOR CALL or QUESTION: "What is your reason for calling today?" or "How can I best help you?" or "What question do you have that I can help answer?"     Patient was transferred to nurse triage for pain- but patient states she has pain an declines triage. Patient is calling to request follow up on a referral she previously requested. See note  Protocols used: Information Only Call - No Triage-A-AH  Today's visit 09/10/2022: Reports she was diagnosed with lupus in September 2023. Since then she established care with a rheumatologist in [place]. Reports during that time she was prescribed Hydroxychloroquine and doing well on regimen. Fatigue persisting. Today she requests referral to [facility] located at [address] [place], [address]. No further issues/concerns for discussion today.    Patient Active Problem List   Diagnosis Date Noted   Pleuritic chest pain 04/09/2022   Pleural effusion 12/13/2021   Lupus (systemic lupus erythematosus) (HCC) 12/13/2021   Heart valve insufficiency 12/13/2021   Pericarditis 10/31/2021   Hyponatremia 10/31/2021   Annual physical exam 05/28/2021   Acute cough 05/23/2021     Current Outpatient Medications on File Prior to Visit  Medication Sig Dispense Refill   hydroxychloroquine  (PLAQUENIL) 200 MG tablet Take 200 mg by mouth 2 (two) times daily.     [DISCONTINUED] albuterol (PROVENTIL HFA;VENTOLIN HFA) 108 (90 Base) MCG/ACT inhaler Inhale 2 puffs into the lungs every 4 (four) hours as needed for shortness of breath (cough). 1 Inhaler 0   No current facility-administered medications on file prior to visit.    No Known Allergies  Social History   Socioeconomic History   Marital status: Single    Spouse name: Not on file   Number of children: Not on file   Years of education: Not on file   Highest education level: Not on file  Occupational History   Not on file  Tobacco Use   Smoking status: Never   Smokeless tobacco: Current   Tobacco comments:    Pt smokes hooka 1-2 times per month PAP 12/18/2021  Vaping Use   Vaping Use: Never used  Substance and Sexual Activity   Alcohol use: Yes   Drug use: No   Sexual activity: Yes    Birth control/protection: None  Other Topics Concern   Not on file  Social History Narrative   Not on file   Social Determinants of Health   Financial Resource Strain: Not on file  Food Insecurity: Not on file  Transportation Needs: Not on file  Physical Activity: Not on file  Stress: Not on file  Social Connections: Not on file  Intimate Partner Violence: Not on file    Family History  Problem Relation Age of Onset   Healthy Mother    Healthy Father    Diabetes Maternal Grandmother  Hypertension Maternal Aunt    Diabetes Maternal Aunt    Hypertension Cousin     No past surgical history on file.  ROS: Review of Systems Negative except as stated above  PHYSICAL EXAM: BP 99/62   Pulse 62   Temp 97.9 F (36.6 C) (Oral)   Ht 5\' 6"  (1.676 m)   Wt 201 lb 9.6 oz (91.4 kg)   LMP 09/06/2022 (Exact Date)   SpO2 96%   BMI 32.54 kg/m   Physical Exam HENT:     Head: Normocephalic and atraumatic.     Nose: Nose normal.     Mouth/Throat:     Mouth: Mucous membranes are moist.     Pharynx: Oropharynx is  clear.  Eyes:     Extraocular Movements: Extraocular movements intact.     Conjunctiva/sclera: Conjunctivae normal.     Pupils: Pupils are equal, round, and reactive to light.  Cardiovascular:     Rate and Rhythm: Normal rate and regular rhythm.     Pulses: Normal pulses.     Heart sounds: Normal heart sounds.  Musculoskeletal:        General: Normal range of motion.     Cervical back: Normal range of motion and neck supple.  Neurological:     General: No focal deficit present.     Mental Status: She is alert and oriented to person, place, and time.  Psychiatric:        Mood and Affect: Mood normal.        Behavior: Behavior normal.     ASSESSMENT AND PLAN: 1. Autoimmune disorder (HCC) 2. Systemic lupus erythematosus, unspecified SLE type, unspecified organ involvement status (HCC) - Referral to Rheumatology for further evaluation/management. During the interim follow-up with primary provider as scheduled.  - Ambulatory referral to Rheumatology   Patient was given the opportunity to ask questions.  Patient verbalized understanding of the plan and was able to repeat key elements of the plan. Patient was given clear instructions to go to Emergency Department or return to medical center if symptoms don't improve, worsen, or new problems develop.The patient verbalized understanding.   Orders Placed This Encounter  Procedures   Ambulatory referral to Rheumatology   Follow-up with primary provider as scheduled.   Rema Fendt, NP

## 2022-09-11 ENCOUNTER — Emergency Department (HOSPITAL_BASED_OUTPATIENT_CLINIC_OR_DEPARTMENT_OTHER)
Admission: EM | Admit: 2022-09-11 | Discharge: 2022-09-11 | Disposition: A | Discharge status: Home / Self Care | Payer: Medicaid Other | Attending: Emergency Medicine | Admitting: Emergency Medicine

## 2022-09-11 ENCOUNTER — Encounter (HOSPITAL_BASED_OUTPATIENT_CLINIC_OR_DEPARTMENT_OTHER): Payer: Self-pay | Admitting: Pediatrics

## 2022-09-11 ENCOUNTER — Other Ambulatory Visit: Payer: Self-pay

## 2022-09-11 DIAGNOSIS — K0889 Other specified disorders of teeth and supporting structures: Secondary | ICD-10-CM | POA: Diagnosis present

## 2022-09-11 MED ORDER — PENICILLIN V POTASSIUM 500 MG PO TABS: 500.0000 mg | ORAL_TABLET | Freq: Four times a day (QID) | ORAL | 0 refills | Status: AC

## 2022-09-11 MED ORDER — KETOROLAC TROMETHAMINE 15 MG/ML IJ SOLN
15.0000 mg | Freq: Once | INTRAMUSCULAR | Status: AC
  Administered 2022-09-11: 15 mg via INTRAMUSCULAR
  Filled 2022-09-11: qty 1

## 2022-09-11 NOTE — Discharge Instructions (Signed)
You were seen in the emergency department for dental pain.  As we discussed I think your pain is likely related to a cavity. We normally treat this with anti-inflammatories and antibiotics.   I've attached a resource guide with several dentists in the area. It's incredibly important you follow up with a dentist as soon as possible for definitive treatment.   Continue to monitor how you're doing and return to the ER for new or worsening symptoms such as difficulty swallowing your own saliva, difficulty breathing, or fever.   

## 2022-09-11 NOTE — ED Notes (Signed)
Pt. Reports R upper quadrant pain in her mouth from a tooth.

## 2022-09-11 NOTE — ED Triage Notes (Signed)
C/O right sided mouth pain started yesterday; took some BC powder with min relief.

## 2022-09-11 NOTE — ED Provider Notes (Signed)
[place] [facility] Provider Note   CSN: [id number] Arrival date & time: 09/11/22  1022     History  Chief Complaint  Patient presents with   Dental Pain    Tiffany Herrera is a 32 y.o. female.  Patient with history of lupus presents today with complaints of dental pain.  She states that she first noticed same yesterday.  She thinks she felt a crack in her tooth but is not sure.  Pain is right upper molar.  She has not scheduled an appointment with a dentist yet due to it being a holiday.  She denies fevers, chills, difficulty swallowing, or facial swelling.  The history is provided by the patient. No language interpreter was used.  Dental Pain      Home Medications Prior to Admission medications   Medication Sig Start Date End Date Taking? Authorizing Provider  hydroxychloroquine (PLAQUENIL) 200 MG tablet Take 200 mg by mouth 2 (two) times daily.    [provider]  albuterol (PROVENTIL HFA;VENTOLIN HFA) 108 (90 Base) MCG/ACT inhaler Inhale 2 puffs into the lungs every 4 (four) hours as needed for shortness of breath (cough). 05/14/18 09/21/18  Azalia Bilis, MD      Allergies    Patient has no known allergies.    Review of Systems   Review of Systems  HENT:  Positive for dental problem.   All other systems reviewed and are negative.   Physical Exam Updated Vital Signs BP 110/74 (BP Location: Left Arm)   Pulse 78   Temp 98.6 F (37 C) (Oral)   Resp 18   Ht 5\' 6"  (1.676 m)   Wt 91.4 kg   LMP 09/06/2022 (Exact Date)   SpO2 100%   BMI 32.54 kg/m  Physical Exam Vitals and nursing note reviewed.  Constitutional:      General: She is not in acute distress.    Appearance: Normal appearance. She is normal weight. She is not ill-appearing, toxic-appearing or diaphoretic.  HENT:     Head: Normocephalic and atraumatic.     Mouth/Throat:      Comments: Dentition poor throughout with multiple dental caries.  TTP of the  right upper molar.  No facial swelling, crepitus, or trismus.  No gross abscess or swelling under the tongue, no signs of Ludwig's angina. Cardiovascular:     Rate and Rhythm: Normal rate.  Pulmonary:     Effort: Pulmonary effort is normal. No respiratory distress.  Musculoskeletal:        General: Normal range of motion.     Cervical back: Normal range of motion.  Skin:    General: Skin is warm and dry.  Neurological:     General: No focal deficit present.     Mental Status: She is alert.  Psychiatric:        Mood and Affect: Mood normal.        Behavior: Behavior normal.     ED Results / Procedures / Treatments   Labs (all labs ordered are listed, but only abnormal results are displayed) Labs Reviewed - No data to display  EKG None  Radiology No results found.  Procedures Procedures    Medications Ordered in ED Medications  ketorolac (TORADOL) 15 MG/ML injection 15 mg (has no administration in time range)    ED Course/ Medical Decision Making/ A&P  Medical Decision Making Risk Prescription drug management.   Patient presents today with complaints of dental pain x 1 day.  She is afebrile, nontoxic-appearing, and in no acute distress with reassuring vital signs.  Physical exam reveals multiple dental caries with specific tenderness in the right upper molar per imaging above.  No gross abscess.  Exam unconcerning for Ludwig's angina or spread of infection.  Will treat with penicillin and anti-inflammatories medicine.  Urged patient to follow-up with dentist.  Resources given for same. Evaluation and diagnostic testing in the emergency department does not suggest an emergent condition requiring admission or immediate intervention beyond what has been performed at this time.  Plan for discharge with close PCP follow-up.  Patient is understanding and amenable with plan, educated on red flag symptoms that would prompt immediate return.  Patient  discharged in stable condition.  Final Clinical Impression(s) / ED Diagnoses Final diagnoses:  Pain, dental    Rx / DC Orders ED Discharge Orders          Ordered    penicillin v potassium (VEETID) 500 MG tablet  4 times daily        09/11/22 1208          An After Visit Summary was printed and given to the patient.     Vear Clock 09/11/22 1209    Alvira Monday, MD 09/12/22 [phone]

## 2022-09-18 ENCOUNTER — Encounter: Payer: Self-pay | Admitting: Dermatology

## 2022-09-18 ENCOUNTER — Other Ambulatory Visit: Payer: Self-pay | Admitting: Dermatology

## 2022-09-18 ENCOUNTER — Ambulatory Visit: Payer: Medicaid Other | Admitting: Dermatology

## 2022-09-18 VITALS — BP 104/68 | HR 72

## 2022-09-18 DIAGNOSIS — L700 Acne vulgaris: Secondary | ICD-10-CM | POA: Diagnosis not present

## 2022-09-18 MED ORDER — TRETINOIN 0.025 % EX CREA: TOPICAL_CREAM | CUTANEOUS | 0 refills | Status: DC

## 2022-09-18 MED ORDER — CLINDAMYCIN PHOSPHATE 1 % EX SWAB: CUTANEOUS | 2 refills | Status: DC

## 2022-09-18 MED ORDER — SPIRONOLACTONE 100 MG PO TABS: 100.0000 mg | ORAL_TABLET | Freq: Every day | ORAL | 2 refills | Status: DC

## 2022-09-18 NOTE — Progress Notes (Signed)
   New Patient Visit   Subjective  Tiffany Herrera is a 32 y.o. female who presents for the following: acne on the face for about 2 months. Prior to that she would get an occasional bump around cycle. She washes with Dove sensitive skin soap and sometimes she uses alcohol. She has never had prescription medications for this.   The following portions of the chart were reviewed this encounter and updated as appropriate: medications, allergies, medical history  Review of Systems:  No other skin or systemic complaints except as noted in HPI or Assessment and Plan.  Objective  Well appearing patient in no apparent distress; mood and affect are within normal limits.   A focused examination was performed of the following areas: face  Relevant exam findings are noted in the Assessment and Plan.  Exam: Open comedones and inflammatory papules         Assessment & Plan   1. Acne Vulgaris - Assessment: Patient reports worsening acne over the past two months. Currently utilizing a dose-sensitive skin wash and occasionally using alcohol. No prior prescription treatment for acne. - Plan:   a. Prescribe topical clindamycin swabs for morning application after face wash, followed by moisturizer and sunscreen.    b. Prescribe Tretinoin 0.025% cream for nighttime use, three nights a week. Advise to cease application if irritation occurs, taking a break for a week, then resuming at two nights a week. Emphasize using a pea-sized amount for the entire face, followed by a plain moisturizer.    c. Advise against the use of products containing salicylic acid to avoid irritation.     d. Prescribe oral spironolactone for nighttime use. Instruct patient to discontinue if experiencing lightheadedness or dizziness and to inform the clinic.    e. Advise discontinuing oral spironolactone two months prior to attempting conception.    ** Instruct to discontinue topical tretinoin upon confirmation of  pregnancy.   3. Skincare Recommendations - Assessment: Patient has sensitive skin. - Plan:   a. Recommend using CeraVe or La Roche-Posay face wash and moisturizer for sensitive skin.   b. Provide samples of the recommended products.  4. Follow-up - Plan: Schedule a follow-up appointment in three months to evaluate the effectiveness of the acne treatment and adjust the treatment plan as necessary.    Return in about 3 months (around 12/19/2022) for ACNE.  Owens Shark, CMA, am acting as scribe for [employer], DO.   Documentation: I have reviewed the above documentation for accuracy and completeness, and I agree with the above.  Langston Reusing, DO

## 2022-09-18 NOTE — Patient Instructions (Addendum)
Thank you for visiting my office today. I appreciate your commitment to improving your skin health and am glad we could discuss an effective treatment plan for your acne.  Here are the key instructions and recommendations from our consultation:  - Morning Routine:   - Wash your face with a gentle cleanser.   - Apply clindamycin swabs to the face.   - Follow with your moisturizer and sunscreen.  - Nighttime Routine:   - Apply tretinoin cream 0.025%, starting three nights a week. If irritation occurs, pause for a week and reduce to two nights a week.   - Use only a pea-sized amount for the entire face, followed by a plain moisturizer.   - Avoid products containing salicylic acid with tretinoin.  - Oral Medication:   - Begin taking spironolactone at night. Monitor for any symptoms like lightheadedness or dizziness and report them.  - General Precautions:   - If planning pregnancy, discontinue oral medication two months prior and topical medication upon confirmation of pregnancy.  - Product Recommendations:   - Use CeraVe or La Roche-Posay for face wash and moisturizer. Samples provided.  - Follow-Up:   - Schedule a follow-up appointment in three months to assess progress and make any necessary adjustments to your treatment plan.  Please ensure to follow these instructions carefully and consistently for the best results. If you have any questions or concerns about your regimen, do not hesitate to contact our office.  Looking forward to seeing your progress at our next appointment.      Acne  Treatment Plan: --Neutragena stubborn texture wash -clindamycin swabs apply to face in the morning -tretinoin 0.025% apply to face 3 times a week with moisturizer on top -Spironolactone 100mg  take 1 pill at night    Due to recent changes in healthcare laws, you may see results of your pathology and/or laboratory studies on MyChart before the doctors have had a chance to review them. We  understand that in some cases there may be results that are confusing or concerning to you. Please understand that not all results are received at the same time and often the doctors may need to interpret multiple results in order to provide you with the best plan of care or course of treatment. Therefore, we ask that you please give [place] 2 business days to thoroughly review all your results before contacting the office for clarification. Should we see a critical lab result, you will be contacted sooner.   If You Need Anything After Your Visit  If you have any questions or concerns for your doctor, please call our main line at [phone] If no one answers, please leave a voicemail as directed and we will return your call as soon as possible. Messages left after 4 pm will be answered the following business day.   You may also send [place] a message via MyChart. We typically respond to MyChart messages within 1-2 business days.  For prescription refills, please ask your pharmacy to contact our office. Our fax number is [phone].  If you have an urgent issue when the clinic is closed that cannot wait until the next business day, you can page your doctor at the number below.    Please note that while we do our best to be available for urgent issues outside of office hours, we are not available 24/7.   If you have an urgent issue and are unable to reach [place], you may choose to seek medical care at your doctor's  office, retail clinic, urgent care center, or emergency room.  If you have a medical emergency, please immediately call 911 or go to the emergency department. In the event of inclement weather, please call our main line at [phone] for an update on the status of any delays or closures.  Dermatology Medication Tips: Please keep the boxes that topical medications come in in order to help keep track of the instructions about where and how to use these. Pharmacies typically print the medication  instructions only on the boxes and not directly on the medication tubes.   If your medication is too expensive, please contact our office at [phone] or send [place] a message through MyChart.   We are unable to tell what your co-pay for medications will be in advance as this is different depending on your insurance coverage. However, we may be able to find a substitute medication at lower cost or fill out paperwork to get insurance to cover a needed medication.   If a prior authorization is required to get your medication covered by your insurance company, please allow [place] 1-2 business days to complete this process.  Drug prices often vary depending on where the prescription is filled and some pharmacies may offer cheaper prices.  The website www.goodrx.com contains coupons for medications through different pharmacies. The prices here do not account for what the cost may be with help from insurance (it may be cheaper with your insurance), but the website can give you the price if you did not use any insurance.  - You can print the associated coupon and take it with your prescription to the pharmacy.  - You may also stop by our office during regular business hours and pick up a GoodRx coupon card.  - If you need your prescription sent electronically to a different pharmacy, notify our office through [facility] or by phone at [phone]

## 2022-10-13 ENCOUNTER — Ambulatory Visit
Admission: EM | Admit: 2022-10-13 | Discharge: 2022-10-13 | Disposition: A | Discharge status: Home / Self Care | Payer: Medicaid Other

## 2022-10-13 DIAGNOSIS — L02411 Cutaneous abscess of right axilla: Secondary | ICD-10-CM

## 2022-10-13 MED ORDER — DOXYCYCLINE HYCLATE 100 MG PO CAPS: 100.0000 mg | ORAL_CAPSULE | Freq: Two times a day (BID) | ORAL | 0 refills | Status: AC

## 2022-10-13 NOTE — ED Provider Notes (Signed)
[facility]    CSN: [id number] Arrival date & time: 10/13/22  1059      History   Chief Complaint Chief Complaint  Patient presents with   Abscess    HPI Tiffany Herrera is a 32 y.o. female.   Patient presents today for "cyst" under her right arm that she noticed 2 days ago.  She reports area is getting bigger in size and is painful.  She does not know if the area is red.  Denies any drainage from the area.  No fevers or nausea/vomiting.  No history of similar or MRSA.  Has not taken anything for the pain so far.  Reports she does take prednisone and Plaquenil daily to treat lupus, also recently started on topical clindamycin, spironolactone, and Retin-A for acne.  LMP 10/03/2022 per patient.    Past Medical History:  Diagnosis Date   Acute idiopathic pericarditis    Heart valve insufficiency    Lupus (systemic lupus erythematosus) (HCC)    Pericardial effusion - resolved    Pleural effusion     Patient Active Problem List   Diagnosis Date Noted   Pleuritic chest pain 04/09/2022   Pleural effusion 12/13/2021   Lupus (systemic lupus erythematosus) (HCC) 12/13/2021   Heart valve insufficiency 12/13/2021   Pericarditis 10/31/2021   Hyponatremia 10/31/2021   Annual physical exam 05/28/2021   Acute cough 05/23/2021    History reviewed. No pertinent surgical history.  OB History   No obstetric history on file.      Home Medications    Prior to Admission medications   Medication Sig Start Date End Date Taking? Authorizing Provider  doxycycline (VIBRAMYCIN) 100 MG capsule Take 1 capsule (100 mg total) by mouth 2 (two) times daily for 7 days. 10/13/22 10/20/22 Yes Valentino Nose, NP  predniSONE (DELTASONE) 5 MG tablet Take by mouth. 10/06/22 10/20/22 Yes [provider]  clindamycin (CLEOCIN T) 1 % SWAB Apply to face in the morning 09/18/22   Terri Piedra, DO  hydroxychloroquine (PLAQUENIL) 200 MG tablet Take 200 mg by mouth 2 (two) times  daily.    [provider]  spironolactone (ALDACTONE) 100 MG tablet Take 1 tablet (100 mg total) by mouth daily. 09/18/22   Terri Piedra, DO  tretinoin (RETIN-A) 0.025 % cream Apply a pea size amount at night 09/18/22   Terri Piedra, DO  albuterol (PROVENTIL HFA;VENTOLIN HFA) 108 (90 Base) MCG/ACT inhaler Inhale 2 puffs into the lungs every 4 (four) hours as needed for shortness of breath (cough). 05/14/18 09/21/18  Azalia Bilis, MD    Family History Family History  Problem Relation Age of Onset   Healthy Mother    Healthy Father    Diabetes Maternal Grandmother    Hypertension Maternal Aunt    Diabetes Maternal Aunt    Hypertension Cousin     Social History Social History   Tobacco Use   Smoking status: Never   Smokeless tobacco: Current   Tobacco comments:    Pt smokes hooka 1-2 times per month PAP 12/18/2021  Vaping Use   Vaping status: Never Used  Substance Use Topics   Alcohol use: Yes   Drug use: No     Allergies   Patient has no known allergies.   Review of Systems Review of Systems Per HPI  Physical Exam Triage Vital Signs ED Triage Vitals  Encounter Vitals Group     BP 10/13/22 1112 101/68     Systolic BP Percentile --  Diastolic BP Percentile --      Pulse Rate 10/13/22 1112 63     Resp 10/13/22 1112 16     Temp 10/13/22 1112 98.3 F (36.8 C)     Temp Source 10/13/22 1112 Oral     SpO2 10/13/22 1112 96 %     Weight --      Height --      Head Circumference --      Peak Flow --      Pain Score 10/13/22 1115 8     Pain Loc --      Pain Education --      Exclude from Growth Chart --    No data found.  Updated Vital Signs BP 101/68 (BP Location: Left Arm)   Pulse 63   Temp 98.3 F (36.8 C) (Oral)   Resp 16   LMP  (Within Weeks) Comment: 1 1/2 week  SpO2 96%   Visual Acuity Right Eye Distance:   Left Eye Distance:   Bilateral Distance:    Right Eye Near:   Left Eye Near:    Bilateral Near:     Physical  Exam Vitals and nursing note reviewed.  Constitutional:      General: She is not in acute distress.    Appearance: Normal appearance. She is not toxic-appearing.  HENT:     Mouth/Throat:     Mouth: Mucous membranes are moist.     Pharynx: Oropharynx is clear.  Pulmonary:     Effort: Pulmonary effort is normal. No respiratory distress.  Skin:    General: Skin is warm and dry.     Capillary Refill: Capillary refill takes less than 2 seconds.     Findings: Abscess present.     Comments: Erythematous abscess to right underarm approximately 2 cm x 1 cm; center of abscess is fluctuant with surrounding firm tissue.  No active drainage.  Neurological:     Mental Status: She is alert and oriented to person, place, and time.  Psychiatric:        Behavior: Behavior is cooperative.      UC Treatments / Results  Labs (all labs ordered are listed, but only abnormal results are displayed) Labs Reviewed - No data to display  EKG   Radiology No results found.  Procedures Incision and Drainage  Date/Time: 10/13/2022 1:48 PM  Performed by: Valentino Nose, NP Authorized by: Valentino Nose, NP   Consent:    Consent obtained:  Verbal   Consent given by:  Patient   Risks, benefits, and alternatives were discussed: yes     Risks discussed:  Bleeding, incomplete drainage, pain and infection   Alternatives discussed:  Alternative treatment and observation Universal protocol:    Procedure explained and questions answered to patient or proxy's satisfaction: yes     Patient identity confirmed:  Verbally with patient Location:    Type:  Abscess   Size:  2 cm x 1 cm   Location:  Upper extremity   Upper extremity location:  Arm   Arm location:  R upper arm (right axilla) Pre-procedure details:    Skin preparation:  Povidone-iodine Anesthesia:    Anesthesia method:  Local infiltration   Local anesthetic:  Lidocaine 1% w/o epi Procedure type:    Complexity:  Simple Procedure  details:    Incision types:  Stab incision   Incision depth:  Dermal   Drainage:  Purulent   Drainage amount:  Moderate   Wound treatment:  Wound  left open   Packing materials:  None Post-procedure details:    Procedure completion:  Tolerated well, no immediate complications  (including critical care time)  Medications Ordered in UC Medications - No data to display  Initial Impression / Assessment and Plan / UC Course  I have reviewed the triage vital signs and the nursing notes.  Pertinent labs & imaging results that were available during my care of the patient were reviewed by me and considered in my medical decision making (see chart for details).   Patient is well-appearing, normotensive, afebrile, not tachycardic, not tachypneic, oxygenating well on room air.    1. Abscess of right axilla I&D of abscess as above Wound covered with 4 x 4 gauze and tape Wound care discussed with patient-start twice daily dressing changes at home, start warm compresses Will treat with doxycycline twice daily for 7 days given immunosuppression status Return and ER precautions discussed with patient Work excuse given  The patient was given the opportunity to ask questions.  All questions answered to their satisfaction.  The patient is in agreement to this plan.    Final Clinical Impressions(s) / UC Diagnoses   Final diagnoses:  Abscess of right axilla     Discharge Instructions      We were able to drain a little of the pus from your right underarm today.  Clean the area and change the bandage twice daily at least.  Please keep the area covered when you are not at home.  Start warm compresses 15 minutes on, 45 minutes off every hour while awake to help it to drain a little bit more.   Start taking the doxycycline twice daily for 7 days to treat for underlying infection.  Seek care if the abscess refills or becomes bigger.  If you develop fever, nausea/vomiting, unable to keep fluids down, or  feeling like you have the flu, please go to the emergency room.     ED Prescriptions     Medication Sig Dispense Auth. Provider   doxycycline (VIBRAMYCIN) 100 MG capsule Take 1 capsule (100 mg total) by mouth 2 (two) times daily for 7 days. 14 capsule Valentino Nose, NP      PDMP not reviewed this encounter.   Valentino Nose, NP 10/13/22 1350

## 2022-10-13 NOTE — Discharge Instructions (Addendum)
We were able to drain a little of the pus from your right underarm today.  Clean the area and change the bandage twice daily at least.  Please keep the area covered when you are not at home.  Start warm compresses 15 minutes on, 45 minutes off every hour while awake to help it to drain a little bit more.   Start taking the doxycycline twice daily for 7 days to treat for underlying infection.  Seek care if the abscess refills or becomes bigger.  If you develop fever, nausea/vomiting, unable to keep fluids down, or feeling like you have the flu, please go to the emergency room.

## 2022-10-13 NOTE — ED Triage Notes (Signed)
Pt reports she has an abscess in the right axilla x 2 days.

## 2022-10-21 ENCOUNTER — Ambulatory Visit
Admission: EM | Admit: 2022-10-21 | Discharge: 2022-10-21 | Disposition: A | Discharge status: Home / Self Care | Payer: Medicaid Other | Attending: Internal Medicine | Admitting: Internal Medicine

## 2022-10-21 DIAGNOSIS — M778 Other enthesopathies, not elsewhere classified: Secondary | ICD-10-CM

## 2022-10-21 DIAGNOSIS — L02411 Cutaneous abscess of right axilla: Secondary | ICD-10-CM

## 2022-10-21 MED ORDER — DICLOFENAC SODIUM 3 % EX GEL: 1.0000 g | Freq: Two times a day (BID) | CUTANEOUS | 0 refills | Status: DC | PRN

## 2022-10-21 MED ORDER — DOXYCYCLINE HYCLATE 100 MG PO CAPS: 100.0000 mg | ORAL_CAPSULE | Freq: Two times a day (BID) | ORAL | 0 refills | Status: DC

## 2022-10-21 NOTE — ED Provider Notes (Signed)
[facility]  Note:  This document was prepared using Conservation officer, historic buildings and may include unintentional dictation errors.  MRN: [id number] DOB: 1990-04-24  Subjective:   Tiffany Herrera is a 32 y.o. female presenting for recheck on her right axillary abscess.  Had an incision and drainage procedure 1 week ago with dramatic improvement.  She finished the antibiotic 2 days ago.  Was taking doxycycline.  She has concerns that it is still draining.  The pain is dramatically improved.  She has also had some throbbing internal hand pain of the right hand.  She feels it more prominently over the dorsal aspect of the metacarpals between the first and third fingers.  No fall, trauma, swelling, bruising, warmth, erythema.  Patient does do a lot of work with her right hand as she is predominantly right-handed.  She does use tools as well especially scissors.  Has a history of lupus but reports that this feels different.  No current facility-administered medications for this encounter.  Current Outpatient Medications:    clindamycin (CLEOCIN T) 1 % SWAB, Apply to face in the morning, Disp: 60 each, Rfl: 2   hydroxychloroquine (PLAQUENIL) 200 MG tablet, Take 200 mg by mouth 2 (two) times daily., Disp: , Rfl:    spironolactone (ALDACTONE) 100 MG tablet, Take 1 tablet (100 mg total) by mouth daily., Disp: 30 tablet, Rfl: 2   tretinoin (RETIN-A) 0.025 % cream, Apply a pea size amount at night, Disp: 45 g, Rfl: 0   No Known Allergies  Past Medical History:  Diagnosis Date   Acute idiopathic pericarditis    Heart valve insufficiency    Lupus (systemic lupus erythematosus) (HCC)    Pericardial effusion - resolved    Pleural effusion      History reviewed. No pertinent surgical history.  Family History  Problem Relation Age of Onset   Healthy Mother    Healthy Father    Diabetes Maternal Grandmother    Hypertension Maternal Aunt    Diabetes Maternal Aunt     Hypertension Cousin     Social History   Tobacco Use   Smoking status: Never   Smokeless tobacco: Current   Tobacco comments:    Pt smokes hooka 1-2 times per month PAP 12/18/2021  Vaping Use   Vaping status: Never Used  Substance Use Topics   Alcohol use: Yes   Drug use: No    ROS   Objective:   Vitals: BP 113/69 (BP Location: Right Arm)   Pulse 69   Temp 98.5 F (36.9 C) (Oral)   Resp 16   LMP  (Within Weeks) Comment: 2 1/2 weeks  SpO2 98%   Physical Exam Constitutional:      General: She is not in acute distress.    Appearance: Normal appearance. She is well-developed. She is not ill-appearing, toxic-appearing or diaphoretic.  HENT:     Head: Normocephalic and atraumatic.     Nose: Nose normal.     Mouth/Throat:     Mouth: Mucous membranes are moist.  Eyes:     General: No scleral icterus.       Right eye: No discharge.        Left eye: No discharge.     Extraocular Movements: Extraocular movements intact.  Cardiovascular:     Rate and Rhythm: Normal rate.  Pulmonary:     Effort: Pulmonary effort is normal.  Musculoskeletal:     Right hand: No swelling, deformity, lacerations, tenderness or bony  tenderness. Normal range of motion. Normal strength. Normal sensation. Normal capillary refill.     Comments: No tenderness elicited on exam for the right hand.  Skin:    General: Skin is warm and dry.       Neurological:     General: No focal deficit present.     Mental Status: She is alert and oriented to person, place, and time.  Psychiatric:        Mood and Affect: Mood normal.        Behavior: Behavior normal.     Assessment and Plan :   PDMP not reviewed this encounter.  1. Abscess of right axilla   2. Tendonitis of right hand    Abscess is resolving and I believe she needs an extended course of doxycycline and therefore will use 1 more week.  Discussed wound care including warm compresses.  Suspect tendinitis of the right hand and therefore  recommended conservative management, resting, diclofenac gel.  Patient declined oral steroid and I am in agreement.  Provided her with a work note. Counseled patient on potential for adverse effects with medications prescribed/recommended today, ER and return-to-clinic precautions discussed, patient verbalized understanding.    Wallis Bamberg, PA-C 10/21/22 1626

## 2022-10-21 NOTE — Discharge Instructions (Signed)
Let's use one more round of doxycycline for the infection in the arm pit area. Please change your dressing 3-5 times daily. Do not apply any ointments or creams. Each time you change your dressing, make sure that you are pressing on the wound to get pus to come out.  Try your best to clean the wound with antibacterial soap and warm water. Pat your wound dry and let it air out if possible to make sure it is dry before reapplying another dressing.

## 2022-10-21 NOTE — ED Triage Notes (Signed)
Pt reports abscess in the right axilla x 12 days. Reports she had the abscess lancet 1 week ago and finished antibiotic 2 days ago. Reports creamy white discharge.   Pt reports throbbing pain in the right hand x 1 day. Pt think may be related to Lupus. Denies any injury.

## 2022-11-11 ENCOUNTER — Other Ambulatory Visit (HOSPITAL_COMMUNITY)
Admission: RE | Admit: 2022-11-11 | Discharge: 2022-11-11 | Disposition: A | Discharge status: Home / Self Care | Payer: Medicaid Other | Source: Ambulatory Visit | Attending: Family Medicine | Admitting: Family Medicine

## 2022-11-11 ENCOUNTER — Encounter: Payer: Self-pay | Admitting: Family Medicine

## 2022-11-11 ENCOUNTER — Ambulatory Visit (INDEPENDENT_AMBULATORY_CARE_PROVIDER_SITE_OTHER): Payer: Medicaid Other | Admitting: Family Medicine

## 2022-11-11 VITALS — BP 110/76 | HR 68 | Temp 99.4°F | Ht 66.0 in | Wt 191.4 lb

## 2022-11-11 DIAGNOSIS — Z130 Encounter for screening for diseases of the blood and blood-forming organs and certain disorders involving the immune mechanism: Secondary | ICD-10-CM

## 2022-11-11 DIAGNOSIS — Z0000 Encounter for general adult medical examination without abnormal findings: Secondary | ICD-10-CM

## 2022-11-11 DIAGNOSIS — Z01419 Encounter for gynecological examination (general) (routine) without abnormal findings: Secondary | ICD-10-CM | POA: Insufficient documentation

## 2022-11-11 DIAGNOSIS — Z13220 Encounter for screening for lipoid disorders: Secondary | ICD-10-CM

## 2022-11-11 DIAGNOSIS — Z1159 Encounter for screening for other viral diseases: Secondary | ICD-10-CM | POA: Diagnosis not present

## 2022-11-11 NOTE — Progress Notes (Signed)
Annual Exam Pap Smear sore throat

## 2022-11-11 NOTE — Progress Notes (Signed)
Established Patient Office Visit  Subjective    Patient ID: Tiffany Herrera, female    DOB: [birth year]  Age: 32 y.o. MRN: [id number]  CC:  Chief Complaint  Patient presents with   Annual Exam    Pap smear sore throat    HPI [name] presents for routine annual exam.    Outpatient Encounter Medications as of 11/11/2022  Medication Sig   clindamycin (CLEOCIN T) 1 % SWAB Apply to face in the morning   hydroxychloroquine (PLAQUENIL) 200 MG tablet Take 200 mg by mouth 2 (two) times daily.   Diclofenac Sodium 3 % GEL Apply 1 g topically 2 (two) times daily as needed. (Patient not taking: Reported on 11/11/2022)   doxycycline (VIBRAMYCIN) 100 MG capsule Take 1 capsule (100 mg total) by mouth 2 (two) times daily. (Patient not taking: Reported on 11/11/2022)   spironolactone (ALDACTONE) 100 MG tablet Take 1 tablet (100 mg total) by mouth daily. (Patient not taking: Reported on 11/11/2022)   tretinoin (RETIN-A) 0.025 % cream Apply a pea size amount at night (Patient not taking: Reported on 11/11/2022)   [DISCONTINUED] albuterol (PROVENTIL HFA;VENTOLIN HFA) 108 (90 Base) MCG/ACT inhaler Inhale 2 puffs into the lungs every 4 (four) hours as needed for shortness of breath (cough).   No facility-administered encounter medications on file as of 11/11/2022.    Past Medical History:  Diagnosis Date   Acute idiopathic pericarditis    Heart valve insufficiency    Lupus (systemic lupus erythematosus) (HCC)    Pericardial effusion - resolved    Pleural effusion     No past surgical history on file.  Family History  Problem Relation Age of Onset   Healthy Mother    Healthy Father    Diabetes Maternal Grandmother    Hypertension Maternal Aunt    Diabetes Maternal Aunt    Hypertension Cousin     Social History   Socioeconomic History   Marital status: Single    Spouse name: Not on file   Number of children: Not on file   Years of education: Not on file   Highest education level: Not  on file  Occupational History   Not on file  Tobacco Use   Smoking status: Never   Smokeless tobacco: Current   Tobacco comments:    Pt smokes hooka 1-2 times per month PAP 12/18/2021  Vaping Use   Vaping status: Never Used  Substance and Sexual Activity   Alcohol use: Yes   Drug use: No   Sexual activity: Yes    Birth control/protection: None  Other Topics Concern   Not on file  Social History Narrative   Not on file   Social Determinants of Health   Financial Resource Strain: Low Risk  (09/24/2022)   Received from [facility]   Overall Financial Resource Strain (CARDIA)    Difficulty of Paying Living Expenses: Not hard at all  Food Insecurity: No Food Insecurity (09/24/2022)   Received from [facility]   Hunger Vital Sign    Worried About Running Out of Food in the Last Year: Never true    Ran Out of Food in the Last Year: Never true  Transportation Needs: No Transportation Needs (09/24/2022)   Received from [facility] - Transportation    Lack of Transportation (Medical): No    Lack of Transportation (Non-Medical): No  Physical Activity: Not on file  Stress: Not on file  Social Connections: Unknown (11/28/2021)   Received from [facility],  [facility]   Social Network    Social Network: Not on file  Intimate Partner Violence: Unknown (11/28/2021)   Received from [facility], [facility]   HITS    Physically Hurt: Not on file    Insult or Talk Down To: Not on file    Threaten Physical Harm: Not on file    Scream or Curse: Not on file    Review of Systems  All other systems reviewed and are negative.       Objective    BP 110/76 (BP Location: Right Arm, Patient Position: Sitting, Cuff Size: Normal)   Pulse 68   Temp 99.4 F (37.4 C) (Oral)   Ht 5\' 6"  (1.676 m)   Wt 191 lb 6.4 oz (86.8 kg)   LMP  (Within Weeks)   BMI 30.89 kg/m   Physical Exam Vitals and nursing note reviewed.  Constitutional:      General: She is not in  acute distress. HENT:     Head: Normocephalic and atraumatic.     Right Ear: Tympanic membrane, ear canal and external ear normal.     Left Ear: Tympanic membrane, ear canal and external ear normal.     Nose: Nose normal.     Mouth/Throat:     Mouth: Mucous membranes are moist.     Pharynx: Oropharynx is clear.  Eyes:     Conjunctiva/sclera: Conjunctivae normal.     Pupils: Pupils are equal, round, and reactive to light.  Neck:     Thyroid: No thyromegaly.  Cardiovascular:     Rate and Rhythm: Normal rate and regular rhythm.     Heart sounds: Normal heart sounds. No murmur heard. Pulmonary:     Effort: Pulmonary effort is normal. No respiratory distress.     Breath sounds: Normal breath sounds.  Abdominal:     General: There is no distension.     Palpations: Abdomen is soft. There is no mass.     Tenderness: There is no abdominal tenderness.  Genitourinary:    Exam position: Supine.     Labia:        Right: No lesion.        Left: No lesion.      Vagina: Normal.     Cervix: Normal.     Uterus: Normal.      Adnexa: Right adnexa normal.     Rectum: Normal.  Musculoskeletal:        General: Normal range of motion.     Cervical back: Normal range of motion and neck supple.  Lymphadenopathy:     Lower Body: No right inguinal adenopathy. No left inguinal adenopathy.  Skin:    General: Skin is warm and dry.  Neurological:     General: No focal deficit present.     Mental Status: She is alert and oriented to person, place, and time.  Psychiatric:        Mood and Affect: Mood normal.        Behavior: Behavior normal.         Assessment & Plan:   1. Annual physical exam  - CMP14+EGFR - Cervicovaginal ancillary only  2. Pap smear, as part of routine gynecological examination  - Cytology - PAP  3. Screening for deficiency anemia  - CBC with Differential  4. Screening for lipid disorders  - Lipid Panel  5. Need for hepatitis C screening test  - Hepatitis C  Antibody  No follow-ups on file.   Georganna Skeans  P, MD

## 2022-11-12 ENCOUNTER — Telehealth: Payer: Self-pay | Admitting: Family Medicine

## 2022-11-12 LAB — LIPID PANEL
Chol/HDL Ratio: 3 ratio (ref 0.0–4.4)
Cholesterol, Total: 128 mg/dL (ref 100–199)
HDL: 43 mg/dL (ref >39)
LDL Chol Calc (NIH): 72 mg/dL (ref 0–99)
Triglycerides: 59 mg/dL (ref 0–149)
VLDL Cholesterol Cal: 13 mg/dL (ref 5–40)

## 2022-11-12 LAB — CBC WITH DIFFERENTIAL/PLATELET
Basophils Absolute: 0 10*3/uL (ref 0.0–0.2)
Basos: 0 %
EOS (ABSOLUTE): 0 10*3/uL (ref 0.0–0.4)
Eos: 0 %
Hematocrit: 43.2 % (ref 34.0–46.6)
Hemoglobin: 13.6 g/dL (ref 11.1–15.9)
Immature Grans (Abs): 0 10*3/uL (ref 0.0–0.1)
Immature Granulocytes: 1 %
Lymphocytes Absolute: 0.9 10*3/uL (ref 0.7–3.1)
Lymphs: 22 %
MCH: 27.1 pg (ref 26.6–33.0)
MCHC: 31.5 g/dL (ref 31.5–35.7)
MCV: 86 fL (ref 79–97)
Monocytes Absolute: 0.6 10*3/uL (ref 0.1–0.9)
Monocytes: 15 %
Neutrophils Absolute: 2.4 10*3/uL (ref 1.4–7.0)
Neutrophils: 62 %
Platelets: 274 10*3/uL (ref 150–450)
RBC: 5.01 x10E6/uL (ref 3.77–5.28)
RDW: 13.3 % (ref 11.7–15.4)
WBC: 3.9 10*3/uL (ref 3.4–10.8)

## 2022-11-12 LAB — CMP14+EGFR
ALT: 16 IU/L (ref 0–32)
AST: 17 IU/L (ref 0–40)
Albumin: 4.3 g/dL (ref 3.9–4.9)
Alkaline Phosphatase: 64 IU/L (ref 44–121)
BUN/Creatinine Ratio: 13 (ref 9–23)
BUN: 10 mg/dL (ref 6–20)
Bilirubin Total: 0.7 mg/dL (ref 0.0–1.2)
CO2: 23 mmol/L (ref 20–29)
Calcium: 9.4 mg/dL (ref 8.7–10.2)
Chloride: 98 mmol/L (ref 96–106)
Creatinine, Ser: 0.8 mg/dL (ref 0.57–1.00)
Globulin, Total: 4.4 g/dL (ref 1.5–4.5)
Glucose: 90 mg/dL (ref 70–99)
Potassium: 4.3 mmol/L (ref 3.5–5.2)
Sodium: 137 mmol/L (ref 134–144)
Total Protein: 8.7 g/dL — ABNORMAL HIGH (ref 6.0–8.5)
eGFR: 100 mL/min/{1.73_m2} (ref >59)

## 2022-11-12 LAB — HEPATITIS C ANTIBODY: Hep C Virus Ab: NONREACTIVE

## 2022-11-12 NOTE — Telephone Encounter (Signed)
Pt is calling in because she received her lab results and she wanted to discuss next steps with her PCP, as well as have her answer some questions she has regarding the results. Please follow up with pt.

## 2022-11-13 ENCOUNTER — Other Ambulatory Visit: Payer: Self-pay | Admitting: Family Medicine

## 2022-11-13 LAB — CERVICOVAGINAL ANCILLARY ONLY
Bacterial Vaginitis (gardnerella): POSITIVE — AB
Candida Glabrata: NEGATIVE
Candida Vaginitis: NEGATIVE
Chlamydia: NEGATIVE
Comment: NEGATIVE
Comment: NEGATIVE
Comment: NEGATIVE
Comment: NEGATIVE
Comment: NEGATIVE
Comment: NORMAL
Neisseria Gonorrhea: NEGATIVE
Trichomonas: NEGATIVE

## 2022-11-13 LAB — CYTOLOGY - PAP
Adequacy: ABSENT
Diagnosis: NEGATIVE

## 2022-11-13 MED ORDER — METRONIDAZOLE 500 MG PO TABS: 500.0000 mg | ORAL_TABLET | Freq: Two times a day (BID) | ORAL | 0 refills | Status: AC

## 2022-11-13 NOTE — Telephone Encounter (Signed)
Patient was called and identified. Patient was given lab results and medication has been sent

## 2022-11-13 NOTE — Telephone Encounter (Signed)
Pt is calling in because she had questions about her labs and she hadn't heard back from Dr. Andrey Campanile. Pt is requesting Dr. Tawana Scale nurse give her a call back.

## 2022-11-21 ENCOUNTER — Other Ambulatory Visit: Payer: Self-pay | Admitting: Family Medicine

## 2022-11-21 ENCOUNTER — Telehealth: Payer: Self-pay | Admitting: Family Medicine

## 2022-11-21 MED ORDER — METRONIDAZOLE 500 MG PO TABS: 500.0000 mg | ORAL_TABLET | Freq: Two times a day (BID) | ORAL | 0 refills | Status: AC

## 2022-11-21 NOTE — Telephone Encounter (Signed)
I spoke to Tiffany Herrera and she is not taking the medication due to herr not having any issues at this time

## 2022-11-21 NOTE — Telephone Encounter (Signed)
Pt is calling in because she had a prescription for metroNIDAZOLE (FLAGYL) 500 MG tablet [[id number]] sent in to the pharmacy today, but pt says she already had a prescription for the exact same medication sent in last week. Pt says she is almost finished with her current prescription and wanted to know was she supposed to start the antibiotics again or was it an error somewhere? Please follow up with pt.

## 2022-12-01 ENCOUNTER — Ambulatory Visit: Payer: Medicaid Other | Admitting: Pulmonary Disease

## 2022-12-01 ENCOUNTER — Encounter: Payer: Self-pay | Admitting: Pulmonary Disease

## 2022-12-01 VITALS — BP 98/60 | HR 92 | Ht 66.0 in | Wt 198.4 lb

## 2022-12-01 DIAGNOSIS — J90 Pleural effusion, not elsewhere classified: Secondary | ICD-10-CM | POA: Diagnosis not present

## 2022-12-01 NOTE — Progress Notes (Signed)
@Patient  ID: Tiffany Herrera, female    DOB: October 28, 1990, 32 y.o.   MRN: [id number]  Chief Complaint  Patient presents with   Follow-up    Pt is here for SOB and Lung Fluid F/U visit. Pt states she has no SOB and she feels great.     Referring provider: Georganna Skeans, MD  HPI:   32 y.o. whom are seeing in follow up for evaluation of right pleural effusion.  Most recent pulmonary note Buelah Manis, NP reviewed.  Most recent PCP note reviewed.  Multiple ED notes in interim reviewed.  Most recent cardiology note reviewed.  Most recent rheumatology note 09/2022 [facility] system reviewed.  Overall doing well.  No issues.  Sounds like it interim issues with chest pain in the winter and spring 2024.  Some concern for pericarditis.  Chest images without any sign of pleural effusion etc.  Chest x-ray is clear on multiple images on my review and interpretation.  Over the last several weeks has been doing very well.  Denies any dyspnea.  No chest pain etc.  Continues on Plaquenil.  Transitioning to a rheumatologist in the [facility] health group.   HPI at initial visit: Patient presented to ED 10/2021 with cough and chest pain.  CT scan of the chest was obtained with contrast revealed no infiltrate, no pleural effusion, no PE, pericardial effusion present on my review interpretation.  This prompted echocardiogram which confirmed pericardial effusion.  She was placed on 2-week prednisone.  Also colchicine.  As well as naproxen.  Had a chest x-ray 11/13/2021 that on my review interpretation reveals clear lungs with small, tiny bilateral left greater than right pleural effusions.  Her symptoms improved but then recurred.  Presented to ED 11/22/2021.  There, chest x-ray reveals resolution of left pleural effusion but worsening and increase in size to small to moderate right pleural effusion on my review and interpretation.  In the interim she was diagnosed with lupus via a rheumatologist.  Started Plaquenil.  Overall her  symptoms are much improved.  Chest pain gone.  Dyspnea gone.  Feels back to baseline.  PMH: Lupus Surgical history: Reviewed, she denies any Family history: No significant respiratory illness in first-degree relative Social history: Never smoker, lives in [place] / Pulmonary Flowsheets:   ACT:      No data to display          MMRC:     No data to display          Epworth:      No data to display          Tests:   FENO:  No results found for: "NITRICOXIDE"  PFT:     No data to display          WALK:      No data to display          Imaging: Personally reviewed and as per EMR discussion this note No results found.  Lab Results: Personally reviewed CBC    Component Value Date/Time   WBC 3.9 11/11/2022 1429   WBC 3.1 (L) 07/30/2022 0859   RBC 5.01 11/11/2022 1429   RBC 4.82 07/30/2022 0859   HGB 13.6 11/11/2022 1429   HCT 43.2 11/11/2022 1429   PLT 274 11/11/2022 1429   MCV 86 11/11/2022 1429   MCH 27.1 11/11/2022 1429   MCH 27.2 07/30/2022 0859   MCHC 31.5 11/11/2022 1429   MCHC 32.6 07/30/2022 0859   RDW 13.3  11/11/2022 1429   LYMPHSABS 0.9 11/11/2022 1429   MONOABS 0.5 06/19/2022 0011   EOSABS 0.0 11/11/2022 1429   BASOSABS 0.0 11/11/2022 1429    BMET    Component Value Date/Time   NA 137 11/11/2022 1429   K 4.3 11/11/2022 1429   CL 98 11/11/2022 1429   CO2 23 11/11/2022 1429   GLUCOSE 90 11/11/2022 1429   GLUCOSE 89 07/30/2022 0859   BUN 10 11/11/2022 1429   CREATININE 0.80 11/11/2022 1429   CALCIUM 9.4 11/11/2022 1429   GFRNONAA >60 07/30/2022 0859   GFRAA >60 05/15/2019 1658    BNP No results found for: "BNP"  ProBNP No results found for: "PROBNP"  Specialty Problems       Pulmonary Problems   Acute cough   Pleural effusion   Pleuritic chest pain    No Known Allergies   There is no immunization history on file for this patient.  Past Medical History:  Diagnosis Date   Acute  idiopathic pericarditis    Heart valve insufficiency    Lupus (systemic lupus erythematosus) (HCC)    Pericardial effusion - resolved    Pleural effusion     Tobacco History: Social History   Tobacco Use  Smoking Status Never  Smokeless Tobacco Current  Tobacco Comments   Pt smokes hooka 1-2 times per month PAP 12/18/2021   Ready to quit: Not Answered Counseling given: Not Answered Tobacco comments: Pt smokes hooka 1-2 times per month PAP 12/18/2021   Continue to not smoke  Outpatient Encounter Medications as of 12/01/2022  Medication Sig   hydroxychloroquine (PLAQUENIL) 200 MG tablet Take 200 mg by mouth 2 (two) times daily.   [DISCONTINUED] albuterol (PROVENTIL HFA;VENTOLIN HFA) 108 (90 Base) MCG/ACT inhaler Inhale 2 puffs into the lungs every 4 (four) hours as needed for shortness of breath (cough).   [DISCONTINUED] clindamycin (CLEOCIN T) 1 % SWAB Apply to face in the morning   [DISCONTINUED] Diclofenac Sodium 3 % GEL Apply 1 g topically 2 (two) times daily as needed. (Patient not taking: Reported on 11/11/2022)   [DISCONTINUED] doxycycline (VIBRAMYCIN) 100 MG capsule Take 1 capsule (100 mg total) by mouth 2 (two) times daily. (Patient not taking: Reported on 11/11/2022)   [DISCONTINUED] spironolactone (ALDACTONE) 100 MG tablet Take 1 tablet (100 mg total) by mouth daily. (Patient not taking: Reported on 11/11/2022)   [DISCONTINUED] tretinoin (RETIN-A) 0.025 % cream Apply a pea size amount at night (Patient not taking: Reported on 11/11/2022)   No facility-administered encounter medications on file as of 12/01/2022.     Review of Systems  Review of Systems  N/a Physical Exam  BP 98/60 (BP Location: Left Arm, Cuff Size: Large)   Pulse 92   Ht 5\' 6"  (1.676 m)   Wt 198 lb 6.4 oz (90 kg)   SpO2 99%   BMI 32.02 kg/m   Wt Readings from Last 5 Encounters:  12/01/22 198 lb 6.4 oz (90 kg)  11/11/22 191 lb 6.4 oz (86.8 kg)  09/11/22 201 lb 9.6 oz (91.4 kg)  09/10/22 201 lb 9.6  oz (91.4 kg)  07/30/22 189 lb (85.7 kg)    BMI Readings from Last 5 Encounters:  12/01/22 32.02 kg/m  11/11/22 30.89 kg/m  09/11/22 32.54 kg/m  09/10/22 32.54 kg/m  07/30/22 30.51 kg/m     Physical Exam General: Sitting in chair, no acute distress Eyes: EOMI, no icterus Neck: Supple, no JVP Pulmonary: Breath sounds clear and equal bilaterally Cardiovascular: Warm, no edema Abdomen: Nondistended,  bowel sounds present MSK: No synovitis, no joint effusion Neuro: Normal gait, no weakness Psych: Normal mood, full affect   Assessment & Plan:   Pleural effusions: Rapidly resolved with initiation of Plaquenil.  Exam is clear today.  Multiple chest x-rays in the interim since last visit clear.  No role for further imaging at this time.  Lupus:   On Plaquenil.  Likely contributing to prior pericardial and pleural effusions.  Symptoms seem well controlled.  Encouraged ongoing follow-up with rheumatology.  Return if symptoms worsen or fail to improve.   Karren Burly, MD 12/01/2022

## 2022-12-01 NOTE — Patient Instructions (Signed)
No changes to medication  Lung sounds clear  Return to clinic as needed

## 2022-12-09 ENCOUNTER — Other Ambulatory Visit (HOSPITAL_BASED_OUTPATIENT_CLINIC_OR_DEPARTMENT_OTHER): Payer: Medicaid Other

## 2022-12-22 ENCOUNTER — Ambulatory Visit: Payer: Medicaid Other | Admitting: Dermatology

## 2022-12-24 ENCOUNTER — Other Ambulatory Visit: Payer: Self-pay | Admitting: Dermatology

## 2022-12-24 ENCOUNTER — Ambulatory Visit (HOSPITAL_BASED_OUTPATIENT_CLINIC_OR_DEPARTMENT_OTHER): Payer: Medicaid Other

## 2022-12-24 DIAGNOSIS — Z8679 Personal history of other diseases of the circulatory system: Secondary | ICD-10-CM

## 2022-12-24 DIAGNOSIS — I340 Nonrheumatic mitral (valve) insufficiency: Secondary | ICD-10-CM | POA: Diagnosis not present

## 2022-12-24 DIAGNOSIS — M329 Systemic lupus erythematosus, unspecified: Secondary | ICD-10-CM | POA: Diagnosis not present

## 2022-12-24 LAB — ECHOCARDIOGRAM COMPLETE
Area-P 1/2: 3.65 cm2
S' Lateral: 2.24 cm

## 2023-01-26 ENCOUNTER — Encounter: Payer: Self-pay | Admitting: Internal Medicine

## 2023-01-26 ENCOUNTER — Ambulatory Visit: Payer: Medicaid Other | Attending: Internal Medicine | Admitting: Internal Medicine

## 2023-01-26 VITALS — BP 112/72 | HR 58 | Ht 66.0 in | Wt 201.2 lb

## 2023-01-26 DIAGNOSIS — Z136 Encounter for screening for cardiovascular disorders: Secondary | ICD-10-CM | POA: Diagnosis not present

## 2023-01-26 DIAGNOSIS — Z8679 Personal history of other diseases of the circulatory system: Secondary | ICD-10-CM

## 2023-01-26 DIAGNOSIS — M329 Systemic lupus erythematosus, unspecified: Secondary | ICD-10-CM | POA: Diagnosis not present

## 2023-01-26 NOTE — Patient Instructions (Signed)
Medication Instructions:  NO CHANGES  *If you need a refill on your cardiac medications before your next appointment, please call your pharmacy*    Follow-Up: At [facility], you and your health needs are our priority.  As part of our continuing mission to provide you with exceptional heart care, we have created designated Provider Care Teams.  These Care Teams include your primary Cardiologist (physician) and Advanced Practice Providers (APPs -  Physician Assistants and Nurse Practitioners) who all work together to provide you with the care you need, when you need it.  We recommend signing up for the patient portal called "MyChart".  Sign up information is provided on this After Visit Summary.  MyChart is used to connect with patients for Virtual Visits (Telemedicine).  Patients are able to view lab/test results, encounter notes, upcoming appointments, etc.  Non-urgent messages can be sent to your provider as well.   To learn more about what you can do with MyChart, go to [email or web address].    Your next appointment:    AS NEEDED with Dr. Hilty  

## 2023-01-26 NOTE — Progress Notes (Addendum)
OFFICE CONSULT NOTE  Chief Complaint:  No complaints  Primary Care Physician: Georganna Skeans, MD  HPI:  Tiffany Herrera is a 32 y.o. female who is being seen today for the evaluation of chest pain/dyspnea at the request of Georganna Skeans, MD. This is a 32 yo female known to me from recent hospitalization in August 2023 at which time she had presented with pericardial effusion and she was noted to have a very high sedimentation rate.  Subsequently I had diagnosed her with lupus based on an abnormal ANA.  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  She has seen Dr. Jonathon Bellows.  Her last appointment 1 month of January 8 at which time she was doing well.  Complement levels were noted to be normal.  She was asymptomatic.  She had followed up with [place] several times including an echocardiogram in October 2023 which showed normal LVEF, mild MR and moderate PR but no pericardial effusion.  She had completed a course of colchicine, she had weaned off of NSAIDs and steroids.  She has been maintained on hydroxychloroquine since September.  She was just seen in the emergency department on 03/30/2022 for shortness of breath that started the day before her presentation.  She says it is tough to get air in.  She is reporting left-sided chest pain that was worse with deep breathing.  It was rated as an 8 out of 10 in intensity.  She said it felt like when she had a pleural effusion in the past.  Chest x-ray, however showed no acute disease.  Troponins were negative.  She was given Toradol and encouraged to follow-up with cardiology.  07/24/2022  [name] returns today for follow-up.  She reports that her chest pain has improved significantly.  She still gets some mild pleuritic chest pain with deep breaths but overall she has no limitations.  She has been on hydroxychloroquine for her lupus.  She is in the process of trying to establish with a rheumatologist  within the [facility] system.  She was seen by pulmonary also felt she had atypical chest pain likely pleuritic/chest wall.  Repeat echo to follow-up on pericardial effusion is already scheduled in October 2024.  01/26/2023  [name] is seen today in follow-up.  She is doing very well.  She has had no further chest pain or worsening shortness of breath.  She has had a repeat echo back in October.  This showed normal systolic function, no pericardial effusion.  She has done well on hydroxychloroquine.  She is establishing with Dr. Dimple Casey in December for rheumatology.   PMHx:  Past Medical History:  Diagnosis Date   Acute idiopathic pericarditis    Heart valve insufficiency    Lupus (systemic lupus erythematosus) (HCC)    Pericardial effusion - resolved    Pleural effusion     No past surgical history on file.  FAMHx:  Family History  Problem Relation Age of Onset   Healthy Mother    Healthy Father    Diabetes Maternal Grandmother    Hypertension Maternal Aunt    Diabetes Maternal Aunt    Hypertension Cousin     SOCHx:   reports that she has never smoked. She uses smokeless tobacco. She reports current alcohol use. She reports that she does not use drugs.  ALLERGIES:  No Known Allergies  ROS: Pertinent items noted in HPI and remainder of comprehensive ROS otherwise negative.  HOME MEDS:  Current Outpatient Medications on File Prior to Visit  Medication Sig Dispense Refill   hydroxychloroquine (PLAQUENIL) 200 MG tablet Take 200 mg by mouth 2 (two) times daily.     spironolactone (ALDACTONE) 100 MG tablet TAKE 1 TABLET(100 MG) BY MOUTH DAILY (Patient not taking: Reported on 01/26/2023) 30 tablet 2   [DISCONTINUED] albuterol (PROVENTIL HFA;VENTOLIN HFA) 108 (90 Base) MCG/ACT inhaler Inhale 2 puffs into the lungs every 4 (four) hours as needed for shortness of breath (cough). 1 Inhaler 0   No current facility-administered medications on file prior to visit.    LABS/IMAGING: No  results found for this or any previous visit (from the past 48 hour(s)). No results found.  LIPID PANEL:    Component Value Date/Time   CHOL 128 11/11/2022 1429   TRIG 59 11/11/2022 1429   HDL 43 11/11/2022 1429   CHOLHDL 3.0 11/11/2022 1429   LDLCALC 72 11/11/2022 1429    WEIGHTS: Wt Readings from Last 3 Encounters:  01/26/23 201 lb 3.2 oz (91.3 kg)  12/01/22 198 lb 6.4 oz (90 kg)  11/11/22 191 lb 6.4 oz (86.8 kg)    VITALS: BP 112/72 (BP Location: Left Arm, Patient Position: Sitting, Cuff Size: Normal)   Pulse (!) 58   Ht 5\' 6"  (1.676 m)   Wt 201 lb 3.2 oz (91.3 kg)   SpO2 98%   BMI 32.47 kg/m   EXAM: General appearance: alert and no distress Neck: no carotid bruit, no JVD, and thyroid not enlarged, symmetric, no tenderness/mass/nodules Lungs: clear to auscultation bilaterally Heart: regular rate and rhythm, S1, S2 normal, no murmur, click, rub or gallop Abdomen: soft, non-tender; bowel sounds normal; no masses,  no organomegaly Extremities: extremities normal, atraumatic, no cyanosis or edema Pulses: 2+ and symmetric Skin: Skin color, texture, turgor normal. No rashes or lesions Neurologic: Grossly normal Psych: Pleasant  EKG: EKG Interpretation Date/Time:  Monday January 26 2023 09:02:27 EST Ventricular Rate:  52 PR Interval:  190 QRS Duration:  70 QT Interval:  412 QTC Calculation: 383 R Axis:   93  Text Interpretation: Sinus bradycardia Rightward axis When compared with ECG of 30-Jul-2022 09:00, The rate is slower- possible right atrial enlargement was previously noted Confirmed by Zoila Shutter [phone]) on 01/26/2023 9:09:49 AM    ASSESSMENT: Recently diagnosed SLE History of pericardial effusion secondary to SLE Recurrent shortness of breath and chest pain  PLAN: 1.   [name] is doing much better without any chest pain or shortness of breath.  Her recent echo showed no pericardial effusion.  Overall she is doing well on hydroxychloroquine.  She  will establish with Dr. Dimple Casey with rheumatology in December.  She can follow-up with [place] as needed.  Chrystie Nose, MD, [facility], FACP  [place]  [facility]  Medical Director of the Advanced Lipid Disorders &  Cardiovascular Risk Reduction Clinic Diplomate of the American Board of Clinical Lipidology Attending Cardiologist  Direct Dial: [phone]  Fax: [phone]  Website:  [email or web address] Govan 01/26/2023, 9:09 AM

## 2023-01-27 ENCOUNTER — Other Ambulatory Visit: Payer: Self-pay

## 2023-01-27 ENCOUNTER — Encounter (HOSPITAL_BASED_OUTPATIENT_CLINIC_OR_DEPARTMENT_OTHER): Payer: Self-pay | Admitting: Emergency Medicine

## 2023-01-27 ENCOUNTER — Emergency Department (HOSPITAL_BASED_OUTPATIENT_CLINIC_OR_DEPARTMENT_OTHER)
Admission: EM | Admit: 2023-01-27 | Discharge: 2023-01-28 | Disposition: A | Discharge status: Home / Self Care | Payer: Medicaid Other | Attending: Emergency Medicine | Admitting: Emergency Medicine

## 2023-01-27 DIAGNOSIS — D72819 Decreased white blood cell count, unspecified: Secondary | ICD-10-CM | POA: Diagnosis not present

## 2023-01-27 DIAGNOSIS — E871 Hypo-osmolality and hyponatremia: Secondary | ICD-10-CM | POA: Diagnosis not present

## 2023-01-27 DIAGNOSIS — G43909 Migraine, unspecified, not intractable, without status migrainosus: Secondary | ICD-10-CM | POA: Insufficient documentation

## 2023-01-27 DIAGNOSIS — R739 Hyperglycemia, unspecified: Secondary | ICD-10-CM | POA: Diagnosis not present

## 2023-01-27 DIAGNOSIS — R519 Headache, unspecified: Secondary | ICD-10-CM | POA: Diagnosis present

## 2023-01-27 LAB — BASIC METABOLIC PANEL
Anion gap: 6 (ref 5–15)
BUN: 14 mg/dL (ref 6–20)
CO2: 25 mmol/L (ref 22–32)
Calcium: 8.8 mg/dL — ABNORMAL LOW (ref 8.9–10.3)
Chloride: 103 mmol/L (ref 98–111)
Creatinine, Ser: 0.81 mg/dL (ref 0.44–1.00)
GFR, Estimated: >60 mL/min (ref >60)
Glucose, Bld: 125 mg/dL — ABNORMAL HIGH (ref 70–99)
Potassium: 4.5 mmol/L (ref 3.5–5.1)
Sodium: 134 mmol/L — ABNORMAL LOW (ref 135–145)

## 2023-01-27 LAB — CBC WITH DIFFERENTIAL/PLATELET
Abs Immature Granulocytes: 0.01 10*3/uL (ref 0.00–0.07)
Basophils Absolute: 0 10*3/uL (ref 0.0–0.1)
Basophils Relative: 0 %
Eosinophils Absolute: 0 10*3/uL (ref 0.0–0.5)
Eosinophils Relative: 0 %
HCT: 36.6 % (ref 36.0–46.0)
Hemoglobin: 12.1 g/dL (ref 12.0–15.0)
Immature Granulocytes: 0 %
Lymphocytes Relative: 26 %
Lymphs Abs: 0.9 10*3/uL (ref 0.7–4.0)
MCH: 27.3 pg (ref 26.0–34.0)
MCHC: 33.1 g/dL (ref 30.0–36.0)
MCV: 82.6 fL (ref 80.0–100.0)
Monocytes Absolute: 0.5 10*3/uL (ref 0.1–1.0)
Monocytes Relative: 15 %
Neutro Abs: 2.1 10*3/uL (ref 1.7–7.7)
Neutrophils Relative %: 59 %
Platelets: 238 10*3/uL (ref 150–400)
RBC: 4.43 MIL/uL (ref 3.87–5.11)
RDW: 13.1 % (ref 11.5–15.5)
WBC: 3.6 10*3/uL — ABNORMAL LOW (ref 4.0–10.5)
nRBC: 0 % (ref 0.0–0.2)

## 2023-01-27 MED ORDER — ACETAMINOPHEN 325 MG PO TABS
650.0000 mg | ORAL_TABLET | Freq: Once | ORAL | Status: AC
  Administered 2023-01-27: 650 mg via ORAL
  Filled 2023-01-27: qty 2

## 2023-01-27 MED ORDER — DIPHENHYDRAMINE HCL 50 MG/ML IJ SOLN
12.5000 mg | Freq: Once | INTRAMUSCULAR | Status: AC
  Administered 2023-01-27: 12.5 mg via INTRAVENOUS
  Filled 2023-01-27: qty 1

## 2023-01-27 MED ORDER — METOCLOPRAMIDE HCL 5 MG/ML IJ SOLN
10.0000 mg | Freq: Once | INTRAMUSCULAR | Status: AC
  Administered 2023-01-27: 10 mg via INTRAVENOUS
  Filled 2023-01-27: qty 2

## 2023-01-27 MED ORDER — SODIUM CHLORIDE 0.9 % IV BOLUS
1000.0000 mL | Freq: Once | INTRAVENOUS | Status: AC
  Administered 2023-01-27: 1000 mL via INTRAVENOUS

## 2023-01-27 NOTE — ED Triage Notes (Signed)
Pt c/o intermittent migraine with left eye pressure since last Thursday. States that she does not have a hx of migraines and pain is relieved for a short time with excedrin.

## 2023-01-27 NOTE — ED Provider Notes (Signed)
[place] [facility] Provider Note   CSN: [id number] Arrival date & time: 01/27/23  2127     History  Chief Complaint  Patient presents with   Migraine    Tambrey Donate is a 32 y.o. female.   Migraine     32 year old female with medical history significant for pericarditis, lupus, presenting to the Emergency Department with a headache.  She states that she has had left-sided throbbing headache for the last 5 days or so.  She denies any falls or trauma, denies any fevers or neck stiffness, states that she has associated light sensitivity sound sensitivity.  No nausea or vomiting.  She has been taking Excedrin at home without relief.  Denies any vision loss, no double vision.  She denies any focal neurologic deficits.  Home Medications Prior to Admission medications   Medication Sig Start Date End Date Taking? Authorizing Provider  hydroxychloroquine (PLAQUENIL) 200 MG tablet Take 200 mg by mouth 2 (two) times daily.    [provider]  albuterol (PROVENTIL HFA;VENTOLIN HFA) 108 (90 Base) MCG/ACT inhaler Inhale 2 puffs into the lungs every 4 (four) hours as needed for shortness of breath (cough). 05/14/18 09/21/18  Azalia Bilis, MD      Allergies    Patient has no known allergies.    Review of Systems   Review of Systems  All other systems reviewed and are negative.   Physical Exam Updated Vital Signs BP 120/69   Pulse 60   Temp 98.3 F (36.8 C)   Resp 18   Ht 5\' 6"  (1.676 m)   Wt 83.5 kg   LMP 01/06/2023   SpO2 100%   BMI 29.70 kg/m  Physical Exam Vitals and nursing note reviewed.  Constitutional:      General: She is not in acute distress.    Appearance: She is well-developed.  HENT:     Head: Normocephalic and atraumatic.  Eyes:     Conjunctiva/sclera: Conjunctivae normal.  Cardiovascular:     Rate and Rhythm: Normal rate and regular rhythm.  Pulmonary:     Effort: Pulmonary effort is normal. No  respiratory distress.     Breath sounds: Normal breath sounds.  Abdominal:     Palpations: Abdomen is soft.     Tenderness: There is no abdominal tenderness.  Musculoskeletal:        General: No swelling.     Cervical back: Neck supple.  Skin:    General: Skin is warm and dry.     Capillary Refill: Capillary refill takes less than 2 seconds.  Neurological:     Mental Status: She is alert.     Comments: MENTAL STATUS EXAM:    Orientation: Alert and oriented to person, place and time.  Memory: Cooperative, follows commands well.  Language: Speech is clear and language is normal.   CRANIAL NERVES:    CN 2 (Optic): Visual fields intact to confrontation.  CN 3,4,6 (EOM): Pupils equal and reactive to light. Full extraocular eye movement without nystagmus.  CN 5 (Trigeminal): Facial sensation is normal, no weakness of masticatory muscles.  CN 7 (Facial): No facial weakness or asymmetry.  CN 8 (Auditory): Auditory acuity grossly normal.  CN 9,10 (Glossophar): The uvula is midline, the palate elevates symmetrically.  CN 11 (spinal access): Normal sternocleidomastoid and trapezius strength.  CN 12 (Hypoglossal): The tongue is midline. No atrophy or fasciculations.Marland Kitchen   MOTOR:  Muscle Strength: 5/5RUE, 5/5LUE, 5/5RLE, 5/5LLE.   COORDINATION:   No  tremor.   SENSATION:   Intact to light touch all four extremities.     Psychiatric:        Mood and Affect: Mood normal.     ED Results / Procedures / Treatments   Labs (all labs ordered are listed, but only abnormal results are displayed) Labs Reviewed - No data to display  EKG None  Radiology No results found.  Procedures Procedures    Medications Ordered in ED Medications - No data to display  ED Course/ Medical Decision Making/ A&P                                 Medical Decision Making Amount and/or Complexity of Data Reviewed Labs: ordered.  Risk OTC drugs. Prescription drug management.    32 year old female  with medical history significant for pericarditis, lupus, presenting to the Emergency Department with a headache.  She states that she has had left-sided throbbing headache for the last 5 days or so.  She denies any falls or trauma, denies any fevers or neck stiffness, states that she has associated light sensitivity sound sensitivity.  No nausea or vomiting.  She has been taking Excedrin at home without relief.  Denies any vision loss, no double vision.  She denies any focal neurologic deficits.  Jeidy Anson is a 33 y.o. female who presents with *** as per above. I have reviewed the nursing documentation for past medical history, family history, and social history. I have reviewed the EMR and have learned that ***.  *** is awake, alert, GCS 15, HDS, and ***afebrile. *** exam is most notable for ***fully intact extraocular motions with bilaterally reactive pupils, ***no focal neurologic deficits, ***no meningismus, and ***no temporal tenderness. There is no rash. The headache was ***not sudden onset ***or the worst headache of the patient's life. There is ***no visual deficit.  I am most concerned for ***.  To further evaluate and risk stratify ***, labs and imaging were obtained, which were significant for:  Labs: *** Imaging: ***   I do not think the patient has an aneurysm, intracranial bleed, mass lesion, meningitis, temporal arteritis, stroke, cluster headache, idiopathic intracranial hypertension, cavernous sinus thrombosis, carbon monoxide toxicity, herpes zoster, carotid or vertebral artery dissection, or acute angle close glaucoma.  On reassessment, the patient remained well appearing and was again able to ambulate without difficulty and did not have any focal neurologic deficits.  I believe the patient ***.  ***We participated in shared decision making regarding continued observation in the ED versus discharge home for continued recovery after receiving *** medications. ***  preferred to recover at home. I believe that this is safe and reasonable.  We have discussed the diagnosis and risks, and we agree with discharging home to follow-up with their primary doctor. We also discussed returning to the Emergency Department immediately if new or worsening symptoms occur. We have discussed the symptoms which are most concerning (e.g., changing or worsening pain, weakness, vomiting, fever, or abnormal sensation) that necessitate immediate return. I provided ED return precautions. The patient felt safe with this plan.  ED Medication Summary: Medications  acetaminophen (TYLENOL) tablet 650 mg (has no administration in time range)  sodium chloride 0.9 % bolus 1,000 mL (1,000 mLs Intravenous New Bag/Given 01/27/23 2335)  metoCLOPramide (REGLAN) injection 10 mg (10 mg Intravenous Given 01/27/23 2337)  diphenhydrAMINE (BENADRYL) injection 12.5 mg (12.5 mg Intravenous Given 01/27/23 2336)     {Document  critical care time when appropriate:1} {Document review of labs and clinical decision tools ie heart score, Chads2Vasc2 etc:1}  {Document your independent review of radiology images, and any outside records:1} {Document your discussion with family members, caretakers, and with consultants:1} {Document social determinants of health affecting pt's care:1} {Document your decision making why or why not admission, treatments were needed:1} Final Clinical Impression(s) / ED Diagnoses Final diagnoses:  None    Rx / DC Orders ED Discharge Orders     None

## 2023-01-28 LAB — HCG, SERUM, QUALITATIVE: Preg, Serum: NEGATIVE

## 2023-02-25 NOTE — Progress Notes (Signed)
Office Visit Note  Patient: Tiffany Herrera             Date of Birth: 01/01/1991           MRN: [id number]             PCP: Georganna Skeans, MD Referring: Rema Fendt, NP Visit Date: 02/26/2023  Subjective:  New Patient (Initial Visit) (Patient states she has a rash that comes and goes the will show up on the back of her thighs and her butt. )   Discussed the use of AI scribe software for clinical note transcription with the patient, who gave verbal consent to proceed.  History of Present Illness   Tiffany Herrera is a 32 y.o. female here for evaluation with history of lupus diagnosed after an episode of chest inflammation and fluid accumulation in August 2023. Subsequent evaluation was positive for ANA 1:1280 speckled pattern with negative dsDNA and normal complements. The patient has been on hydroxychloroquine since the diagnosis was made at a different rheumatology office. The patient reports no significant chest issues since the initial inflammation episode.  Prior to the SLE diagnosis, the patient experienced joint pain, particularly in one foot or one hand at a time, described as severe arthritis-like pain. The foot would occasionally swell, but no swelling was noted in the hands. These symptoms began a few months before the SLE diagnosis. Prednisone was prescribed for these symptoms, which would recur a few months after each course of treatment.  The patient also reports periodic skin issues, characterized by itchy, fine bumps on the back of the legs and buttocks. This has been occurring for approximately two years. Recently, the patient noticed a lightening of the skin on the face, which was not present before. No issues with hair loss, mouth or nose ulcers, or lymph node swelling were reported. However, the patient did have an abscess in the armpit a few months ago, which was treated with drainage and antibiotics.  The patient has been adhering to the prescribed  hydroxychloroquine regimen without any reported issues. The patient also has regular eye exams due to the long-term use of hydroxychloroquine. The patient has expressed a desire to have another child in the next two years and has been reassured that the current medication is safe for pregnancy. 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   Lupus manifestations Pericardial effusion Arthralgia vs arthritis Leukopenia  Labs reviewed 03/2022 HBV/HCV neg  11/2021 RNP >8.0 Sm >8.0 SSA,SSB, Scl-70, Jo-1, centromere neg RF neg CCP neg  Imaging reviewed TTE: 11/01/21 1. Left ventricular ejection fraction, by estimation, is 65 to 70%. Left ventricular ejection fraction by PLAX is 68 %. The left ventricle has normal function. The left ventricle has no regional wall motion abnormalities. Left ventricular diastolic  parameters are consistent with Grade I diastolic dysfunction (impaired relaxation).  2. Right ventricular systolic function is normal. The right ventricular size is normal.  3. A small pericardial effusion is present. The pericardial effusion is circumferential. There is no evidence of cardiac tamponade.  4. The mitral valve is normal in structure. No evidence of mitral valve regurgitation. No evidence of mitral stenosis.  5. The aortic valve is tricuspid. Aortic valve regurgitation is not visualized. No aortic stenosis is present.  6. The inferior vena cava is normal in size with greater than 50% respiratory variability, suggesting right atrial pressure of 3 mmHg.   Activities of Daily Living:  Patient  reports morning stiffness for 0 minute.   Patient Denies nocturnal pain.  Difficulty dressing/grooming: Denies Difficulty climbing stairs: Denies Difficulty getting out of chair: Denies Difficulty using hands for taps, buttons, cutlery, and/or writing: Denies  Review of Systems  Constitutional:  Positive for  fatigue.  HENT:  Negative for mouth sores and mouth dryness.   Eyes:  Negative for dryness.  Respiratory:  Negative for shortness of breath.   Cardiovascular:  Negative for chest pain and palpitations.  Gastrointestinal:  Negative for blood in stool, constipation and diarrhea.  Endocrine: Negative for increased urination.  Genitourinary:  Negative for involuntary urination.  Musculoskeletal:  Negative for joint pain, gait problem, joint pain, joint swelling, myalgias, muscle weakness, morning stiffness, muscle tenderness and myalgias.  Skin:  Positive for rash. Negative for color change, hair loss and sensitivity to sunlight.  Allergic/Immunologic: Negative for susceptible to infections.  Neurological:  Negative for dizziness and headaches.  Hematological:  Negative for swollen glands.  Psychiatric/Behavioral:  Negative for depressed mood and sleep disturbance. The patient is not nervous/anxious.     PMFS History:  Patient Active Problem List   Diagnosis Date Noted   Facial rash 02/26/2023   Pleuritic chest pain 04/09/2022   Pleural effusion 12/13/2021   Lupus (systemic lupus erythematosus) (HCC) 12/13/2021   Heart valve insufficiency 12/13/2021   Pericarditis 10/31/2021   Hyponatremia 10/31/2021   Annual physical exam 05/28/2021   Acute cough 05/23/2021    Past Medical History:  Diagnosis Date   Acute idiopathic pericarditis    Heart valve insufficiency    Lupus (systemic lupus erythematosus) (HCC)    Pericardial effusion - resolved    Pleural effusion     Family History  Problem Relation Age of Onset   Healthy Mother    Healthy Father    Diabetes Maternal Grandmother    Hypertension Maternal Aunt    Diabetes Maternal Aunt    Hypertension Cousin    History reviewed. No pertinent surgical history. Social History   Social History Narrative   Not on file    There is no immunization history on file for this patient.   Objective: Vital Signs: BP 98/63 (BP Location:  Right Arm, Patient Position: Sitting, Cuff Size: Normal)   Pulse 73   Resp 14   Ht 5\' 6"  (1.676 m)   Wt 200 lb (90.7 kg)   LMP 02/13/2023   BMI 32.28 kg/m    Physical Exam Eyes:     Conjunctiva/sclera: Conjunctivae normal.  Cardiovascular:     Rate and Rhythm: Normal rate and regular rhythm.  Pulmonary:     Effort: Pulmonary effort is normal.     Breath sounds: Normal breath sounds.  Lymphadenopathy:     Cervical: No cervical adenopathy.  Skin:    General: Skin is warm and dry.     Comments: Central facial skin lightening across cheeks and bridge of nose Normal appearing nailfold capillaroscopy  Neurological:     Mental Status: She is alert.  Psychiatric:        Mood and Affect: Mood normal.      Musculoskeletal Exam:  Shoulders full ROM no tenderness or swelling Elbows full ROM no tenderness or swelling Wrists full ROM no tenderness or swelling Fingers full ROM no tenderness or swelling Knees full ROM no tenderness or swelling Ankles full ROM no tenderness or swelling   Investigation: No additional findings.  Imaging: No results found.  Recent Labs: Lab Results  Component Value Date   WBC 3.7 (L)  02/26/2023   HGB 12.9 02/26/2023   PLT 257 02/26/2023   NA 134 (L) 01/27/2023   K 4.5 01/27/2023   CL 103 01/27/2023   CO2 25 01/27/2023   GLUCOSE 125 (H) 01/27/2023   BUN 14 01/27/2023   CREATININE 0.81 01/27/2023   BILITOT 0.7 11/11/2022   ALKPHOS 64 11/11/2022   AST 17 11/11/2022   ALT 16 11/11/2022   PROT 8.7 (H) 11/11/2022   ALBUMIN 4.3 11/11/2022   CALCIUM 8.8 (L) 01/27/2023   GFRAA >60 05/15/2019    Speciality Comments: No specialty comments available.  Procedures:  No procedures performed Allergies: Patient has no known allergies.   Assessment / Plan:     Visit Diagnoses: Systemic lupus erythematosus (SLE) with pericarditis, unspecified SLE type (HCC) - Plan: Sedimentation rate, Anti-DNA antibody, double-stranded, C3 and C4, Protein /  creatinine ratio, urine, CBC with Differential/Platelet, hydrOXYzine (ATARAX) 25 MG tablet  Pleuritic chest pain  Facial rash  Assessment and Plan    Systemic Lupus Erythematosus (SLE) Diagnosed with SLE after hospitalization for chest inflammation. Currently on hydroxychloroquine. Reports intermittent joint pain in hands and feet, and occasional skin rash on legs and buttocks. Noted facial discoloration, likely related to lupus. No current renal involvement based on previous urine sample. -Continue hydroxychloroquine 200 mg BID. -Order blood work to monitor disease activity. -Consider dermatology referral if facial discoloration persists or worsens. -Consider hydroxyzine for symptomatic relief of skin itching. -Schedule follow-up in 3 months.  UV Light Sensitivity Likely due to SLE and hydroxychloroquine use. No current skin rashes or discolorations reported. -Advise on sun protection measures and avoidance of UV light exposure. -Recommend Vitamin D supplementation due to potential for deficiency with sun avoidance.  Pregnancy Planning Patient is considering pregnancy. Currently on hydroxychloroquine, which is safe for pregnancy. No current risk factors identified that would contraindicate pregnancy. SSA negative. -Advise patient to inform OBGYN of lupus diagnosis. -Continue hydroxychloroquine during pregnancy.    Orders: Orders Placed This Encounter  Procedures   Sedimentation rate   Anti-DNA antibody, double-stranded   C3 and C4   Protein / creatinine ratio, urine   CBC with Differential/Platelet   Meds ordered this encounter  Medications   hydrOXYzine (ATARAX) 25 MG tablet    Sig: Take 1 tablet (25 mg total) by mouth every 8 (eight) hours as needed.    Dispense:  30 tablet    Refill:  0     Follow-Up Instructions: Return in about 3 months (around 05/27/2023) for New pt SLE on HCQ f/u 3mos.   Fuller Plan, MD  Note - This record has been created using [employer].  Chart creation errors have been sought, but may not always  have been located. Such creation errors do not reflect on  the standard of medical care.

## 2023-02-26 ENCOUNTER — Encounter: Payer: Self-pay | Admitting: Internal Medicine

## 2023-02-26 ENCOUNTER — Ambulatory Visit: Payer: Medicaid Other | Attending: Internal Medicine | Admitting: Internal Medicine

## 2023-02-26 VITALS — BP 98/63 | HR 73 | Resp 14 | Ht 66.0 in | Wt 200.0 lb

## 2023-02-26 DIAGNOSIS — R21 Rash and other nonspecific skin eruption: Secondary | ICD-10-CM | POA: Diagnosis not present

## 2023-02-26 DIAGNOSIS — M3212 Pericarditis in systemic lupus erythematosus: Secondary | ICD-10-CM

## 2023-02-26 DIAGNOSIS — R0781 Pleurodynia: Secondary | ICD-10-CM | POA: Diagnosis not present

## 2023-02-26 MED ORDER — HYDROXYZINE HCL 25 MG PO TABS: 25.0000 mg | ORAL_TABLET | Freq: Three times a day (TID) | ORAL | 0 refills | Status: DC | PRN

## 2023-02-27 LAB — PROTEIN / CREATININE RATIO, URINE
Creatinine, Urine: 125 mg/dL (ref 20–275)
Protein/Creat Ratio: 72 mg/g{creat} (ref 24–184)
Protein/Creatinine Ratio: 0.072 mg/mg{creat} (ref 0.024–0.184)
Total Protein, Urine: 9 mg/dL (ref 5–24)

## 2023-02-27 LAB — CBC WITH DIFFERENTIAL/PLATELET
Absolute Lymphocytes: 1206 {cells}/uL (ref 850–3900)
Absolute Monocytes: 633 {cells}/uL (ref 200–950)
Basophils Absolute: 11 {cells}/uL (ref 0–200)
Basophils Relative: 0.3 %
Eosinophils Absolute: 11 {cells}/uL — ABNORMAL LOW (ref 15–500)
Eosinophils Relative: 0.3 %
HCT: 39 % (ref 35.0–45.0)
Hemoglobin: 12.9 g/dL (ref 11.7–15.5)
MCH: 28.1 pg (ref 27.0–33.0)
MCHC: 33.1 g/dL (ref 32.0–36.0)
MCV: 85 fL (ref 80.0–100.0)
MPV: 9.9 fL (ref 7.5–12.5)
Monocytes Relative: 17.1 %
Neutro Abs: 1839 {cells}/uL (ref 1500–7800)
Neutrophils Relative %: 49.7 %
Platelets: 257 10*3/uL (ref 140–400)
RBC: 4.59 10*6/uL (ref 3.80–5.10)
RDW: 12.8 % (ref 11.0–15.0)
Total Lymphocyte: 32.6 %
WBC: 3.7 10*3/uL — ABNORMAL LOW (ref 3.8–10.8)

## 2023-02-27 LAB — C3 AND C4
C3 Complement: 95 mg/dL (ref 83–193)
C4 Complement: 14 mg/dL — ABNORMAL LOW (ref 15–57)

## 2023-02-27 LAB — SEDIMENTATION RATE: Sed Rate: 11 mm/h (ref 0–20)

## 2023-02-27 LAB — ANTI-DNA ANTIBODY, DOUBLE-STRANDED: ds DNA Ab: 4 [IU]/mL

## 2023-05-26 NOTE — Progress Notes (Unsigned)
 Office Visit Note  Patient: Tiffany Herrera             Date of Birth: 1991/02/04           MRN: [id number]             PCP: Georganna Skeans, MD Referring: Georganna Skeans, MD Visit Date: 05/27/2023   Subjective:  No chief complaint on file.   History of Present Illness: Tiffany Herrera is a 33 y.o. female here for follow up ***   Previous HPI 02/26/23 Tiffany Herrera is a 33 y.o. female here for evaluation with history of lupus diagnosed after an episode of chest inflammation and fluid accumulation in August 2023. Subsequent evaluation was positive for ANA 1:1280 speckled pattern with negative dsDNA and normal complements. The patient has been on hydroxychloroquine since the diagnosis was made at a different rheumatology office. The patient reports no significant chest issues since the initial inflammation episode.   Prior to the SLE diagnosis, the patient experienced joint pain, particularly in one foot or one hand at a time, described as severe arthritis-like pain. The foot would occasionally swell, but no swelling was noted in the hands. These symptoms began a few months before the SLE diagnosis. Prednisone was prescribed for these symptoms, which would recur a few months after each course of treatment.   The patient also reports periodic skin issues, characterized by itchy, fine bumps on the back of the legs and buttocks. This has been occurring for approximately two years. Recently, the patient noticed a lightening of the skin on the face, which was not present before. No issues with hair loss, mouth or nose ulcers, or lymph node swelling were reported. However, the patient did have an abscess in the armpit a few months ago, which was treated with drainage and antibiotics.   The patient has been adhering to the prescribed hydroxychloroquine regimen without any reported issues. The patient also has regular eye exams due to the long-term use of hydroxychloroquine. The patient has  expressed a desire to have another child in the next two years and has been reassured that the current medication is safe for pregnancy. 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    Lupus manifestations Pericardial effusion Arthralgia vs arthritis Leukopenia   Labs reviewed 03/2022 HBV/HCV neg   11/2021 RNP >8.0 Sm >8.0 SSA,SSB, Scl-70, Jo-1, centromere neg RF neg CCP neg   Imaging reviewed TTE: 11/01/21 1. Left ventricular ejection fraction, by estimation, is 65 to 70%. Left ventricular ejection fraction by PLAX is 68 %. The left ventricle has normal function. The left ventricle has no regional wall motion abnormalities. Left ventricular diastolic  parameters are consistent with Grade I diastolic dysfunction (impaired relaxation).  2. Right ventricular systolic function is normal. The right ventricular size is normal.  3. A small pericardial effusion is present. The pericardial effusion is circumferential. There is no evidence of cardiac tamponade.  4. The mitral valve is normal in structure. No evidence of mitral valve regurgitation. No evidence of mitral stenosis.  5. The aortic valve is tricuspid. Aortic valve regurgitation is not visualized. No aortic stenosis is present.  6. The inferior vena cava is normal in size with greater than 50% respiratory variability, suggesting right atrial pressure of 3 mmHg.    No Rheumatology ROS completed.   PMFS History:  Patient Active Problem List   Diagnosis Date Noted   Facial rash 02/26/2023   Pleuritic chest  pain 04/09/2022   Pleural effusion 12/13/2021   Lupus (systemic lupus erythematosus) (HCC) 12/13/2021   Heart valve insufficiency 12/13/2021   Pericarditis 10/31/2021   Hyponatremia 10/31/2021   Annual physical exam 05/28/2021   Acute cough 05/23/2021    Past Medical History:  Diagnosis Date   Acute idiopathic pericarditis    Heart valve insufficiency     Lupus (systemic lupus erythematosus) (HCC)    Pericardial effusion - resolved    Pleural effusion     Family History  Problem Relation Age of Onset   Healthy Mother    Healthy Father    Diabetes Maternal Grandmother    Hypertension Maternal Aunt    Diabetes Maternal Aunt    Hypertension Cousin    No past surgical history on file. Social History   Social History Narrative   Not on file    There is no immunization history on file for this patient.   Objective: Vital Signs: There were no vitals taken for this visit.   Physical Exam   Musculoskeletal Exam: ***  CDAI Exam: CDAI Score: -- Patient Global: --; Provider Global: -- Swollen: --; Tender: -- Joint Exam 05/27/2023   No joint exam has been documented for this visit   There is currently no information documented on the homunculus. Go to the Rheumatology activity and complete the homunculus joint exam.  Investigation: No additional findings.  Imaging: No results found.  Recent Labs: Lab Results  Component Value Date   WBC 3.7 (L) 02/26/2023   HGB 12.9 02/26/2023   PLT 257 02/26/2023   NA 134 (L) 01/27/2023   K 4.5 01/27/2023   CL 103 01/27/2023   CO2 25 01/27/2023   GLUCOSE 125 (H) 01/27/2023   BUN 14 01/27/2023   CREATININE 0.81 01/27/2023   BILITOT 0.7 11/11/2022   ALKPHOS 64 11/11/2022   AST 17 11/11/2022   ALT 16 11/11/2022   PROT 8.7 (H) 11/11/2022   ALBUMIN 4.3 11/11/2022   CALCIUM 8.8 (L) 01/27/2023   GFRAA >60 05/15/2019    Speciality Comments: No specialty comments available.  Procedures:  No procedures performed Allergies: Patient has no known allergies.   Assessment / Plan:     Visit Diagnoses: No diagnosis found.  ***  Orders: No orders of the defined types were placed in this encounter.  No orders of the defined types were placed in this encounter.    Follow-Up Instructions: No follow-ups on file.   Fuller Plan, MD  Note - This record has been created  using [employer].  Chart creation errors have been sought, but may not always  have been located. Such creation errors do not reflect on  the standard of medical care.

## 2023-05-27 ENCOUNTER — Encounter: Payer: Self-pay | Admitting: Internal Medicine

## 2023-05-27 ENCOUNTER — Ambulatory Visit: Payer: Medicaid Other | Attending: Internal Medicine | Admitting: Internal Medicine

## 2023-05-27 VITALS — BP 92/56 | HR 66 | Resp 14 | Ht 66.0 in | Wt 201.0 lb

## 2023-05-27 DIAGNOSIS — E559 Vitamin D deficiency, unspecified: Secondary | ICD-10-CM | POA: Diagnosis not present

## 2023-05-27 DIAGNOSIS — M3212 Pericarditis in systemic lupus erythematosus: Secondary | ICD-10-CM | POA: Diagnosis not present

## 2023-05-27 DIAGNOSIS — Z79899 Other long term (current) drug therapy: Secondary | ICD-10-CM

## 2023-05-27 MED ORDER — HYDROXYCHLOROQUINE SULFATE 200 MG PO TABS
200.0000 mg | ORAL_TABLET | Freq: Two times a day (BID) | ORAL | 1 refills | Status: DC
Start: 2023-05-27 — End: 2023-09-23

## 2023-05-27 NOTE — Progress Notes (Addendum)
 Office Visit Note  Patient: Tiffany Herrera             Date of Birth: 08-21-1990           MRN: [id number]             PCP: Georganna Skeans, MD Referring: Georganna Skeans, MD Visit Date: 05/27/2023   Subjective:  Follow-up (Patient states she needs a refill of Hydroxychloroquine. )   History of Present Illness: Tiffany Herrera is a 33 y.o. female here for follow up on her lupus. She is currently on hydroxychloroquine 200mg  BID. She has been tolerating this medication well. She denies any joint pain, skin rash, difficulty breathing, chest pain, mouth sores, worsening mentation or depressive symptoms. She has been having more fatigue lately. Denies getting sick recently. Has had no fevers. Reports no issues with urination, no foamy urine. Has an eye exam tomorrow.  Previous HPI 02/26/23 Tiffany Herrera is a 33 y.o. female here for evaluation with history of lupus diagnosed after an episode of chest inflammation and fluid accumulation in August 2023. Subsequent evaluation was positive for ANA 1:1280 speckled pattern with negative dsDNA and normal complements. The patient has been on hydroxychloroquine since the diagnosis was made at a different rheumatology office. The patient reports no significant chest issues since the initial inflammation episode.   Prior to the SLE diagnosis, the patient experienced joint pain, particularly in one foot or one hand at a time, described as severe arthritis-like pain. The foot would occasionally swell, but no swelling was noted in the hands. These symptoms began a few months before the SLE diagnosis. Prednisone was prescribed for these symptoms, which would recur a few months after each course of treatment.   The patient also reports periodic skin issues, characterized by itchy, fine bumps on the back of the legs and buttocks. This has been occurring for approximately two years. Recently, the patient noticed a lightening of the skin on the face, which was  not present before. No issues with hair loss, mouth or nose ulcers, or lymph node swelling were reported. However, the patient did have an abscess in the armpit a few months ago, which was treated with drainage and antibiotics.   The patient has been adhering to the prescribed hydroxychloroquine regimen without any reported issues. The patient also has regular eye exams due to the long-term use of hydroxychloroquine. The patient has expressed a desire to have another child in the next two years and has been reassured that the current medication is safe for pregnancy. 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    Lupus manifestations Pericardial effusion Arthralgia vs arthritis Leukopenia   Labs reviewed 03/2022 HBV/HCV neg   11/2021 RNP >8.0 Sm >8.0 SSA,SSB, Scl-70, Jo-1, centromere neg RF neg CCP neg   Imaging reviewed TTE: 11/01/21 1. Left ventricular ejection fraction, by estimation, is 65 to 70%. Left ventricular ejection fraction by PLAX is 68 %. The left ventricle has normal function. The left ventricle has no regional wall motion abnormalities. Left ventricular diastolic  parameters are consistent with Grade I diastolic dysfunction (impaired relaxation).  2. Right ventricular systolic function is normal. The right ventricular size is normal.  3. A small pericardial effusion is present. The pericardial effusion is circumferential. There is no evidence of cardiac tamponade.  4. The mitral valve is normal in structure. No evidence of mitral valve regurgitation. No evidence of mitral stenosis.  5. The aortic valve  is tricuspid. Aortic valve regurgitation is not visualized. No aortic stenosis is present.  6. The inferior vena cava is normal in size with greater than 50% respiratory variability, suggesting right atrial pressure of 3 mmHg.    Review of Systems  Constitutional:  Positive for fatigue.  HENT:   Negative for mouth sores and mouth dryness.   Eyes:  Negative for dryness.  Respiratory:  Negative for shortness of breath.   Cardiovascular:  Negative for chest pain and palpitations.  Gastrointestinal:  Negative for blood in stool, constipation and diarrhea.  Endocrine: Negative for increased urination.  Genitourinary:  Negative for involuntary urination.  Musculoskeletal:  Negative for joint pain, gait problem, joint pain, joint swelling, myalgias, muscle weakness, morning stiffness, muscle tenderness and myalgias.  Skin:  Negative for color change, rash, hair loss and sensitivity to sunlight.  Allergic/Immunologic: Negative for susceptible to infections.  Neurological:  Negative for dizziness and headaches.  Hematological:  Negative for swollen glands.  Psychiatric/Behavioral:  Negative for depressed mood and sleep disturbance. The patient is not nervous/anxious.     PMFS History:  Patient Active Problem List   Diagnosis Date Noted   Vitamin D deficiency 05/27/2023   Facial rash 02/26/2023   Pleuritic chest pain 04/09/2022   Pleural effusion 12/13/2021   Lupus (systemic lupus erythematosus) (HCC) 12/13/2021   Heart valve insufficiency 12/13/2021   Pericarditis 10/31/2021   Hyponatremia 10/31/2021   Annual physical exam 05/28/2021   Acute cough 05/23/2021    Past Medical History:  Diagnosis Date   Acute idiopathic pericarditis    Heart valve insufficiency    Lupus (systemic lupus erythematosus) (HCC)    Pericardial effusion - resolved    Pleural effusion     Family History  Problem Relation Age of Onset   Healthy Mother    Healthy Father    Diabetes Maternal Grandmother    Hypertension Maternal Aunt    Diabetes Maternal Aunt    Hypertension Cousin    History reviewed. No pertinent surgical history. Social History   Social History Narrative   Not on file    There is no immunization history on file for this patient.   Objective: Vital Signs: BP (!) 92/56 (BP  Location: Left Arm, Patient Position: Sitting, Cuff Size: Large)   Pulse 66   Resp 14   Ht 5\' 6"  (1.676 m)   Wt 201 lb (91.2 kg)   LMP 05/27/2023   BMI 32.44 kg/m    Physical Exam Constitutional:      Appearance: Normal appearance. She is not ill-appearing.  HENT:     Mouth/Throat:     Mouth: Mucous membranes are moist.  Eyes:     Conjunctiva/sclera: Conjunctivae normal.  Cardiovascular:     Rate and Rhythm: Normal rate and regular rhythm.     Heart sounds: No murmur heard.    No friction rub. No gallop.  Pulmonary:     Effort: No respiratory distress.     Breath sounds: No wheezing, rhonchi or rales.  Musculoskeletal:        General: No swelling or tenderness.     Right lower leg: No edema.     Left lower leg: No edema.  Lymphadenopathy:     Cervical: Cervical adenopathy (cervical shotty LAD more prominent on the left) present.  Skin:    Capillary Refill: Capillary refill takes less than 2 seconds.     Findings: No erythema, lesion or rash (Hypopigmentation over the malar region).  Neurological:  General: No focal deficit present.     Mental Status: She is alert and oriented to person, place, and time.     Musculoskeletal Exam:   Neck full ROM no tenderness Shoulders full ROM no tenderness or swelling Elbows full ROM no tenderness or swelling Wrists full ROM no tenderness or swelling Fingers full ROM no tenderness or swelling Hip normal internal and external rotation without pain, no tenderness to lateral hip palpation Knees full ROM no tenderness or swelling Ankles full ROM no tenderness or swelling   Investigation: No additional findings.  Imaging: No results found.  Recent Labs: Lab Results  Component Value Date   WBC 3.7 (L) 02/26/2023   HGB 12.9 02/26/2023   PLT 257 02/26/2023   NA 134 (L) 01/27/2023   K 4.5 01/27/2023   CL 103 01/27/2023   CO2 25 01/27/2023   GLUCOSE 125 (H) 01/27/2023   BUN 14 01/27/2023   CREATININE 0.81 01/27/2023    BILITOT 0.7 11/11/2022   ALKPHOS 64 11/11/2022   AST 17 11/11/2022   ALT 16 11/11/2022   PROT 8.7 (H) 11/11/2022   ALBUMIN 4.3 11/11/2022   CALCIUM 8.8 (L) 01/27/2023   GFRAA >60 05/15/2019    Speciality Comments: No specialty comments available.  Procedures:  No procedures performed Allergies: Patient has no known allergies.   Assessment / Plan:     Visit Diagnoses:   Systemic lupus erythematosus (SLE) with pericarditis, unspecified SLE type (HCC)  Is currently well controlled with the current regimen. Has no signs of active disease. Will need eye exam which she is scheduled for tomorrow. Discussed that she needs to avoid sun, as it will likely cause more skin lesions. Use of daily sunscreen SPF 50 and wide rim hats was advised. She has not had Vitamin D levels checked, so we will be obtaining this today. She will intermittently take Vitamin D OTC but has not been taking this recently.  -cw Hydroxychloroquine 200mg  BID  -Eye exam tomorrow  - PC3 and C4, Sedimentation rate, Protein / creatinine ratio, urine  Long-term use of hydroxychloroquine - Plan: CBC with Differential/Platelet, COMPLETE METABOLIC PANEL WITH GFR  Vitamin D deficiency - Plan: VITAMIN D 25 Hydroxy (Vit-D Deficiency, Fractures)  Orders: Orders Placed This Encounter  Procedures   C3 and C4   Sedimentation rate   CBC with Differential/Platelet   COMPLETE METABOLIC PANEL WITH GFR   VITAMIN D 25 Hydroxy (Vit-D Deficiency, Fractures)   Protein / creatinine ratio, urine   Meds ordered this encounter  Medications   hydroxychloroquine (PLAQUENIL) 200 MG tablet    Sig: Take 1 tablet (200 mg total) by mouth 2 (two) times daily.    Dispense:  180 tablet    Refill:  1     Follow-Up Instructions: Return in about 6 months (around 11/27/2023) for SLE on HCQ f/u 6mos.   Manuela Neptune, MD   I have examined the patient along with Dr. Hassan Rowan and personally reviewed the above  documentation.  Fuller Plan MD   Note - This record has been created using [employer].  Chart creation errors have been sought, but may not always  have been located. Such creation errors do not reflect on  the standard of medical care.

## 2023-05-28 LAB — COMPLETE METABOLIC PANEL WITH GFR
AG Ratio: 1.1 (calc) (ref 1.0–2.5)
ALT: 21 U/L (ref 6–29)
AST: 20 U/L (ref 10–30)
Albumin: 3.9 g/dL (ref 3.6–5.1)
Alkaline phosphatase (APISO): 45 U/L (ref 31–125)
BUN: 17 mg/dL (ref 7–25)
CO2: 30 mmol/L (ref 20–32)
Calcium: 9.3 mg/dL (ref 8.6–10.2)
Chloride: 104 mmol/L (ref 98–110)
Creat: 0.9 mg/dL (ref 0.50–0.97)
Globulin: 3.5 g/dL (ref 1.9–3.7)
Glucose, Bld: 93 mg/dL (ref 65–99)
Potassium: 4.6 mmol/L (ref 3.5–5.3)
Sodium: 138 mmol/L (ref 135–146)
Total Bilirubin: 0.3 mg/dL (ref 0.2–1.2)
Total Protein: 7.4 g/dL (ref 6.1–8.1)

## 2023-05-28 LAB — CBC WITH DIFFERENTIAL/PLATELET
Absolute Lymphocytes: 1099 {cells}/uL (ref 850–3900)
Absolute Monocytes: 623 {cells}/uL (ref 200–950)
Basophils Absolute: 0 {cells}/uL (ref 0–200)
Basophils Relative: 0 %
Eosinophils Absolute: 11 {cells}/uL — ABNORMAL LOW (ref 15–500)
Eosinophils Relative: 0.3 %
HCT: 37.5 % (ref 35.0–45.0)
Hemoglobin: 12.2 g/dL (ref 11.7–15.5)
MCH: 27.5 pg (ref 27.0–33.0)
MCHC: 32.5 g/dL (ref 32.0–36.0)
MCV: 84.5 fL (ref 80.0–100.0)
MPV: 10.1 fL (ref 7.5–12.5)
Monocytes Relative: 17.8 %
Neutro Abs: 1768 {cells}/uL (ref 1500–7800)
Neutrophils Relative %: 50.5 %
Platelets: 234 10*3/uL (ref 140–400)
RBC: 4.44 10*6/uL (ref 3.80–5.10)
RDW: 12.9 % (ref 11.0–15.0)
Total Lymphocyte: 31.4 %
WBC: 3.5 10*3/uL — ABNORMAL LOW (ref 3.8–10.8)

## 2023-05-28 LAB — PROTEIN / CREATININE RATIO, URINE
Creatinine, Urine: 133 mg/dL (ref 20–275)
Protein/Creat Ratio: 75 mg/g{creat} (ref 24–184)
Protein/Creatinine Ratio: 0.075 mg/mg{creat} (ref 0.024–0.184)
Total Protein, Urine: 10 mg/dL (ref 5–24)

## 2023-05-28 LAB — VITAMIN D 25 HYDROXY (VIT D DEFICIENCY, FRACTURES): Vit D, 25-Hydroxy: 34 ng/mL (ref 30–100)

## 2023-05-28 LAB — C3 AND C4
C3 Complement: 96 mg/dL (ref 83–193)
C4 Complement: 14 mg/dL — ABNORMAL LOW (ref 15–57)

## 2023-05-28 LAB — SEDIMENTATION RATE: Sed Rate: 17 mm/h (ref 0–20)

## 2023-06-30 ENCOUNTER — Ambulatory Visit (INDEPENDENT_AMBULATORY_CARE_PROVIDER_SITE_OTHER): Admitting: *Deleted

## 2023-06-30 DIAGNOSIS — Z111 Encounter for screening for respiratory tuberculosis: Secondary | ICD-10-CM

## 2023-06-30 NOTE — Progress Notes (Signed)
 Patient is in office today for a nurse visit for PPD. Patient Injection was given in the  Right arm. Patient tolerated injection well.

## 2023-07-02 ENCOUNTER — Ambulatory Visit (INDEPENDENT_AMBULATORY_CARE_PROVIDER_SITE_OTHER): Admitting: *Deleted

## 2023-07-02 ENCOUNTER — Encounter: Payer: Self-pay | Admitting: Internal Medicine

## 2023-07-02 DIAGNOSIS — Z23 Encounter for immunization: Secondary | ICD-10-CM | POA: Diagnosis not present

## 2023-07-02 DIAGNOSIS — Z1159 Encounter for screening for other viral diseases: Secondary | ICD-10-CM

## 2023-07-02 LAB — TB SKIN TEST
Induration: 0 mm
TB Skin Test: NEGATIVE

## 2023-07-02 NOTE — Progress Notes (Signed)
 Patient is in office today for a nurse visit for vaccine for school

## 2023-07-07 LAB — MEASLES/MUMPS/RUBELLA IMMUNITY
MUMPS ABS, IGG: >300 [AU]/ml (ref >10.9)
RUBEOLA AB, IGG: 185 [AU]/ml (ref >16.4)
Rubella Antibodies, IGG: 2.02 {index} (ref >0.99)

## 2023-07-07 LAB — HEPATITIS B SURFACE ANTIBODY,QUALITATIVE: Hep B Surface Ab, Qual: UNDETERMINED

## 2023-07-07 LAB — VARICELLA ZOSTER ANTIBODY, IGG: Varicella zoster IgG: REACTIVE

## 2023-07-28 ENCOUNTER — Telehealth: Payer: Self-pay

## 2023-07-28 NOTE — Telephone Encounter (Signed)
 Patient called the office to ask if it would be okay to choose the Zulane Birth Control patch with her lupus medication. Please advise

## 2023-08-07 NOTE — Telephone Encounter (Signed)
 Patient contacted the office again regarding the birth control patch. Please advise.

## 2023-08-24 ENCOUNTER — Telehealth: Payer: Self-pay | Admitting: Internal Medicine

## 2023-08-24 NOTE — Telephone Encounter (Signed)
 I spoke with Tiffany Herrera she called regarding facial pain and swelling starting since end of last week. She saw the dentist today and evaluation apparently showed a tooth or gum infection for which she was prescribed a penicillin  antibiotic. She is concerned whether this facial swelling is indicative of lupus activity triggered by an infection. So far she has not experienced any skin rashes, joint swelling, or chest pain or dyspnea symptoms that have characterized her lupus activity in the past. I recommended she should start the prescribed antibiotic. If symptoms are not starting to improve within 1-2 days, or if she starts to see any systemic signs she can contact the office. We could work in for an evaluation Thursday in that case, opening an afternoon slot if nothing available.

## 2023-09-22 ENCOUNTER — Ambulatory Visit: Payer: Medicaid Other | Admitting: Dermatology

## 2023-09-23 ENCOUNTER — Other Ambulatory Visit: Payer: Self-pay | Admitting: *Deleted

## 2023-09-23 DIAGNOSIS — M3212 Pericarditis in systemic lupus erythematosus: Secondary | ICD-10-CM

## 2023-09-23 MED ORDER — HYDROXYCHLOROQUINE SULFATE 200 MG PO TABS
200.0000 mg | ORAL_TABLET | Freq: Two times a day (BID) | ORAL | 0 refills | Status: DC
Start: 2023-09-23 — End: 2024-01-25

## 2023-09-23 NOTE — Telephone Encounter (Signed)
 Patient requested refill on PLQ.  Last Fill: 05/27/2023  Eye exam: patient states she has had it done. Received fax from eye doctor and faxed our form to be completed.   Labs: 05/27/2023 WBC 3.5, Eosinophils Absolute 11  Next Visit: 11/30/2023  Last Visit: 05/27/2023  IK:Dbduzfpr lupus erythematosus (SLE) with pericarditis, unspecified SLE type   Current Dose per office note 3/19: Hydroxychloroquine  200mg  BID   Okay to refill Plaquenil ?

## 2023-10-14 ENCOUNTER — Ambulatory Visit: Attending: Cardiovascular Disease | Admitting: Cardiovascular Disease

## 2023-10-14 ENCOUNTER — Telehealth: Payer: Self-pay | Admitting: Internal Medicine

## 2023-10-14 ENCOUNTER — Encounter: Payer: Self-pay | Admitting: Cardiovascular Disease

## 2023-10-14 VITALS — BP 100/58 | HR 63 | Ht 66.0 in | Wt 201.8 lb

## 2023-10-14 DIAGNOSIS — R0789 Other chest pain: Secondary | ICD-10-CM | POA: Insufficient documentation

## 2023-10-14 DIAGNOSIS — R079 Chest pain, unspecified: Secondary | ICD-10-CM | POA: Insufficient documentation

## 2023-10-14 NOTE — Progress Notes (Signed)
 10/14/2023 Tiffany Herrera   [birth year]  [id number]  Primary Physician Tanda Bleacher, MD Primary Cardiologist: Tiffany JINNY Lesches MD [facility] [place], [place]  HPI:  Tiffany Herrera is a 33 y.o. 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  She is referred because of atypical chest pain.  She is a patient of Dr. Katharina who saw her a year ago.  She does have a history of SLE on Plaquenil  and pericarditis in the past on colchicine  and nonsteroidals.  She has no other cardiac risk factors or medical problems.  The pain started 2 days ago.  It is positional and related to his which she turns her body in certain directions.  It does not sound anginal.  She is very active, works out 5 to 6 days a week and during her workouts she really denies chest pain.   No outpatient medications have been marked as taking for the 10/14/23 encounter (Office Visit) with Herrera Tiffany JINNY, MD.     No Known Allergies  Social History   Socioeconomic History   Marital status: Single    Spouse name: Not on file   Number of children: Not on file   Years of education: Not on file   Highest education level: Not on file  Occupational History   Not on file  Tobacco Use   Smoking status: Never    Passive exposure: Never   Smokeless tobacco: Never   Tobacco comments:    Pt smokes hooka 1-2 times per month PAP 12/18/2021  Vaping Use   Vaping status: Never Used  Substance and Sexual Activity   Alcohol use: Yes   Drug use: No   Sexual activity: Yes    Birth control/protection: None  Other Topics Concern   Not on file  Social History Narrative   Not on file   Social Drivers of Health   Financial Resource Strain: Low Risk  (09/24/2022)   Received from [facility]   Overall Financial Resource Strain (CARDIA)    Difficulty of Paying Living Expenses: Not hard at all  Food Insecurity: No Food Insecurity  (09/24/2022)   Received from [facility]   Hunger Vital Sign    Within the past 12 months, you worried that your food would run out before you got the money to buy more.: Never true    Within the past 12 months, the food you bought just didn't last and you didn't have money to get more.: Never true  Transportation Needs: No Transportation Needs (09/24/2022)   Received from [facility]    Lack of Transportation (Medical): No    Lack of Transportation (Non-Medical): No  Physical Activity: Not on file  Stress: Not on file  Social Connections: Unknown (11/28/2021)   Received from [facility]   Social Network    Social Network: Not on file  Intimate Partner Violence: Unknown (11/28/2021)   Received from [facility]   HITS    Physically Hurt: Not on file    Insult or Talk Down To: Not on file    Threaten Physical Harm: Not on file    Scream or Curse: Not on file     Review of Systems: General: negative for chills, fever, night sweats or weight changes.  Cardiovascular: negative for chest pain, dyspnea on exertion, edema, orthopnea, palpitations, paroxysmal nocturnal dyspnea or shortness of breath Dermatological: negative for rash  Respiratory: negative for cough or wheezing Urologic: negative for hematuria Abdominal: negative for nausea, vomiting, diarrhea, bright red blood per rectum, melena, or hematemesis Neurologic: negative for visual changes, syncope, or dizziness All other systems reviewed and are otherwise negative except as noted above.    Blood pressure (!) 100/58, pulse 63, height 5' 6 (1.676 m), weight 201 lb 12.8 oz (91.5 kg), SpO2 98%.  General appearance: alert and no distress Neck: no adenopathy, no carotid bruit, no JVD, supple, symmetrical, trachea midline, and thyroid not enlarged, symmetric, no tenderness/mass/nodules Lungs: clear to auscultation bilaterally Heart: regular rate and rhythm, S1, S2 normal, no murmur, click, rub or  gallop Extremities: extremities normal, atraumatic, no cyanosis or edema Pulses: 2+ and symmetric Skin: Skin color, texture, turgor normal. No rashes or lesions Neurologic: Grossly normal  EKG EKG Interpretation Date/Time:  Wednesday October 14 2023 10:07:57 EDT Ventricular Rate:  63 PR Interval:  212 QRS Duration:  70 QT Interval:  386 QTC Calculation: 395 R Axis:   83  Text Interpretation: Sinus rhythm with 1st degree A-V block When compared with ECG of 26-Jan-2023 09:02, No significant change was found Confirmed by Court Carrier [phone]) on 10/14/2023 10:13:11 AM    ASSESSMENT AND PLAN:   Atypical chest pain Ms. Tiffany Herrera is seen on as a urgent add-on today for atypical chest pain.  She is a prior patient of Dr. Katharina.  She does have a history of pericarditis treated with colchicine  and nonsteroidal type laboratory medications in the past.  She does have a history of lupus and is on Plaquenil .  She started having chest pain 2 days ago.  The pain is positional and related to certain movements.  Her exam is benign.  EKG shows no acute changes.  Her symptoms sound more musculoskeletal.  I told her to start a course of nonsteroidal and inflammatory medications and we will have her follow-up with Dr. Mona as an outpatient.     Carrier DOROTHA Court MD FACP,FACC,FAHA, [facility] 10/14/2023 10:21 AM

## 2023-10-14 NOTE — Patient Instructions (Signed)
 Medication Instructions:  Your physician recommends that you continue on your current medications as directed. Please refer to the Current Medication list given to you today.  *If you need a refill on your cardiac medications before your next appointment, please call your pharmacy*    Follow-Up: At [facility], you and your health needs are our priority.  As part of our continuing mission to provide you with exceptional heart care, our providers are all part of one team.  This team includes your primary Cardiologist (physician) and Advanced Practice Providers or APPs (Physician Assistants and Nurse Practitioners) who all work together to provide you with the care you need, when you need it.  Your next appointment:   2-3 month(s)  Provider:   Vinie JAYSON Maxcy, MD    We recommend signing up for the patient portal called MyChart.  Sign up information is provided on this After Visit Summary.  MyChart is used to connect with patients for Virtual Visits (Telemedicine).  Patients are able to view lab/test results, encounter notes, upcoming appointments, etc.  Non-urgent messages can be sent to your provider as well.   To learn more about what you can do with MyChart, go to [email or web address].

## 2023-10-14 NOTE — Telephone Encounter (Signed)
 Pt c/o of Chest Pain: STAT if active (IN THIS MOMENT) CP, including tightness, pressure, jaw pain, shoulder/upper arm/back pain, SOB, nausea, and vomiting.  1. Are you having CP right now (tightness, pressure, or discomfort)?  Yes   2. Are you experiencing any other symptoms (ex. SOB, nausea, vomiting, sweating)?  No   3. How long have you been experiencing CP?  For the past 1.5 days.  4. Is your CP continuous or coming and going?  Continuous   5. Have you taken Nitroglycerin?  No, but she took Ibuprofen  and it helped a little then wore off.?

## 2023-10-14 NOTE — Assessment & Plan Note (Signed)
 Tiffany Herrera is seen on as a urgent add-on today for atypical chest pain.  She is a prior patient of Tiffany Herrera.  She does have a history of pericarditis treated with colchicine  and nonsteroidal type laboratory medications in the past.  She does have a history of lupus and is on Plaquenil .  She started having chest pain 2 days ago.  The pain is positional and related to certain movements.  Her exam is benign.  EKG shows no acute changes.  Her symptoms sound more musculoskeletal.  I told her to start a course of nonsteroidal and inflammatory medications and we will have her follow-up with Tiffany Herrera as an outpatient.

## 2023-10-14 NOTE — Telephone Encounter (Signed)
 Spoke with Tiffany Herrera and she stated that she has been having chest pain for the last day and a half that is continuous and occurs at rest. Tiffany Herrera states when she turns from one side to the other it gets worse. Asked Tiffany Herrera if she had completed any strenuous activity within the last couple of days that could have caused muscle strain in the chest. Tiffany Herrera stated she workouts regularly but has not done anything she can think of that could have caused this pain. Tiffany Herrera states she wants to be evaluated since the last time she had chest pain she had a pericardial effusion. Tiffany Herrera has been scheduled for DOD slot with Dr. Court at 9:45 AM today (10/14/23).

## 2023-11-17 NOTE — Progress Notes (Signed)
 Office Visit Note  Patient: Tiffany Herrera             Date of Birth: 28-Mar-1990           MRN: [id number]             PCP: Tanda Bleacher, MD Referring: Tanda Bleacher, MD Visit Date: 11/30/2023   Subjective:  Joint Pain (Patient reports joint pain in hands and feet. She reports updating PLQ eye exam at [facility]. )    Discussed the use of AI scribe software for clinical note transcription with the patient, who gave verbal consent to proceed.  History of Present Illness   Tiffany Herrera is a 33 year old female with lupus on HCQ 200 mg BID who presents with joint pain in her hands.  She experiences intermittent joint pain in her hands, described as 'off and on', without associated swelling or visible changes. The pain sometimes follows extensive use of her hands but can also occur spontaneously. She does not think she has had any major flare-ups or needed to take extra medication.  She is currently taking Plaquenil  without issues and does not regularly use NSAIDs as the pain is not severe enough. No rashes, chest pain, or shortness of breath are reported.  She recently had a dental issue requiring antibiotics, which has resolved. No other recent illnesses or need for additional antibiotics.      Previous HPI 05/27/2023 Tiffany Herrera is a 33 y.o. female here for follow up on her lupus. She is currently on hydroxychloroquine  200mg  BID. She has been tolerating this medication well. She denies any joint pain, skin rash, difficulty breathing, chest pain, mouth sores, worsening mentation or depressive symptoms. She has been having more fatigue lately. Denies getting sick recently. Has had no fevers. Reports no issues with urination, no foamy urine. Has an eye exam tomorrow.   Previous HPI 02/26/23 Tiffany Herrera is a 33 y.o. female here for evaluation with history of lupus diagnosed after an episode of chest inflammation and fluid accumulation in August 2023. Subsequent  evaluation was positive for ANA 1:1280 speckled pattern with negative dsDNA and normal complements. The patient has been on hydroxychloroquine  since the diagnosis was made at a different rheumatology office. The patient reports no significant chest issues since the initial inflammation episode.   Prior to the SLE diagnosis, the patient experienced joint pain, particularly in one foot or one hand at a time, described as severe arthritis-like pain. The foot would occasionally swell, but no swelling was noted in the hands. These symptoms began a few months before the SLE diagnosis. Prednisone  was prescribed for these symptoms, which would recur a few months after each course of treatment.   The patient also reports periodic skin issues, characterized by itchy, fine bumps on the back of the legs and buttocks. This has been occurring for approximately two years. Recently, the patient noticed a lightening of the skin on the face, which was not present before. No issues with hair loss, mouth or nose ulcers, or lymph node swelling were reported. However, the patient did have an abscess in the armpit a few months ago, which was treated with drainage and antibiotics.   The patient has been adhering to the prescribed hydroxychloroquine  regimen without any reported issues. The patient also has regular eye exams due to the long-term use of hydroxychloroquine . The patient has expressed a desire to have another child in the next two years and has been reassured that the current medication is  safe for pregnancy. 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    Lupus manifestations Pericardial effusion Arthralgia vs arthritis Leukopenia   Labs reviewed 03/2022 HBV/HCV neg   11/2021 RNP >8.0 Sm >8.0 SSA,SSB, Scl-70, Jo-1, centromere neg RF neg CCP neg   Imaging reviewed TTE: 11/01/21 1. Left ventricular ejection fraction, by estimation, is 65  to 70%. Left ventricular ejection fraction by PLAX is 68 %. The left ventricle has normal function. The left ventricle has no regional wall motion abnormalities. Left ventricular diastolic  parameters are consistent with Grade I diastolic dysfunction (impaired relaxation).  2. Right ventricular systolic function is normal. The right ventricular size is normal.  3. A small pericardial effusion is present. The pericardial effusion is circumferential. There is no evidence of cardiac tamponade.  4. The mitral valve is normal in structure. No evidence of mitral valve regurgitation. No evidence of mitral stenosis.  5. The aortic valve is tricuspid. Aortic valve regurgitation is not visualized. No aortic stenosis is present.  6. The inferior vena cava is normal in size with greater than 50% respiratory variability, suggesting right atrial pressure of 3 mmHg.    Review of Systems  Constitutional:  Positive for fatigue.  HENT:  Negative for mouth sores and mouth dryness.   Eyes:  Negative for dryness.  Respiratory:  Negative for shortness of breath.   Cardiovascular:  Negative for chest pain and palpitations.  Gastrointestinal:  Negative for blood in stool, constipation and diarrhea.  Endocrine: Negative for increased urination.  Genitourinary:  Negative for involuntary urination.  Musculoskeletal:  Positive for joint pain, joint pain and muscle tenderness. Negative for gait problem, joint swelling, myalgias, muscle weakness, morning stiffness and myalgias.  Skin:  Negative for color change, rash, hair loss and sensitivity to sunlight.  Allergic/Immunologic: Negative for susceptible to infections.  Neurological:  Negative for dizziness and headaches.  Hematological:  Negative for swollen glands.  Psychiatric/Behavioral:  Negative for depressed mood and sleep disturbance. The patient is not nervous/anxious.     PMFS History:  Patient Active Problem List   Diagnosis Date Noted   Atypical chest pain  10/14/2023   Vitamin D  deficiency 05/27/2023   Facial rash 02/26/2023   Pleuritic chest pain 04/09/2022   Pleural effusion 12/13/2021   Lupus (systemic lupus erythematosus) (HCC) 12/13/2021   Heart valve insufficiency 12/13/2021   Pericarditis 10/31/2021   Hyponatremia 10/31/2021   Annual physical exam 05/28/2021   Acute cough 05/23/2021    Past Medical History:  Diagnosis Date   Acute idiopathic pericarditis    Heart valve insufficiency    Lupus (systemic lupus erythematosus) (HCC)    Pericardial effusion - resolved    Pleural effusion     Family History  Problem Relation Age of Onset   Healthy Mother    Healthy Father    Hypertension Maternal Aunt    Diabetes Maternal Aunt    Diabetes Maternal Grandmother    Hypertension Cousin    History reviewed. No pertinent surgical history. Social History   Social History Narrative   Not on file   Immunization History  Administered Date(s) Administered   PPD Test 06/30/2023   Tdap 07/02/2023     Objective: Vital Signs: BP 111/68   Pulse 77   Temp 98 F (36.7 C)   Resp 13   Ht 5' 6 (1.676 m)   Wt 205 lb (93 kg)   BMI 33.09 kg/m    Physical Exam  Eyes:     Conjunctiva/sclera: Conjunctivae normal.  Neck:     Comments: Nontender small palpable cervical adeopathy Cardiovascular:     Rate and Rhythm: Normal rate and regular rhythm.  Pulmonary:     Effort: Pulmonary effort is normal.     Breath sounds: Normal breath sounds.  Lymphadenopathy:     Cervical: Cervical adenopathy present.  Skin:    General: Skin is warm and dry.  Neurological:     Mental Status: She is alert.  Psychiatric:        Mood and Affect: Mood normal.      Musculoskeletal Exam:  Shoulders full ROM no tenderness or swelling Elbows full ROM no tenderness or swelling Wrists full ROM no tenderness or swelling Fingers full ROM no tenderness or swelling Knees full ROM no tenderness or swelling Ankles full ROM no tenderness or  swelling   Investigation: No additional findings.  Imaging: No results found.  Recent Labs: Lab Results  Component Value Date   WBC 3.4 (L) 11/30/2023   HGB 13.0 11/30/2023   PLT 250 11/30/2023   NA 140 11/30/2023   K 4.2 11/30/2023   CL 105 11/30/2023   CO2 30 11/30/2023   GLUCOSE 90 11/30/2023   BUN 14 11/30/2023   CREATININE 0.81 11/30/2023   BILITOT 0.3 05/27/2023   ALKPHOS 64 11/11/2022   AST 20 05/27/2023   ALT 21 05/27/2023   PROT 7.4 05/27/2023   ALBUMIN 4.3 11/11/2022   CALCIUM 9.1 11/30/2023   GFRAA >60 05/15/2019    Speciality Comments: PLQ Eye Exam: 05/28/2023 WNL @[facility] F/U 1 year  Called [place] [facility] to request PLQ eye exam report (11/30/2023)  Procedures:  No procedures performed Allergies: Patient has no known allergies.   Assessment / Plan:     Visit Diagnoses: Systemic lupus erythematosus (SLE) with pericarditis, unspecified SLE type (HCC) - Plan: Sedimentation rate, CBC with Differential/Platelet, Basic Metabolic Panel (BMET), C3 and C4 Intermittent joint pain without swelling or rashes. No major flare-ups. Slight decrease in complement C4, not concerning. Consider mild osteoarthritis due to age-related changes. - Recheck blood tests to monitor disease activity. - Advise NSAIDs like ibuprofen  or Aleve  as needed for pain. - Monitor for swelling, discoloration, or prolonged morning stiffness.   Long-term use of hydroxychloroquine  - Plan: CBC with Differential/Platelet, Basic Metabolic Panel (BMET) Hydroxychloroquine  eye exam note reviewed from 05/28/2023 normal recommending 1 year follow-up.  No serious interval infections or drug intolerance reported. -Checking CBC and basic metabolic panel for medication monitoring on long-term use of hydroxychloroquine    Orders: Orders Placed This Encounter  Procedures   Sedimentation rate   CBC with Differential/Platelet   Basic Metabolic Panel (BMET)   C3 and C4   No orders of  the defined types were placed in this encounter.    Follow-Up Instructions: Return in about 6 months (around 05/29/2024) for SLE on HCQ f/u 6mos.   Lonni LELON Ester, MD  Note - This record has been created using [employer].  Chart creation errors have been sought, but may not always  have been located. Such creation errors do not reflect on  the standard of medical care.

## 2023-11-30 ENCOUNTER — Encounter: Payer: Self-pay | Admitting: Internal Medicine

## 2023-11-30 ENCOUNTER — Ambulatory Visit: Attending: Internal Medicine | Admitting: Internal Medicine

## 2023-11-30 VITALS — BP 111/68 | HR 77 | Temp 98.0°F | Resp 13 | Ht 66.0 in | Wt 205.0 lb

## 2023-11-30 DIAGNOSIS — M3212 Pericarditis in systemic lupus erythematosus: Secondary | ICD-10-CM | POA: Insufficient documentation

## 2023-11-30 DIAGNOSIS — Z79899 Other long term (current) drug therapy: Secondary | ICD-10-CM | POA: Insufficient documentation

## 2023-11-30 DIAGNOSIS — E559 Vitamin D deficiency, unspecified: Secondary | ICD-10-CM | POA: Diagnosis present

## 2023-11-30 NOTE — Patient Instructions (Signed)
-   Topical antiinflammatory medicine such as diclofenac  or Voltaren  can be applied to  affected area as needed. - Oral nonsteroidal antiinflammatory drugs (NSAIDs) such as ibuprofen , aleve , celebrex, or mobic  are usually helpful for osteoarthritis. These should be taken intermittently or as needed, and always taken with food. - Compressive gloves or sleeve can be helpful to support the joint especially if hurting or swelling with certain activities. - Physical therapy referral can discuss exercises or activity modification to improve symptoms or strength if needed. - Local steroid injection is an option if symptoms become worse and not controlled by the above options.

## 2023-12-01 LAB — BASIC METABOLIC PANEL WITH GFR
BUN: 14 mg/dL (ref 7–25)
CO2: 30 mmol/L (ref 20–32)
Calcium: 9.1 mg/dL (ref 8.6–10.2)
Chloride: 105 mmol/L (ref 98–110)
Creat: 0.81 mg/dL (ref 0.50–0.97)
Glucose, Bld: 90 mg/dL (ref 65–99)
Potassium: 4.2 mmol/L (ref 3.5–5.3)
Sodium: 140 mmol/L (ref 135–146)
eGFR: 98 mL/min/1.73m2 (ref >=60)

## 2023-12-01 LAB — C3 AND C4
C3 Complement: 107 mg/dL (ref 83–193)
C4 Complement: 18 mg/dL (ref 15–57)

## 2023-12-01 LAB — CBC WITH DIFFERENTIAL/PLATELET
Absolute Lymphocytes: 1275 {cells}/uL (ref 850–3900)
Absolute Monocytes: 663 {cells}/uL (ref 200–950)
Basophils Absolute: 0 {cells}/uL (ref 0–200)
Basophils Relative: 0 %
Eosinophils Absolute: 10 {cells}/uL — ABNORMAL LOW (ref 15–500)
Eosinophils Relative: 0.3 %
HCT: 40.8 % (ref 35.0–45.0)
Hemoglobin: 13 g/dL (ref 11.7–15.5)
MCH: 28 pg (ref 27.0–33.0)
MCHC: 31.9 g/dL — ABNORMAL LOW (ref 32.0–36.0)
MCV: 87.7 fL (ref 80.0–100.0)
MPV: 9.8 fL (ref 7.5–12.5)
Monocytes Relative: 19.5 %
Neutro Abs: 1452 {cells}/uL — ABNORMAL LOW (ref 1500–7800)
Neutrophils Relative %: 42.7 %
Platelets: 250 Thousand/uL (ref 140–400)
RBC: 4.65 Million/uL (ref 3.80–5.10)
RDW: 13 % (ref 11.0–15.0)
Total Lymphocyte: 37.5 %
WBC: 3.4 Thousand/uL — ABNORMAL LOW (ref 3.8–10.8)

## 2023-12-01 LAB — SEDIMENTATION RATE: Sed Rate: 17 mm/h (ref 0–20)

## 2023-12-17 ENCOUNTER — Ambulatory Visit: Attending: Internal Medicine | Admitting: Internal Medicine

## 2023-12-22 ENCOUNTER — Other Ambulatory Visit: Payer: Self-pay

## 2023-12-22 ENCOUNTER — Ambulatory Visit
Admission: EM | Admit: 2023-12-22 | Discharge: 2023-12-22 | Disposition: A | Discharge status: Home / Self Care | Attending: Family Medicine | Admitting: Family Medicine

## 2023-12-22 DIAGNOSIS — Z113 Encounter for screening for infections with a predominantly sexual mode of transmission: Secondary | ICD-10-CM | POA: Diagnosis not present

## 2023-12-22 DIAGNOSIS — N898 Other specified noninflammatory disorders of vagina: Secondary | ICD-10-CM | POA: Insufficient documentation

## 2023-12-22 LAB — POCT URINE DIPSTICK
Bilirubin, UA: NEGATIVE
Blood, UA: NEGATIVE
Glucose, UA: NEGATIVE mg/dL
Leukocytes, UA: NEGATIVE
Nitrite, UA: NEGATIVE
POC PROTEIN,UA: 30 — AB
Spec Grav, UA: >=1.03 — AB (ref 1.010–1.025)
Urobilinogen, UA: 1 U/dL
pH, UA: 6 (ref 5.0–8.0)

## 2023-12-22 LAB — POCT URINE PREGNANCY: Preg Test, Ur: NEGATIVE

## 2023-12-22 NOTE — ED Triage Notes (Signed)
 Pt c/o dysuria, creamy vaginal dischargex3d

## 2023-12-22 NOTE — ED Provider Notes (Signed)
 [facility]    CSN: [id number] Arrival date & time: 12/22/23  1828      History   Chief Complaint Chief Complaint  Patient presents with   Dysuria   Vaginal Discharge    HPI Tiffany Herrera is a 33 y.o. female presents for vaginal discharge.  Patient reports 3 days of a creamy vaginal discharge that is not pruritic nor malodorous.  Had 1 day of dysuria but no additional episodes.  No hematuria, urgency, frequency, fevers, vomiting or back pain.  No known STD exposure but would like screening.  Denies history of BV or yeast infections.  No OTC medications have been used since onset.  No other concerns at this time   Dysuria Associated symptoms: vaginal discharge   Vaginal Discharge Associated symptoms: dysuria     Past Medical History:  Diagnosis Date   Acute idiopathic pericarditis    Heart valve insufficiency    Lupus (systemic lupus erythematosus) (HCC)    Pericardial effusion - resolved    Pleural effusion     Patient Active Problem List   Diagnosis Date Noted   Atypical chest pain 10/14/2023   Vitamin D  deficiency 05/27/2023   Facial rash 02/26/2023   Pleuritic chest pain 04/09/2022   Pleural effusion 12/13/2021   Lupus (systemic lupus erythematosus) (HCC) 12/13/2021   Heart valve insufficiency 12/13/2021   Pericarditis 10/31/2021   Hyponatremia 10/31/2021   Annual physical exam 05/28/2021   Acute cough 05/23/2021    History reviewed. No pertinent surgical history.  OB History   No obstetric history on file.      Home Medications    Prior to Admission medications   Medication Sig Start Date End Date Taking? Authorizing Provider  hydroxychloroquine  (PLAQUENIL ) 200 MG tablet Take 1 tablet (200 mg total) by mouth 2 (two) times daily. 09/23/23   Rice, Lonni ORN, MD  albuterol  (PROVENTIL  HFA;VENTOLIN  HFA) 108 (90 Base) MCG/ACT inhaler Inhale 2 puffs into the lungs every 4 (four) hours as needed for shortness of breath (cough). 05/14/18  09/21/18  Baxter Drivers, MD    Family History Family History  Problem Relation Age of Onset   Healthy Mother    Healthy Father    Hypertension Maternal Aunt    Diabetes Maternal Aunt    Diabetes Maternal Grandmother    Hypertension Cousin     Social History Social History   Tobacco Use   Smoking status: Never    Passive exposure: Never   Smokeless tobacco: Never   Tobacco comments:    Pt smokes hooka 1-2 times per month PAP 12/18/2021  Vaping Use   Vaping status: Never Used  Substance Use Topics   Alcohol use: Yes    Comment: occ   Drug use: No     Allergies   Patient has no known allergies.   Review of Systems Review of Systems  Genitourinary:  Positive for dysuria and vaginal discharge.     Physical Exam Triage Vital Signs ED Triage Vitals  Encounter Vitals Group     BP 12/22/23 1843 114/75     Girls Systolic BP Percentile --      Girls Diastolic BP Percentile --      Boys Systolic BP Percentile --      Boys Diastolic BP Percentile --      Pulse Rate 12/22/23 1843 83     Resp --      Temp 12/22/23 1843 98.9 F (37.2 C)     Temp Source 12/22/23 1843 Oral  SpO2 12/22/23 1843 98 %     Weight --      Height --      Head Circumference --      Peak Flow --      Pain Score 12/22/23 1842 0     Pain Loc --      Pain Education --      Exclude from Growth Chart --    No data found.  Updated Vital Signs BP 114/75   Pulse 83   Temp 98.9 F (37.2 C) (Oral)   LMP 12/09/2023   SpO2 98%   Visual Acuity Right Eye Distance:   Left Eye Distance:   Bilateral Distance:    Right Eye Near:   Left Eye Near:    Bilateral Near:     Physical Exam Vitals and nursing note reviewed.  Constitutional:      Appearance: Normal appearance.  HENT:     Head: Normocephalic and atraumatic.  Eyes:     Pupils: Pupils are equal, round, and reactive to light.  Cardiovascular:     Rate and Rhythm: Normal rate.  Pulmonary:     Effort: Pulmonary effort is normal.   Skin:    General: Skin is warm and dry.  Neurological:     General: No focal deficit present.     Mental Status: She is alert and oriented to person, place, and time.  Psychiatric:        Mood and Affect: Mood normal.        Behavior: Behavior normal.      UC Treatments / Results  Labs (all labs ordered are listed, but only abnormal results are displayed) Labs Reviewed  POCT URINE DIPSTICK - Abnormal; Notable for the following components:      Result Value   Clarity, UA cloudy (*)    Ketones, POC UA trace (5) (*)    Spec Grav, UA >=1.030 (*)    POC PROTEIN,UA =30 (*)    All other components within normal limits  RPR  HIV ANTIBODY (ROUTINE TESTING W REFLEX)  POCT URINE PREGNANCY  CERVICOVAGINAL ANCILLARY ONLY    EKG   Radiology No results found.  Procedures Procedures (including critical care time)  Medications Ordered in UC Medications - No data to display  Initial Impression / Assessment and Plan / UC Course  I have reviewed the triage vital signs and the nursing notes.  Pertinent labs & imaging results that were available during my care of the patient were reviewed by me and considered in my medical decision making (see chart for details).     Reviewed exam and symptoms with patient.  No red flags.  Urine negative for UTI.  Vaginal swab/STD testing is ordered and will contact for any positive results.  Will await results prior to initiating treatment.  PCP or gynecology follow-up if symptoms do not improve.  ER precautions reviewed Final Clinical Impressions(s) / UC Diagnoses   Final diagnoses:  Vaginal discharge  Screening examination for STD (sexually transmitted disease)     Discharge Instructions      The clinical contact you with results of the testing done today if positive.  Please follow-up with your PCP or gynecologist if your symptoms do not improve.  I hope you feel better soon!     ED Prescriptions   None    PDMP not reviewed this  encounter.   Loreda Myla SAUNDERS, NP 12/22/23 1942

## 2023-12-22 NOTE — Discharge Instructions (Addendum)
 The clinical contact you with results of the testing done today if positive.  Please follow-up with your PCP or gynecologist if your symptoms do not improve.  I hope you feel better soon!

## 2023-12-23 LAB — CERVICOVAGINAL ANCILLARY ONLY
Bacterial Vaginitis (gardnerella): POSITIVE — AB
Candida Glabrata: NEGATIVE
Candida Vaginitis: POSITIVE — AB
Chlamydia: NEGATIVE
Comment: NEGATIVE
Comment: NEGATIVE
Comment: NEGATIVE
Comment: NEGATIVE
Comment: NEGATIVE
Comment: NORMAL
Neisseria Gonorrhea: NEGATIVE
Trichomonas: NEGATIVE

## 2023-12-24 ENCOUNTER — Telehealth: Payer: Self-pay

## 2023-12-24 ENCOUNTER — Telehealth: Payer: Self-pay | Admitting: Urgent Care

## 2023-12-24 MED ORDER — METRONIDAZOLE 500 MG PO TABS: 500.0000 mg | ORAL_TABLET | Freq: Two times a day (BID) | ORAL | 0 refills | Status: AC

## 2023-12-24 MED ORDER — FLUCONAZOLE 150 MG PO TABS: 150.0000 mg | ORAL_TABLET | ORAL | 0 refills | Status: AC

## 2023-12-24 NOTE — Telephone Encounter (Signed)
 Prescription sent to her pharmacy on file for BV and yeast infection.  This is metronidazole  and fluconazole .  Please notify patient.

## 2023-12-25 ENCOUNTER — Ambulatory Visit (HOSPITAL_COMMUNITY): Payer: Self-pay

## 2023-12-25 LAB — RPR: RPR Ser Ql: NONREACTIVE

## 2023-12-25 LAB — HIV ANTIBODY (ROUTINE TESTING W REFLEX): HIV Screen 4th Generation wRfx: NONREACTIVE

## 2024-01-22 ENCOUNTER — Other Ambulatory Visit: Payer: Self-pay | Admitting: Internal Medicine

## 2024-01-22 DIAGNOSIS — M3212 Pericarditis in systemic lupus erythematosus: Secondary | ICD-10-CM

## 2024-01-25 ENCOUNTER — Ambulatory Visit: Payer: Self-pay | Admitting: Internal Medicine

## 2024-01-25 NOTE — Telephone Encounter (Signed)
 Last Fill: 09/23/2023  Eye exam: 05/28/2023 WNL   Labs: 11/30/2023 CBC and BMP WBC 3.4 MCHC 31.9 Neutro Abs 1,452 Eosinophils Absolute 10  Next Visit: 05/30/2024  Last Visit: 11/30/2023  IK:Dbduzfpr lupus erythematosus (SLE) with pericarditis, unspecified SLE type (HCC)   Current Dose per office note 11/30/2023: dose not mentioned  Okay to refill Plaquenil ?

## 2024-02-09 NOTE — Telephone Encounter (Signed)
 A  Outgoing -- --   Prescription sent to her pharmacy on file for BV and yeast infection. This is metronidazole  and fluconazole 

## 2024-02-16 ENCOUNTER — Encounter: Payer: Self-pay | Admitting: Internal Medicine

## 2024-03-24 ENCOUNTER — Ambulatory Visit (INDEPENDENT_AMBULATORY_CARE_PROVIDER_SITE_OTHER): Payer: Self-pay | Admitting: Physician Assistant

## 2024-03-24 ENCOUNTER — Encounter: Payer: Self-pay | Admitting: Physician Assistant

## 2024-03-24 ENCOUNTER — Other Ambulatory Visit (HOSPITAL_COMMUNITY)
Admission: RE | Admit: 2024-03-24 | Discharge: 2024-03-24 | Disposition: A | Source: Ambulatory Visit | Attending: Physician Assistant | Admitting: Physician Assistant

## 2024-03-24 VITALS — BP 97/53 | HR 58 | Ht 66.0 in | Wt 204.7 lb

## 2024-03-24 DIAGNOSIS — Z0000 Encounter for general adult medical examination without abnormal findings: Secondary | ICD-10-CM

## 2024-03-24 DIAGNOSIS — Z113 Encounter for screening for infections with a predominantly sexual mode of transmission: Secondary | ICD-10-CM

## 2024-03-24 DIAGNOSIS — Z01419 Encounter for gynecological examination (general) (routine) without abnormal findings: Secondary | ICD-10-CM | POA: Diagnosis not present

## 2024-03-24 NOTE — Progress Notes (Signed)
 Pt presents to annual.  Pt wants all std testing. Pt has no questions or concerns at this time.

## 2024-03-24 NOTE — Progress Notes (Signed)
 "  ANNUAL EXAM Patient name: Tiffany Herrera MRN [id number]  Date of birth: 1990-05-18 Chief Complaint:   No chief complaint on file.  History of Present Illness:   Tiffany Herrera is a 34 y.o. [phone] female being seen today for a routine annual exam.   Current complaints: None  Patient's last menstrual period was 03/13/2024 (approximate).   The pregnancy intention screening data noted above was reviewed. Potential methods of contraception were discussed. The patient elected to proceed with No data recorded.   Last pap 11/11/22. Results were: NILM HPV negative,  H/O abnormal pap: no Last mammogram: Never previously done due to age. Results were: N/A. Family h/o breast cancer: No Last colonoscopy: Never previously done due to age. Results were: N/A. Family h/o colorectal cancer: No STI screening: Accepts Contraception: None PMH: SLE     11/11/2022    2:03 PM 09/10/2022    3:32 PM 11/20/2021   11:12 AM 05/28/2021    2:31 PM 05/23/2021    3:33 PM  Depression screen PHQ 2/9  Decreased Interest 0  0 0 0  Down, Depressed, Hopeless 0  0 0 0  PHQ - 2 Score 0  0 0 0  Altered sleeping 0  0 0 0  Tired, decreased energy 0  0 0 1  Change in appetite 0  0 0 0  Feeling bad or failure about yourself  0  0 0 0  Trouble concentrating 0  0 0 0  Moving slowly or fidgety/restless 0  0 0 0  Suicidal thoughts 0  0 0 0  PHQ-9 Score 0   0  0  1   Difficult doing work/chores Not difficult at all Not difficult at all Not difficult at all Not difficult at all      Data saved with a previous flowsheet row definition        11/11/2022    2:03 PM 05/28/2021    2:31 PM 05/23/2021    3:33 PM  GAD 7 : Generalized Anxiety Score  Nervous, Anxious, on Edge 0 0 0  Control/stop worrying 0 0 0  Worry too much - different things 0 0 0  Trouble relaxing 0 0 0  Restless 0 0 0  Easily annoyed or irritable 0 0 0  Afraid - awful might happen 0 0 0  Total GAD 7 Score 0 0 0  Anxiety Difficulty Not difficult at all  Not difficult at all      Review of Systems:   Pertinent items are noted in HPI Denies any headaches, blurred vision, fatigue, shortness of breath, chest pain, abdominal pain, abnormal vaginal discharge/itching/odor/irritation, problems with periods, bowel movements, urination, or intercourse unless otherwise stated above. Pertinent History Reviewed:  Reviewed past medical,surgical, social and family history.  Reviewed problem list, medications and allergies. Physical Assessment:   Vitals:   03/24/24 1112  BP: (!) 97/53  Pulse: (!) 58  Weight: 204 lb 11.2 oz (92.9 kg)  Height: 5' 6 (1.676 m)  Body mass index is 33.04 kg/m.        Physical Examination:   General appearance - well appearing, and in no distress  Mental status - alert, oriented to person, place, and time  Psych:  She has a normal mood and affect  Skin - warm and dry, normal color, no suspicious lesions noted  Chest - effort normal, all lung fields clear to auscultation bilaterally  Heart - normal rate and regular rhythm  Neck:  midline trachea, no thyromegaly or  nodules  Breasts - breasts appear normal, no suspicious masses, no skin or nipple changes or  axillary nodes  Abdomen - soft, nontender, nondistended, no masses or organomegaly  Pelvic - VULVA: normal appearing vulva with no masses, tenderness or lesions  VAGINA: normal appearing vagina with normal color and discharge, no lesions  CERVIX: normal appearing cervix without discharge or lesions, no CMT  UTERUS: uterus is felt to be normal size, shape, consistency and nontender   ADNEXA: No adnexal masses or tenderness noted.  Extremities:  No swelling or varicosities noted  Chaperone present for exam  No results found for this or any previous visit (from the past 24 hours).  Assessment & Plan:   1. Annual wellness visit (Primary) - Cervical cancer screening: Discussed guidelines. Pap with HPV due 2027 - GC/CT: accepts - Birth Control: Patient politely  declines-will speak to rheumatology re: SLE - Breast Health: Encouraged self breast awareness/SBE. Teaching provided.  - Mammogram: @ 35yo, or sooner if problems - Colonoscopy: @ 35yo, or sooner if problems - F/U 12 months and prn   2. Routine screening for STI (sexually transmitted infection) - Cervicovaginal ancillary only( [place]) - HIV antibody (with reflex) - RPR - Hepatitis C Antibody - Hepatitis B Surface AntiGEN  Orders Placed This Encounter  Procedures   HIV antibody (with reflex)   RPR   Hepatitis C Antibody   Hepatitis B Surface AntiGEN    Meds: No orders of the defined types were placed in this encounter.   Follow-up: No follow-ups on file.  Jannessa Ogden E Constantina Laseter, PA-C 03/24/2024 12:35 PM  "

## 2024-03-25 LAB — HEPATITIS C ANTIBODY: Hep C Virus Ab: NONREACTIVE

## 2024-03-25 LAB — HIV ANTIBODY (ROUTINE TESTING W REFLEX): HIV Screen 4th Generation wRfx: NONREACTIVE

## 2024-03-25 LAB — SYPHILIS: RPR W/REFLEX TO RPR TITER AND TREPONEMAL ANTIBODIES, TRADITIONAL SCREENING AND DIAGNOSIS ALGORITHM: RPR Ser Ql: NONREACTIVE

## 2024-03-25 LAB — HEPATITIS B SURFACE ANTIGEN: Hepatitis B Surface Ag: NEGATIVE

## 2024-03-26 ENCOUNTER — Ambulatory Visit: Payer: Self-pay | Admitting: Physician Assistant

## 2024-03-29 ENCOUNTER — Other Ambulatory Visit: Payer: Self-pay

## 2024-03-29 LAB — CERVICOVAGINAL ANCILLARY ONLY
Bacterial Vaginitis (gardnerella): POSITIVE — AB
Chlamydia: NEGATIVE
Comment: NEGATIVE
Comment: NEGATIVE
Comment: NEGATIVE
Comment: NORMAL
Neisseria Gonorrhea: NEGATIVE
Trichomonas: NEGATIVE

## 2024-03-29 MED ORDER — METRONIDAZOLE 500 MG PO TABS: 500.0000 mg | ORAL_TABLET | Freq: Two times a day (BID) | ORAL | 0 refills | Status: AC

## 2024-05-30 ENCOUNTER — Ambulatory Visit: Admitting: Internal Medicine
